# Patient Record
Sex: Female | Born: 1940 | Race: White | Hispanic: No | Smoking: Former smoker
Health system: Southern US, Community
[De-identification: ages and names within clinical notes are randomized; demographics above are authoritative.]

## PROBLEM LIST (undated history)

## (undated) DIAGNOSIS — E785 Hyperlipidemia, unspecified: Secondary | ICD-10-CM

## (undated) DIAGNOSIS — I10 Essential (primary) hypertension: Secondary | ICD-10-CM

## (undated) DIAGNOSIS — Z923 Personal history of irradiation: Secondary | ICD-10-CM

## (undated) DIAGNOSIS — IMO0002 Reserved for concepts with insufficient information to code with codable children: Secondary | ICD-10-CM

## (undated) DIAGNOSIS — R519 Headache, unspecified: Secondary | ICD-10-CM

## (undated) DIAGNOSIS — R87619 Unspecified abnormal cytological findings in specimens from cervix uteri: Secondary | ICD-10-CM

## (undated) DIAGNOSIS — C50919 Malignant neoplasm of unspecified site of unspecified female breast: Secondary | ICD-10-CM

## (undated) DIAGNOSIS — I2699 Other pulmonary embolism without acute cor pulmonale: Secondary | ICD-10-CM

## (undated) DIAGNOSIS — R0600 Dyspnea, unspecified: Secondary | ICD-10-CM

## (undated) DIAGNOSIS — R51 Headache: Secondary | ICD-10-CM

## (undated) DIAGNOSIS — M1990 Unspecified osteoarthritis, unspecified site: Secondary | ICD-10-CM

## (undated) DIAGNOSIS — J449 Chronic obstructive pulmonary disease, unspecified: Secondary | ICD-10-CM

## (undated) DIAGNOSIS — J45909 Unspecified asthma, uncomplicated: Secondary | ICD-10-CM

## (undated) HISTORY — PX: CATARACT EXTRACTION, BILATERAL: SHX1313

## (undated) HISTORY — PX: KNEE CARTILAGE SURGERY: SHX688

## (undated) HISTORY — DX: Hyperlipidemia, unspecified: E78.5

## (undated) HISTORY — DX: Malignant neoplasm of unspecified site of unspecified female breast: C50.919

## (undated) HISTORY — DX: Reserved for concepts with insufficient information to code with codable children: IMO0002

## (undated) HISTORY — DX: Essential (primary) hypertension: I10

## (undated) HISTORY — DX: Chronic obstructive pulmonary disease, unspecified: J44.9

## (undated) HISTORY — DX: Unspecified abnormal cytological findings in specimens from cervix uteri: R87.619

---
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
## 1998-08-21 DIAGNOSIS — C50919 Malignant neoplasm of unspecified site of unspecified female breast: Secondary | ICD-10-CM

## 1998-08-21 DIAGNOSIS — Z923 Personal history of irradiation: Secondary | ICD-10-CM

## 1998-08-21 HISTORY — DX: Personal history of irradiation: Z92.3

## 1998-08-21 HISTORY — PX: BREAST LUMPECTOMY: SHX2

## 1998-08-21 HISTORY — DX: Malignant neoplasm of unspecified site of unspecified female breast: C50.919

## 1999-06-24 ENCOUNTER — Encounter: Payer: Self-pay | Admitting: General Surgery

## 1999-06-24 ENCOUNTER — Ambulatory Visit (HOSPITAL_BASED_OUTPATIENT_CLINIC_OR_DEPARTMENT_OTHER): Admission: RE | Admit: 1999-06-24 | Discharge: 1999-06-24 | Payer: Self-pay | Admitting: General Surgery

## 1999-07-01 ENCOUNTER — Encounter: Payer: Self-pay | Admitting: General Surgery

## 1999-07-05 ENCOUNTER — Ambulatory Visit (HOSPITAL_COMMUNITY): Admission: RE | Admit: 1999-07-05 | Discharge: 1999-07-06 | Payer: Self-pay | Admitting: General Surgery

## 1999-07-05 ENCOUNTER — Encounter: Payer: Self-pay | Admitting: General Surgery

## 1999-07-05 ENCOUNTER — Encounter (INDEPENDENT_AMBULATORY_CARE_PROVIDER_SITE_OTHER): Payer: Self-pay | Admitting: *Deleted

## 1999-07-18 ENCOUNTER — Encounter: Admission: RE | Admit: 1999-07-18 | Discharge: 1999-10-16 | Payer: Self-pay | Admitting: Radiation Oncology

## 2000-01-10 ENCOUNTER — Encounter: Payer: Self-pay | Admitting: General Surgery

## 2000-01-10 ENCOUNTER — Encounter: Admission: RE | Admit: 2000-01-10 | Discharge: 2000-01-10 | Payer: Self-pay | Admitting: General Surgery

## 2000-06-22 ENCOUNTER — Encounter: Admission: RE | Admit: 2000-06-22 | Discharge: 2000-06-22 | Payer: Self-pay | Admitting: General Surgery

## 2000-06-22 ENCOUNTER — Encounter: Payer: Self-pay | Admitting: General Surgery

## 2000-12-03 ENCOUNTER — Encounter: Payer: Self-pay | Admitting: General Surgery

## 2000-12-03 ENCOUNTER — Encounter: Admission: RE | Admit: 2000-12-03 | Discharge: 2000-12-03 | Payer: Self-pay | Admitting: General Surgery

## 2001-06-04 ENCOUNTER — Encounter: Payer: Self-pay | Admitting: General Surgery

## 2001-06-04 ENCOUNTER — Encounter: Admission: RE | Admit: 2001-06-04 | Discharge: 2001-06-04 | Payer: Self-pay | Admitting: General Surgery

## 2001-07-08 DIAGNOSIS — C4491 Basal cell carcinoma of skin, unspecified: Secondary | ICD-10-CM

## 2001-07-08 HISTORY — DX: Basal cell carcinoma of skin, unspecified: C44.91

## 2001-12-04 ENCOUNTER — Encounter: Admission: RE | Admit: 2001-12-04 | Discharge: 2001-12-04 | Payer: Self-pay | Admitting: General Surgery

## 2001-12-04 ENCOUNTER — Encounter: Payer: Self-pay | Admitting: General Surgery

## 2002-12-10 ENCOUNTER — Encounter: Admission: RE | Admit: 2002-12-10 | Discharge: 2002-12-10 | Payer: Self-pay | Admitting: General Surgery

## 2002-12-10 ENCOUNTER — Encounter: Payer: Self-pay | Admitting: General Surgery

## 2003-12-29 ENCOUNTER — Encounter: Admission: RE | Admit: 2003-12-29 | Discharge: 2003-12-29 | Payer: Self-pay | Admitting: General Surgery

## 2005-03-17 ENCOUNTER — Encounter: Admission: RE | Admit: 2005-03-17 | Discharge: 2005-03-17 | Payer: Self-pay | Admitting: General Surgery

## 2006-03-19 ENCOUNTER — Encounter: Admission: RE | Admit: 2006-03-19 | Discharge: 2006-03-19 | Payer: Self-pay | Admitting: General Surgery

## 2007-03-21 ENCOUNTER — Encounter: Admission: RE | Admit: 2007-03-21 | Discharge: 2007-03-21 | Payer: Self-pay | Admitting: General Surgery

## 2008-03-25 ENCOUNTER — Encounter: Admission: RE | Admit: 2008-03-25 | Discharge: 2008-03-25 | Payer: Self-pay | Admitting: *Deleted

## 2009-03-29 ENCOUNTER — Encounter: Admission: RE | Admit: 2009-03-29 | Discharge: 2009-03-29 | Payer: Self-pay | Admitting: *Deleted

## 2010-04-01 ENCOUNTER — Encounter: Admission: RE | Admit: 2010-04-01 | Discharge: 2010-04-01 | Payer: Self-pay | Admitting: *Deleted

## 2011-03-22 ENCOUNTER — Other Ambulatory Visit: Payer: Self-pay | Admitting: *Deleted

## 2011-03-22 DIAGNOSIS — Z1231 Encounter for screening mammogram for malignant neoplasm of breast: Secondary | ICD-10-CM

## 2011-04-06 ENCOUNTER — Ambulatory Visit
Admission: RE | Admit: 2011-04-06 | Discharge: 2011-04-06 | Disposition: A | Discharge status: Home / Self Care | Payer: Medicare Other | Source: Ambulatory Visit | Attending: *Deleted | Admitting: *Deleted

## 2011-04-06 DIAGNOSIS — Z1231 Encounter for screening mammogram for malignant neoplasm of breast: Secondary | ICD-10-CM

## 2011-06-29 ENCOUNTER — Ambulatory Visit: Payer: Medicare Other | Admitting: Physician Assistant

## 2011-06-29 ENCOUNTER — Encounter: Payer: Self-pay | Admitting: *Deleted

## 2011-06-29 VITALS — BP 140/69 | HR 92 | Temp 98.9°F | Ht 64.25 in | Wt 137.0 lb

## 2011-06-29 DIAGNOSIS — R87619 Unspecified abnormal cytological findings in specimens from cervix uteri: Secondary | ICD-10-CM

## 2011-06-29 HISTORY — DX: Unspecified abnormal cytological findings in specimens from cervix uteri: R87.619

## 2011-06-29 NOTE — Progress Notes (Signed)
Pt rescheduled

## 2011-07-06 ENCOUNTER — Ambulatory Visit: Payer: Medicare Other | Admitting: Physician Assistant

## 2011-11-06 ENCOUNTER — Encounter: Payer: Self-pay | Admitting: Cardiovascular Disease

## 2011-11-08 ENCOUNTER — Ambulatory Visit (INDEPENDENT_AMBULATORY_CARE_PROVIDER_SITE_OTHER): Payer: Medicare Other | Admitting: Cardiovascular Disease

## 2011-11-08 ENCOUNTER — Encounter: Payer: Self-pay | Admitting: Cardiovascular Disease

## 2011-11-08 VITALS — BP 106/42 | HR 80 | Resp 14 | Ht 64.0 in | Wt 134.0 lb

## 2011-11-08 DIAGNOSIS — I10 Essential (primary) hypertension: Secondary | ICD-10-CM

## 2011-11-08 DIAGNOSIS — M79622 Pain in left upper arm: Secondary | ICD-10-CM

## 2011-11-08 DIAGNOSIS — C50919 Malignant neoplasm of unspecified site of unspecified female breast: Secondary | ICD-10-CM

## 2011-11-08 DIAGNOSIS — M79609 Pain in unspecified limb: Secondary | ICD-10-CM

## 2011-11-08 DIAGNOSIS — R0789 Other chest pain: Secondary | ICD-10-CM

## 2011-11-08 NOTE — Assessment & Plan Note (Signed)
Are presents with an atypical episode of chest pain/arm pain. The episode was a sharp pain in her left arm that lasted for a few seconds. This occurred off and on for several hours. She has a history of cigarette smoking in the past and has a history of cardiac disease. She's not been exercising regular; no fevers any exertional component.  She has a bad knee and so she doesn't think she'll be able to perform a stress test very well. In my opinion I think it is a very low likelihood that she has any cardiac disease.  We will see her back in the office in 2-3 months. She's had any recurrent episodes of chest pain and we'll proceed with a Lexiscan Myoview study. Otherwise we'll see her on an as-needed basis to

## 2011-11-08 NOTE — Patient Instructions (Signed)
Your physician recommends that you schedule a follow-up appointment in: 2 months or sooner if problems persist.

## 2011-11-08 NOTE — Progress Notes (Signed)
Angela Parrish Date of Birth  10-01-1940 [place]  [id number]    [place]   [place], [place]  [address]    [place], [place]  [address] [phone]  Fax  [phone]  [phone]  Fax [phone]  Problem List: 1. Left arm pain  History of Present Illness:  Angela Parrish is a 71 year old female who presents today for left arm pain. The pain was centered in her upper arm. It occurred spontaneously. It would last for several seconds and then resolve. This occurred off and on for several hours.  The pain 8 days ago and has recurred several times since that time.  She has not done any strenuous exercise since that time.     She works at [employer]. She does use her arms quite a bit during the course of her normal workday but has done the same job for many years and has never had any similar episodes of arm pain.   She has noticed some variable BP.  She eats salty foods and eats fast foods frequently.  She does not get any regular exercise because of knee surgery.  She used to walk 6 months ago.  Current Outpatient Prescriptions  Medication Sig Dispense Refill  . albuterol (PROVENTIL HFA;VENTOLIN HFA) 108 (90 BASE) MCG/ACT inhaler Inhale 2 puffs into the lungs every 6 (six) hours as needed.      Marland Kitchen albuterol (PROVENTIL) (2.5 MG/3ML) 0.083% nebulizer solution Take 2.5 mg by nebulization every 6 (six) hours as needed.      . ALPRAZolam (XANAX) 0.5 MG tablet Take 0.5 mg by mouth at bedtime as needed.      . chlorpheniramine-HYDROcodone (TUSSIONEX) 10-8 MG/5ML LQCR Take 5 mLs by mouth every 12 (twelve) hours as needed.      . fexofenadine-pseudoephedrine (ALLEGRA-D) 60-120 MG per tablet Take 1 tablet by mouth 2 (two) times daily.      Marland Kitchen lisinopril (PRINIVIL,ZESTRIL) 5 MG tablet Take 5 mg by mouth daily.        No Known Allergies  Past Medical History  Diagnosis Date  . Abnormal Pap smear     has had a colposcopy in the past  . Breast  cancer 2000    lumpectomy  . Abnormal Pap smear of cervix 06/29/2011  . Hypertension     Past Surgical History  Procedure Date  . Breast lumpectomy 2000    left breast    History  Smoking status  . Former Smoker  . Types: Cigarettes  . Quit date: 08/14/2007  Smokeless tobacco  . Never Used    History  Alcohol Use No    Family History  Problem Relation Age of Onset  . Cancer Father     lung cancer  . Heart disease Father   . Colon cancer Mother   . Heart disease Mother     Reviw of Systems:  Reviewed in the HPI.  All other systems are negative.  Physical Exam: Blood pressure 106/42, pulse 80, resp. rate 14, height 5\' 4"  (1.626 m), weight 134 lb (60.782 kg). General: Well developed, well nourished, in no acute distress.  Head: Normocephalic, atraumatic, sclera non-icteric, mucus membranes are moist,   Neck: Supple. Carotids are 2 + without bruits. No JVD  Lungs: Clear bilaterally to auscultation.  Heart: regular rate.  normal  S1 S2. No murmurs, gallops or rubs.  Abdomen: Soft, non-tender, non-distended with normal bowel sounds. No hepatomegaly. No rebound/guarding.  No masses.  Msk:  Strength and tone are normal  Extremities: No clubbing or cyanosis. No edema.  Distal pedal pulses are 2+ and equal bilaterally.  Neuro: Alert and oriented X 3. Moves all extremities spontaneously.  Psych:  Responds to questions appropriately with a normal affect.  ECG: NSR., No ST or T wave changes. EKG is completely normal.  Assessment / Plan:

## 2012-01-08 ENCOUNTER — Ambulatory Visit: Payer: Medicare Other | Admitting: Cardiovascular Disease

## 2012-03-06 ENCOUNTER — Other Ambulatory Visit: Payer: Self-pay | Admitting: *Deleted

## 2012-03-06 DIAGNOSIS — Z1231 Encounter for screening mammogram for malignant neoplasm of breast: Secondary | ICD-10-CM

## 2012-04-10 ENCOUNTER — Ambulatory Visit
Admission: RE | Admit: 2012-04-10 | Discharge: 2012-04-10 | Disposition: A | Discharge status: Home / Self Care | Payer: Medicare Other | Source: Ambulatory Visit | Attending: *Deleted | Admitting: *Deleted

## 2012-04-10 DIAGNOSIS — Z1231 Encounter for screening mammogram for malignant neoplasm of breast: Secondary | ICD-10-CM

## 2013-03-04 ENCOUNTER — Other Ambulatory Visit: Payer: Self-pay

## 2013-03-04 DIAGNOSIS — Z1231 Encounter for screening mammogram for malignant neoplasm of breast: Secondary | ICD-10-CM

## 2013-04-11 ENCOUNTER — Ambulatory Visit
Admission: RE | Admit: 2013-04-11 | Discharge: 2013-04-11 | Disposition: A | Discharge status: Home / Self Care | Payer: Medicare Other | Source: Ambulatory Visit

## 2013-04-11 DIAGNOSIS — Z1231 Encounter for screening mammogram for malignant neoplasm of breast: Secondary | ICD-10-CM

## 2014-03-20 ENCOUNTER — Other Ambulatory Visit: Payer: Self-pay

## 2014-03-20 DIAGNOSIS — Z1231 Encounter for screening mammogram for malignant neoplasm of breast: Secondary | ICD-10-CM

## 2014-04-13 ENCOUNTER — Ambulatory Visit
Admission: RE | Admit: 2014-04-13 | Discharge: 2014-04-13 | Disposition: A | Discharge status: Home / Self Care | Payer: Medicare HMO | Source: Ambulatory Visit

## 2014-04-13 ENCOUNTER — Encounter (INDEPENDENT_AMBULATORY_CARE_PROVIDER_SITE_OTHER): Payer: Self-pay

## 2014-04-13 DIAGNOSIS — Z1231 Encounter for screening mammogram for malignant neoplasm of breast: Secondary | ICD-10-CM

## 2014-04-14 ENCOUNTER — Other Ambulatory Visit: Payer: Self-pay | Admitting: *Deleted

## 2014-04-14 DIAGNOSIS — R928 Other abnormal and inconclusive findings on diagnostic imaging of breast: Secondary | ICD-10-CM

## 2014-04-22 ENCOUNTER — Ambulatory Visit
Admission: RE | Admit: 2014-04-22 | Discharge: 2014-04-22 | Disposition: A | Discharge status: Home / Self Care | Payer: Medicare HMO | Source: Ambulatory Visit | Attending: *Deleted | Admitting: *Deleted

## 2014-04-22 DIAGNOSIS — R928 Other abnormal and inconclusive findings on diagnostic imaging of breast: Secondary | ICD-10-CM

## 2014-06-02 ENCOUNTER — Encounter (INDEPENDENT_AMBULATORY_CARE_PROVIDER_SITE_OTHER): Payer: Self-pay | Admitting: *Deleted

## 2014-06-05 ENCOUNTER — Encounter (INDEPENDENT_AMBULATORY_CARE_PROVIDER_SITE_OTHER): Payer: Self-pay

## 2014-06-22 ENCOUNTER — Encounter: Payer: Self-pay | Admitting: Cardiovascular Disease

## 2014-06-25 ENCOUNTER — Other Ambulatory Visit (INDEPENDENT_AMBULATORY_CARE_PROVIDER_SITE_OTHER): Payer: Self-pay | Admitting: *Deleted

## 2014-06-25 ENCOUNTER — Telehealth (INDEPENDENT_AMBULATORY_CARE_PROVIDER_SITE_OTHER): Payer: Self-pay | Admitting: *Deleted

## 2014-06-25 DIAGNOSIS — Z860100 Personal history of colon polyps, unspecified: Secondary | ICD-10-CM

## 2014-06-25 DIAGNOSIS — Z800 Family history of malignant neoplasm of digestive organs: Secondary | ICD-10-CM

## 2014-06-25 DIAGNOSIS — Z1211 Encounter for screening for malignant neoplasm of colon: Secondary | ICD-10-CM

## 2014-06-25 NOTE — Telephone Encounter (Signed)
Patient needs trilyte 

## 2014-06-29 MED ORDER — PEG 3350-KCL-NA BICARB-NACL 420 G PO SOLR: 4000.0000 mL | Freq: Once | ORAL | Status: DC

## 2014-08-04 ENCOUNTER — Encounter (INDEPENDENT_AMBULATORY_CARE_PROVIDER_SITE_OTHER): Payer: Self-pay | Admitting: *Deleted

## 2014-08-18 ENCOUNTER — Telehealth (INDEPENDENT_AMBULATORY_CARE_PROVIDER_SITE_OTHER): Payer: Self-pay | Admitting: *Deleted

## 2014-08-18 NOTE — Telephone Encounter (Signed)
agree

## 2014-08-18 NOTE — Telephone Encounter (Signed)
Referring MD/PCP: angel jones pa (butler)   Procedure: tcs  Reason/Indication:  Fam hx colon ca, hx polyps  Has patient had this procedure before?  Yes, 2010 -- scanned  If so, when, by whom and where?    Is there a family history of colon cancer?  Yes, mother & nephew  Who?  What age when diagnosed?    Is patient diabetic?   no      Does patient have prosthetic heart valve?  no  Do you have a pacemaker?  no  Has patient ever had endocarditis? no  Has patient had joint replacement within last 12 months?  no  Does patient tend to be constipated or take laxatives? no  Is patient on Coumadin, Plavix and/or Aspirin? no  Medications: albuterol, Symbicort inhaler  Allergies: nkda  Medication Adjustment:   Procedure date & time: 09/16/14 at 830

## 2014-09-16 ENCOUNTER — Encounter (HOSPITAL_COMMUNITY)
Admission: RE | Disposition: A | Discharge status: Home / Self Care | Payer: Self-pay | Source: Ambulatory Visit | Attending: Internal Medicine

## 2014-09-16 ENCOUNTER — Ambulatory Visit (HOSPITAL_COMMUNITY)
Admission: RE | Admit: 2014-09-16 | Discharge: 2014-09-16 | Disposition: A | Discharge status: Home / Self Care | Payer: Medicare HMO | Source: Ambulatory Visit | Attending: Internal Medicine | Admitting: Internal Medicine

## 2014-09-16 ENCOUNTER — Encounter (HOSPITAL_COMMUNITY): Payer: Self-pay | Admitting: *Deleted

## 2014-09-16 DIAGNOSIS — K644 Residual hemorrhoidal skin tags: Secondary | ICD-10-CM | POA: Insufficient documentation

## 2014-09-16 DIAGNOSIS — Z87891 Personal history of nicotine dependence: Secondary | ICD-10-CM | POA: Diagnosis not present

## 2014-09-16 DIAGNOSIS — K5730 Diverticulosis of large intestine without perforation or abscess without bleeding: Secondary | ICD-10-CM | POA: Diagnosis not present

## 2014-09-16 DIAGNOSIS — Z853 Personal history of malignant neoplasm of breast: Secondary | ICD-10-CM | POA: Insufficient documentation

## 2014-09-16 DIAGNOSIS — D120 Benign neoplasm of cecum: Secondary | ICD-10-CM | POA: Insufficient documentation

## 2014-09-16 DIAGNOSIS — I10 Essential (primary) hypertension: Secondary | ICD-10-CM | POA: Diagnosis not present

## 2014-09-16 DIAGNOSIS — K649 Unspecified hemorrhoids: Secondary | ICD-10-CM

## 2014-09-16 DIAGNOSIS — Z800 Family history of malignant neoplasm of digestive organs: Secondary | ICD-10-CM | POA: Diagnosis not present

## 2014-09-16 DIAGNOSIS — Z1211 Encounter for screening for malignant neoplasm of colon: Secondary | ICD-10-CM

## 2014-09-16 DIAGNOSIS — Z79899 Other long term (current) drug therapy: Secondary | ICD-10-CM | POA: Insufficient documentation

## 2014-09-16 DIAGNOSIS — Z86010 Personal history of colonic polyps: Secondary | ICD-10-CM

## 2014-09-16 HISTORY — PX: COLONOSCOPY: SHX5424

## 2014-09-16 SURGERY — COLONOSCOPY
Anesthesia: Moderate Sedation

## 2014-09-16 MED ORDER — MEPERIDINE HCL 50 MG/ML IJ SOLN
INTRAMUSCULAR | Status: AC
  Filled 2014-09-16: qty 1

## 2014-09-16 MED ORDER — SODIUM CHLORIDE 0.9 % IV SOLN: INTRAVENOUS | Status: DC

## 2014-09-16 MED ORDER — STERILE WATER FOR IRRIGATION IR SOLN
Status: DC | PRN
  Administered 2014-09-16: 09:00:00

## 2014-09-16 MED ORDER — MEPERIDINE HCL 50 MG/ML IJ SOLN
INTRAMUSCULAR | Status: DC | PRN
  Administered 2014-09-16 (×2): 25 mg via INTRAVENOUS

## 2014-09-16 MED ORDER — MIDAZOLAM HCL 5 MG/5ML IJ SOLN
INTRAMUSCULAR | Status: DC | PRN
  Administered 2014-09-16 (×2): 2 mg via INTRAVENOUS
  Administered 2014-09-16: 1 mg via INTRAVENOUS

## 2014-09-16 MED ORDER — MIDAZOLAM HCL 5 MG/5ML IJ SOLN
INTRAMUSCULAR | Status: AC
  Filled 2014-09-16: qty 10

## 2014-09-16 NOTE — Discharge Instructions (Signed)
Resume usual medications and high fiber diet. No driving for 24 hours. Physician will call with biopsy results.  Colonoscopy, Care After Refer to this sheet in the next few weeks. These instructions provide you with information on caring for yourself after your procedure. Your health care provider may also give you more specific instructions. Your treatment has been planned according to current medical practices, but problems sometimes occur. Call your health care provider if you have any problems or questions after your procedure. WHAT TO EXPECT AFTER THE PROCEDURE  After your procedure, it is typical to have the following:  A small amount of blood in your stool.  Moderate amounts of gas and mild abdominal cramping or bloating. HOME CARE INSTRUCTIONS  Do not drive, operate machinery, or sign important documents for 24 hours.  You may shower and resume your regular physical activities, but move at a slower pace for the first 24 hours.  Take frequent rest periods for the first 24 hours.  Walk around or put a warm pack on your abdomen to help reduce abdominal cramping and bloating.  Drink enough fluids to keep your urine clear or pale yellow.  You may resume your normal diet as instructed by your health care provider. Avoid heavy or fried foods that are hard to digest.  Avoid drinking alcohol for 24 hours or as instructed by your health care provider.  Only take over-the-counter or prescription medicines as directed by your health care provider.  If a tissue sample (biopsy) was taken during your procedure:  Do not take aspirin or blood thinners for 7 days, or as instructed by your health care provider.  Do not drink alcohol for 7 days, or as instructed by your health care provider.  Eat soft foods for the first 24 hours. SEEK MEDICAL CARE IF: You have persistent spotting of blood in your stool 2-3 days after the procedure. SEEK IMMEDIATE MEDICAL CARE IF:  You have more than a  small spotting of blood in your stool.  You pass large blood clots in your stool.  Your abdomen is swollen (distended).  You have nausea or vomiting.  You have a fever.  You have increasing abdominal pain that is not relieved with medicine. Document Released: 03/21/2004 Document Revised: 05/28/2013 Document Reviewed: 04/14/2013 [facility] Patient Information 2015 [place], [place]. This information is not intended to replace advice given to you by your health care provider. Make sure you discuss any questions you have with your health care provider.  High-Fiber Diet Fiber is found in fruits, vegetables, and grains. A high-fiber diet encourages the addition of more whole grains, legumes, fruits, and vegetables in your diet. The recommended amount of fiber for adult males is 38 g per day. For adult females, it is 25 g per day. Pregnant and lactating women should get 28 g of fiber per day. If you have a digestive or bowel problem, ask your caregiver for advice before adding high-fiber foods to your diet. Eat a variety of high-fiber foods instead of only a select few type of foods.  PURPOSE  To increase stool bulk.  To make bowel movements more regular to prevent constipation.  To lower cholesterol.  To prevent overeating. WHEN IS THIS DIET USED?  It may be used if you have constipation and hemorrhoids.  It may be used if you have uncomplicated diverticulosis (intestine condition) and irritable bowel syndrome.  It may be used if you need help with weight management.  It may be used if you want to  add it to your diet as a protective measure against atherosclerosis, diabetes, and cancer. SOURCES OF FIBER  Whole-grain breads and cereals.  Fruits, such as apples, oranges, bananas, berries, prunes, and pears.  Vegetables, such as green peas, carrots, sweet potatoes, beets, broccoli, cabbage, spinach, and artichokes.  Legumes, such split peas, soy, lentils.  Almonds. FIBER CONTENT IN  FOODS Starches and Grains / Dietary Fiber (g)  Cheerios, 1 cup / 3 g  Corn Flakes cereal, 1 cup / 0.7 g  Rice crispy treat cereal, 1 cup / 0.3 g  Instant oatmeal (cooked),  cup / 2 g  Frosted wheat cereal, 1 cup / 5.1 g  Brown, long-grain rice (cooked), 1 cup / 3.5 g  White, long-grain rice (cooked), 1 cup / 0.6 g  Enriched macaroni (cooked), 1 cup / 2.5 g Legumes / Dietary Fiber (g)  Baked beans (canned, plain, or vegetarian),  cup / 5.2 g  Kidney beans (canned),  cup / 6.8 g  Pinto beans (cooked),  cup / 5.5 g Breads and Crackers / Dietary Fiber (g)  Plain or honey graham crackers, 2 squares / 0.7 g  Saltine crackers, 3 squares / 0.3 g  Plain, salted pretzels, 10 pieces / 1.8 g  Whole-wheat bread, 1 slice / 1.9 g  White bread, 1 slice / 0.7 g  Raisin bread, 1 slice / 1.2 g  Plain bagel, 3 oz / 2 g  Flour tortilla, 1 oz / 0.9 g  Corn tortilla, 1 small / 1.5 g  Hamburger or hotdog bun, 1 small / 0.9 g Fruits / Dietary Fiber (g)  Apple with skin, 1 medium / 4.4 g  Sweetened applesauce,  cup / 1.5 g  Banana,  medium / 1.5 g  Grapes, 10 grapes / 0.4 g  Orange, 1 small / 2.3 g  Raisin, 1.5 oz / 1.6 g  Melon, 1 cup / 1.4 g Vegetables / Dietary Fiber (g)  Green beans (canned),  cup / 1.3 g  Carrots (cooked),  cup / 2.3 g  Broccoli (cooked),  cup / 2.8 g  Peas (cooked),  cup / 4.4 g  Mashed potatoes,  cup / 1.6 g  Lettuce, 1 cup / 0.5 g  Corn (canned),  cup / 1.6 g  Tomato,  cup / 1.1 g Document Released: 08/07/2005 Document Revised: 02/06/2012 Document Reviewed: 11/09/2011 [employer] Patient Information 2015 [place], [place]. This information is not intended to replace advice given to you by your health care provider. Make sure you discuss any questions you have with your health care provider.

## 2014-09-16 NOTE — H&P (Signed)
Angela Parrish is an 74 y.o. female.   Chief Complaint: Patient's here for colonoscopy. HPI: Patient is 74 year old Caucasian female who is here for screening colonoscopy. She denies abdominal pain change in bowel habits or rectal bleeding. Last colonoscopy was in October 2010 at [facility] with removal of single small polyp which is hyperplastic. However family history significant for colon carcinoma mother was in her 17s and she diet. Patient's first cousin was diagnosed with an carcinoma in mid 85s and died at age 14(mother's sister's son). Patient's history significant for breast carcinoma and she remains in remission.  Past Medical History  Diagnosis Date  . Abnormal Pap smear     has had a colposcopy in the past  . Breast cancer 2000    lumpectomy  . Abnormal Pap smear of cervix 06/29/2011  . Hypertension     Past Surgical History  Procedure Laterality Date  . Breast lumpectomy  2000    left breast    Family History  Problem Relation Age of Onset  . Cancer Father     lung cancer  . Heart disease Father   . Colon cancer Mother   . Heart disease Mother    Social History:  reports that she quit smoking about 7 years ago. Her smoking use included Cigarettes. She has never used smokeless tobacco. She reports that she does not drink alcohol or use illicit drugs.  Allergies: No Known Allergies  Medications Prior to Admission  Medication Sig Dispense Refill  . albuterol (PROVENTIL HFA;VENTOLIN HFA) 108 (90 BASE) MCG/ACT inhaler Inhale 2 puffs into the lungs every 6 (six) hours as needed for wheezing or shortness of breath.     Marland Kitchen albuterol (PROVENTIL) (2.5 MG/3ML) 0.083% nebulizer solution Take 2.5 mg by nebulization every 6 (six) hours as needed for wheezing or shortness of breath.     . budesonide-formoterol (SYMBICORT) 160-4.5 MCG/ACT inhaler Inhale 2 puffs into the lungs 2 (two) times daily.    Marland Kitchen esomeprazole (NEXIUM) 40 MG capsule Take 40 mg by mouth daily as needed (acid reflux).     . polyethylene glycol-electrolytes (TRILYTE) 420 G solution Take 4,000 mLs by mouth once. 4000 mL 0    No results found for this or any previous visit (from the past 48 hour(s)). No results found.  ROS  Blood pressure 159/65, pulse 73, temperature 97.6 F (36.4 C), temperature source Oral, resp. rate 23, height 5\' 2"  (1.575 m), weight 144 lb (65.318 kg), SpO2 95 %. Physical Exam  Constitutional: She appears well-developed and well-nourished.  HENT:  Mouth/Throat: Oropharynx is clear and moist.  Eyes: Conjunctivae are normal. No scleral icterus.  Neck: No thyromegaly present.  Cardiovascular: Normal rate, regular rhythm and normal heart sounds.   No murmur heard. Respiratory: Effort normal and breath sounds normal.  GI: Soft. She exhibits no distension and no mass. There is no tenderness.  Musculoskeletal: She exhibits no edema.  Lymphadenopathy:    She has no cervical adenopathy.  Neurological: She is alert.  Skin: Skin is warm and dry.     Assessment/Plan High risk screening colonoscopy. Family history of CRC in mother and first cousin.  REHMAN,NAJEEB U 09/16/2014, 9:06 AM

## 2014-09-16 NOTE — Op Note (Signed)
COLONOSCOPY PROCEDURE REPORT  PATIENT:  Angela Parrish  MR#:  [id number] Birthdate:  10-Feb-1941, 74 y.o., female Endoscopist:  Dr. Rogene [place], MD Referred By:  Ms. Particia Nearing, PA-C  Procedure Date: 09/16/2014  Procedure:   Colonoscopy  Indications:  Patient is 74 year old Caucasian female who is undergoing high risk screening colonoscopy. Family history significant for colon carcinoma in her mother who died in her 39s and first cousin who was diagnosed in his mid 59s and died at 83. Patient's last colonoscopy was in October 2010 with removal of small polyp but it was hyperplastic.  Informed Consent:  The procedure and risks were reviewed with the patient and informed consent was obtained.  Medications:  Demerol 50 mg IV Versed 5 mg IV  Description of procedure:  After a digital rectal exam was performed, that colonoscope was advanced from the anus through the rectum and colon to the area of the cecum, ileocecal valve and appendiceal orifice. The cecum was deeply intubated. These structures were well-seen and photographed for the record. From the level of the cecum and ileocecal valve, the scope was slowly and cautiously withdrawn. The mucosal surfaces were carefully surveyed utilizing scope tip to flexion to facilitate fold flattening as needed. The scope was pulled down into the rectum where a thorough exam including retroflexion was performed.  Findings:   Prep satisfactory. Small cecal polyp ablated via cold biopsy. 2 diverticula noted at ascending colon with moderate number of diverticula at sigmoid colon. Normal rectal mucosa. Small hemorrhoids below the dentate line.   Therapeutic/Diagnostic Maneuvers Performed:  See above  Complications:  None  Cecal Withdrawal Time:  9 minutes  Impression:  Examination performed to cecum. Small cecal polyp ablated via cold biopsy. Ascending  and sigmoid diverticulosis. Small external hemorrhoids.  Recommendations:  Standard  instructions given. I will contact patient with biopsy results and further recommendations.  Erza Mothershead U  09/16/2014 9:31 AM  CC: Dr. Terald Sleeper, PA-C & Dr. Rayne Du ref. provider found

## 2014-09-17 ENCOUNTER — Encounter (HOSPITAL_COMMUNITY): Payer: Self-pay | Admitting: Internal Medicine

## 2014-09-23 ENCOUNTER — Encounter (INDEPENDENT_AMBULATORY_CARE_PROVIDER_SITE_OTHER): Payer: Self-pay | Admitting: *Deleted

## 2015-03-19 ENCOUNTER — Other Ambulatory Visit: Payer: Self-pay

## 2015-03-19 DIAGNOSIS — Z1231 Encounter for screening mammogram for malignant neoplasm of breast: Secondary | ICD-10-CM

## 2015-04-27 ENCOUNTER — Ambulatory Visit
Admission: RE | Admit: 2015-04-27 | Discharge: 2015-04-27 | Disposition: A | Discharge status: Home / Self Care | Payer: Medicare HMO | Source: Ambulatory Visit

## 2015-04-27 DIAGNOSIS — Z1231 Encounter for screening mammogram for malignant neoplasm of breast: Secondary | ICD-10-CM

## 2016-03-17 ENCOUNTER — Other Ambulatory Visit: Payer: Self-pay | Admitting: Physician Assistant

## 2016-03-17 ENCOUNTER — Other Ambulatory Visit: Payer: Self-pay | Admitting: Family Medicine

## 2016-03-17 DIAGNOSIS — Z1231 Encounter for screening mammogram for malignant neoplasm of breast: Secondary | ICD-10-CM

## 2016-04-07 ENCOUNTER — Encounter: Payer: Self-pay | Admitting: Physician Assistant

## 2016-04-07 ENCOUNTER — Ambulatory Visit (INDEPENDENT_AMBULATORY_CARE_PROVIDER_SITE_OTHER): Payer: Medicare HMO | Admitting: Physician Assistant

## 2016-04-07 VITALS — BP 120/63 | HR 68 | Temp 97.5°F | Ht 62.0 in | Wt 146.0 lb

## 2016-04-07 DIAGNOSIS — M169 Osteoarthritis of hip, unspecified: Secondary | ICD-10-CM | POA: Insufficient documentation

## 2016-04-07 DIAGNOSIS — J4530 Mild persistent asthma, uncomplicated: Secondary | ICD-10-CM

## 2016-04-07 DIAGNOSIS — Z6826 Body mass index (BMI) 26.0-26.9, adult: Secondary | ICD-10-CM | POA: Diagnosis not present

## 2016-04-07 DIAGNOSIS — C50112 Malignant neoplasm of central portion of left female breast: Secondary | ICD-10-CM

## 2016-04-07 DIAGNOSIS — E785 Hyperlipidemia, unspecified: Secondary | ICD-10-CM

## 2016-04-07 DIAGNOSIS — J410 Simple chronic bronchitis: Secondary | ICD-10-CM

## 2016-04-07 DIAGNOSIS — J42 Unspecified chronic bronchitis: Secondary | ICD-10-CM | POA: Insufficient documentation

## 2016-04-07 DIAGNOSIS — M1611 Unilateral primary osteoarthritis, right hip: Secondary | ICD-10-CM

## 2016-04-07 DIAGNOSIS — J45909 Unspecified asthma, uncomplicated: Secondary | ICD-10-CM | POA: Insufficient documentation

## 2016-04-07 MED ORDER — ALBUTEROL SULFATE HFA 108 (90 BASE) MCG/ACT IN AERS: 2.0000 | INHALATION_SPRAY | Freq: Four times a day (QID) | RESPIRATORY_TRACT | 12 refills | Status: DC | PRN

## 2016-04-07 MED ORDER — BUDESONIDE-FORMOTEROL FUMARATE 160-4.5 MCG/ACT IN AERO: 2.0000 | INHALATION_SPRAY | Freq: Two times a day (BID) | RESPIRATORY_TRACT | 12 refills | Status: DC

## 2016-04-07 MED ORDER — ALBUTEROL SULFATE (2.5 MG/3ML) 0.083% IN NEBU: 2.5000 mg | INHALATION_SOLUTION | Freq: Four times a day (QID) | RESPIRATORY_TRACT | 12 refills | Status: DC | PRN

## 2016-04-07 NOTE — Progress Notes (Signed)
BP 120/63 (BP Location: Left Arm, Patient Position: Sitting, Cuff Size: Normal)   Pulse 68   Temp 97.5 F (36.4 C) (Oral)   Ht 5\' 2"  (1.575 m)   Wt 146 lb (66.2 kg)   BMI 26.70 kg/m    Subjective:    Patient ID: Angela Parrish, female    DOB: [birth year], 75 y.o.   MRN: [id number]  HPI: Angela Parrish is a 75 y.o. female presenting on 04/07/2016 for Hyperlipidemia and Establish care here (Here ar [facility] )   HPI She comes to be established at [facility]. I have seen her for many years at [facility] and then [facility].  She has no new complaints today.  All medication are reviewed and refilled as needed. PMH positive for history of breast cancer, next mammogram at the [facility] in [place] in September.  Also has chronic bronchitis and asthma, DJD/OA, hyperlipidemia.  All records are reviewed.   Relevant past medical, surgical, family and social history reviewed and updated as indicated. Interim medical history since our last visit reviewed. Allergies and medications reviewed and updated.  Review of Systems  Constitutional: Negative.  Negative for activity change, fatigue and fever.  HENT: Negative.   Eyes: Negative.   Respiratory: Negative.  Negative for cough.   Cardiovascular: Negative.  Negative for chest pain.  Gastrointestinal: Negative.  Negative for abdominal pain.  Endocrine: Negative.   Genitourinary: Negative.  Negative for dysuria.  Musculoskeletal: Positive for arthralgias. Negative for joint swelling.  Skin: Negative.   Neurological: Negative.      Per HPI unless specifically indicated above     Medication List       Accurate as of 04/07/16 12:05 PM. Always use your most recent med list.          albuterol (2.5 MG/3ML) 0.083% nebulizer solution Commonly known as:  PROVENTIL Take 3 mLs (2.5 mg total) by nebulization every 6 (six) hours as needed for wheezing or shortness of breath.   albuterol 108 (90 Base) MCG/ACT  inhaler Commonly known as:  PROVENTIL HFA;VENTOLIN HFA Inhale 2 puffs into the lungs every 6 (six) hours as needed for wheezing or shortness of breath. Prefers VENTOLIN   atorvastatin 10 MG tablet Commonly known as:  LIPITOR Take 1 tablet by mouth daily.   budesonide-formoterol 160-4.5 MCG/ACT inhaler Commonly known as:  SYMBICORT Inhale 2 puffs into the lungs 2 (two) times daily.   CENTRUM SILVER 50+WOMEN Tabs Take by mouth.   meloxicam 7.5 MG tablet Commonly known as:  MOBIC Take 1 tablet by mouth daily.   OSTEO BI-FLEX REGULAR STRENGTH PO Take by mouth.          Objective:    BP 120/63 (BP Location: Left Arm, Patient Position: Sitting, Cuff Size: Normal)   Pulse 68   Temp 97.5 F (36.4 C) (Oral)   Ht 5\' 2"  (1.575 m)   Wt 146 lb (66.2 kg)   BMI 26.70 kg/m   Wt Readings from Last 3 Encounters:  04/07/16 146 lb (66.2 kg)  09/16/14 144 lb (65.3 kg)  11/08/11 134 lb (60.8 kg)    Physical Exam  Constitutional: She is oriented to person, place, and time. She appears well-developed and well-nourished.  HENT:  Head: Normocephalic and atraumatic.  Eyes: Conjunctivae and EOM are normal. Pupils are equal, round, and reactive to light.  Neck: Normal range of motion. Neck supple.  Cardiovascular: Normal rate, regular rhythm, normal heart sounds and intact distal pulses.  Pulmonary/Chest: Effort normal and breath sounds normal.  Abdominal: Soft. Bowel sounds are normal.  Neurological: She is alert and oriented to person, place, and time. She has normal reflexes.  Skin: Skin is warm and dry. No rash noted.  Psychiatric: She has a normal mood and affect. Her behavior is normal. Judgment and thought content normal.    No results found for this or any previous visit.    Assessment & Plan:   1. Malignant neoplasm of central portion of left female breast [facility]) History of breast cancer 2007, surgery through Dr. Annamaria Boots. Partial resection and radiation were effective.  2.  Asthma, chronic, mild persistent, uncomplicated Continue meds and trigger avoidance  3. Simple chronic bronchitis (HCC)  - albuterol (PROVENTIL) (2.5 MG/3ML) 0.083% nebulizer solution; Take 3 mLs (2.5 mg total) by nebulization every 6 (six) hours as needed for wheezing or shortness of breath.  Dispense: 360 mL; Refill: 12 - budesonide-formoterol (SYMBICORT) 160-4.5 MCG/ACT inhaler; Inhale 2 puffs into the lungs 2 (two) times daily.  Dispense: 1 Inhaler; Refill: 12 - albuterol (PROVENTIL HFA;VENTOLIN HFA) 108 (90 Base) MCG/ACT inhaler; Inhale 2 puffs into the lungs every 6 (six) hours as needed for wheezing or shortness of breath. Prefers VENTOLIN  Dispense: 1 Inhaler; Refill: 12  4. Hyperlipidemia Low fat low cholesterol diet - atorvastatin (LIPITOR) 10 MG tablet; Take 1 tablet by mouth daily. - Multiple Vitamins-Minerals (CENTRUM SILVER 50+WOMEN) TABS; Take by mouth.  5. Primary osteoarthritis of right hip  - meloxicam (MOBIC) 7.5 MG tablet; Take 1 tablet by mouth daily. - Glucosamine-Chondroitin (OSTEO BI-FLEX REGULAR STRENGTH PO); Take by mouth.  6. Body mass index 26.0-26.9, adult Diet and exercise, walking    Follow up plan: Return in about 3 months (around 07/08/2016) for wellness and labs.  Counseling provided for all of the vaccine components No orders of the defined types were placed in this encounter.   Terald Sleeper PA-C [place] [address]  [place], [place] [address] [phone]   04/07/2016, 12:05 PM

## 2016-04-07 NOTE — Patient Instructions (Signed)
Chronic Bronchitis Chronic bronchitis is a lasting inflammation of the bronchial tubes, which are the tubes that carry air into your lungs. This is inflammation that occurs:   On most days of the week.   For at least three months at a time.   Over a period of two years in a row. When the bronchial tubes are inflamed, they start to produce mucus. The inflammation and buildup of mucus make it more difficult to breathe. Chronic bronchitis is usually a permanent problem and is one type of chronic obstructive pulmonary disease (COPD). People with chronic bronchitis are at greater risk for getting repeated colds, or respiratory infections. CAUSES  Chronic bronchitis most often occurs in people who have:  Long-standing, severe asthma.  A history of smoking.  Asthma and who also smoke. SIGNS AND SYMPTOMS  Chronic bronchitis may cause the following:   A cough that brings up mucus (productive cough).  Shortness of breath.  Early morning headache.  Wheezing.  Chest discomfort.   Recurring respiratory infections. DIAGNOSIS  Your health care provider may confirm the diagnosis by:  Taking your medical history.  Performing a physical exam.  Taking a chest X-ray.   Performing pulmonary function tests. TREATMENT  Treatment involves controlling symptoms with medicines, oxygen therapy, or making lifestyle changes, such as exercising and eating a healthy, well-balanced diet. Medicines could include:  Inhalers to improve air flow in and out of your lungs.  Antibiotics to treat bacterial infections, such as pneumonia, sinus infections, and acute bronchitis. As a preventative measure, your health care provider may recommend routine vaccinations for influenza and pneumonia. This is to prevent infection and hospitalization since you may be more at risk for these types of infections.  HOME CARE INSTRUCTIONS  Take medicines only as directed by your health care provider.   If you smoke  cigarettes, chew tobacco, or use electronic cigarettes, quit. If you need help quitting, ask your health care provider.  Avoid pollen, dust, animal dander, molds, smoke, and other things that cause shortness of breath or wheezing attacks.  Talk to your health care provider about possible exercise routines. Regular exercise is very important to help you feel better.  If you are prescribed oxygen use at home follow these guidelines:  Never smoke while using oxygen. Oxygen does not burn or explode, but flammable materials will burn faster in the presence of oxygen.  Keep a fire extinguisher close by. Let your fire department know that you have oxygen in your home.  Warn visitors not to smoke near you when you are using oxygen. Put up "no smoking" signs in your home where you most often use the oxygen.  Regularly test your smoke detectors at home to make sure they work. If you receive care in your home from a nurse or other health care provider, he or she may also check to make sure your smoke detectors work.  Ask your health care provider whether you would benefit from a pulmonary rehabilitation program.  Do not wait to get medical care if you have any concerning symptoms. Delays could cause permanent injury and may be life threatening. SEEK MEDICAL CARE IF:  You have increased coughing or shortness of breath or both.  You have muscle aches.  You have chest pain.  Your mucus gets thicker.  Your mucus changes from clear or white to yellow, green, gray, or bloody. SEEK IMMEDIATE MEDICAL CARE IF:  Your usual medicines do not stop your wheezing.   You have increased difficulty breathing.     You have any problems with the medicine you are taking, such as a rash, itching, swelling, or trouble breathing. MAKE SURE YOU:   Understand these instructions.  Will watch your condition.  Will get help right away if you are not doing well or get worse.   This information is not intended to  replace advice given to you by your health care provider. Make sure you discuss any questions you have with your health care provider.   Document Released: 05/25/2006 Document Revised: 08/28/2014 Document Reviewed: 09/15/2013 Elsevier Interactive Patient Education 2016 Elsevier Inc.  

## 2016-04-28 ENCOUNTER — Ambulatory Visit
Admission: RE | Admit: 2016-04-28 | Discharge: 2016-04-28 | Disposition: A | Discharge status: Home / Self Care | Payer: Medicare HMO | Source: Ambulatory Visit | Attending: Physician Assistant | Admitting: Physician Assistant

## 2016-04-28 DIAGNOSIS — Z1231 Encounter for screening mammogram for malignant neoplasm of breast: Secondary | ICD-10-CM | POA: Diagnosis not present

## 2016-07-06 ENCOUNTER — Other Ambulatory Visit: Payer: Self-pay | Admitting: Physician Assistant

## 2016-07-06 DIAGNOSIS — M1611 Unilateral primary osteoarthritis, right hip: Secondary | ICD-10-CM

## 2016-07-11 ENCOUNTER — Encounter: Payer: Self-pay | Admitting: Physician Assistant

## 2016-07-11 ENCOUNTER — Ambulatory Visit (INDEPENDENT_AMBULATORY_CARE_PROVIDER_SITE_OTHER): Payer: Commercial Managed Care - HMO | Admitting: Physician Assistant

## 2016-07-11 VITALS — BP 119/69 | HR 76 | Temp 97.0°F | Ht 62.0 in | Wt 149.4 lb

## 2016-07-11 DIAGNOSIS — Z01411 Encounter for gynecological examination (general) (routine) with abnormal findings: Secondary | ICD-10-CM | POA: Diagnosis not present

## 2016-07-11 DIAGNOSIS — Z0000 Encounter for general adult medical examination without abnormal findings: Secondary | ICD-10-CM | POA: Diagnosis not present

## 2016-07-11 DIAGNOSIS — G4700 Insomnia, unspecified: Secondary | ICD-10-CM | POA: Insufficient documentation

## 2016-07-11 DIAGNOSIS — E785 Hyperlipidemia, unspecified: Secondary | ICD-10-CM | POA: Diagnosis not present

## 2016-07-11 DIAGNOSIS — Z23 Encounter for immunization: Secondary | ICD-10-CM | POA: Diagnosis not present

## 2016-07-11 DIAGNOSIS — I10 Essential (primary) hypertension: Secondary | ICD-10-CM | POA: Diagnosis not present

## 2016-07-11 MED ORDER — MELOXICAM 7.5 MG PO TABS: 7.5000 mg | ORAL_TABLET | Freq: Every day | ORAL | 3 refills | Status: DC

## 2016-07-11 MED ORDER — TRAZODONE HCL 50 MG PO TABS: 50.0000 mg | ORAL_TABLET | Freq: Every day | ORAL | 11 refills | Status: DC

## 2016-07-11 NOTE — Progress Notes (Addendum)
BP 119/69   Pulse 76   Temp 97 F (36.1 C) (Oral)   Ht 5' 2"  (1.575 m)   Wt 149 lb 6.4 oz (67.8 kg)   BMI 27.33 kg/m    Subjective:    Patient ID: Angela Parrish, female    DOB: 29-Feb-1941, 75 y.o.   MRN: [id number]  Angela Parrish is a 75 y.o. female presenting on 07/11/2016 for Annual Exam  HPI  This patient comes in for her annual exam in gynecology exam. All of her medical history is reviewed. She is not having any significant problems at this time. She is positive for COPD,  Mammogram was normal this year.  Some abnormal skin growths of minor concern. Has been to Dr, Denna Haggard in the past for evaluation.  Past Medical History:  Diagnosis Date  . Abnormal Pap smear    has had a colposcopy in the past  . Abnormal Pap smear of cervix 06/29/2011  . Breast cancer ([place]) 2000   lumpectomy  . COPD (chronic obstructive pulmonary disease) ([place])   . Hypertension    Relevant past medical, surgical, family and social history reviewed and updated as indicated. Interim medical history since our last visit reviewed. Allergies and medications reviewed and updated.   Data reviewed from any sources in EPIC.  Review of Systems  Constitutional: Negative.  Negative for activity change, fatigue and fever.  HENT: Negative.   Eyes: Negative.   Respiratory: Negative.  Negative for cough.   Cardiovascular: Negative.  Negative for chest pain.  Gastrointestinal: Negative.  Negative for abdominal pain.  Endocrine: Negative.   Genitourinary: Negative.  Negative for dysuria.  Musculoskeletal: Negative.   Skin: Negative.   Neurological: Negative.      Social History   Social History  . Marital status: Widowed    Spouse name: N/A  . Number of children: N/A  . Years of education: N/A   Occupational History  . Not on file.   Social History Main Topics  . Smoking status: Former Smoker    Types: Cigarettes    Quit date: 08/14/2007  . Smokeless tobacco: Never Used  . Alcohol use No  . Drug  use: No  . Sexual activity: Yes    Birth control/ protection: Post-menopausal   Other Topics Concern  . Not on file   Social History Narrative  . No narrative on file    Past Surgical History:  Procedure Laterality Date  . BREAST LUMPECTOMY  2000   left breast  . COLONOSCOPY N/A 09/16/2014   Procedure: COLONOSCOPY;  Surgeon: Rogene Houston, MD;  [facility];  Service: Endoscopy;  Laterality: N/A;  830 - moved to 9:15 - Ann to notify    Family History  Problem Relation Age of Onset  . Cancer Father     lung cancer  . Heart disease Father   . Colon cancer Mother   . Heart disease Mother       Medication List       Accurate as of 07/11/16  2:03 PM. Always use your most recent med list.          albuterol (2.5 MG/3ML) 0.083% nebulizer solution Commonly known as:  PROVENTIL Take 3 mLs (2.5 mg total) by nebulization every 6 (six) hours as needed for wheezing or shortness of breath.   albuterol 108 (90 Base) MCG/ACT inhaler Commonly known as:  PROVENTIL HFA;VENTOLIN HFA Inhale 2 puffs into the lungs every 6 (six) hours as needed for  wheezing or shortness of breath. Prefers VENTOLIN   atorvastatin 10 MG tablet Commonly known as:  LIPITOR Take 1 tablet by mouth daily.   budesonide-formoterol 160-4.5 MCG/ACT inhaler Commonly known as:  SYMBICORT Inhale 2 puffs into the lungs 2 (two) times daily.   CENTRUM SILVER 50+WOMEN Tabs Take by mouth.   meloxicam 7.5 MG tablet Commonly known as:  MOBIC Take 1 tablet (7.5 mg total) by mouth daily.   OSTEO BI-FLEX REGULAR STRENGTH PO Take by mouth.   traZODone 50 MG tablet Commonly known as:  DESYREL Take 1-2 tablets (50-100 mg total) by mouth at bedtime.          Objective:    BP 119/69   Pulse 76   Temp 97 F (36.1 C) (Oral)   Ht 5' 2"  (1.575 m)   Wt 149 lb 6.4 oz (67.8 kg)   BMI 27.33 kg/m   No Known Allergies Wt Readings from Last 3 Encounters:  07/11/16 149 lb 6.4 oz (67.8 kg)  04/07/16  146 lb (66.2 kg)  09/16/14 144 lb (65.3 kg)    Physical Exam  Constitutional: She is oriented to person, place, and time. She appears well-developed and well-nourished.  HENT:  Head: Normocephalic and atraumatic.  Eyes: Conjunctivae and EOM are normal. Pupils are equal, round, and reactive to light.  Neck: Normal range of motion. Neck supple.  Cardiovascular: Normal rate, regular rhythm, normal heart sounds and intact distal pulses.   Pulmonary/Chest: Effort normal and breath sounds normal. Right breast exhibits no mass, no skin change and no tenderness. Left breast exhibits no mass, no skin change and no tenderness. Breasts are symmetrical.    No nipple on left breast this was removed in surgery. Scar is well-healed.  Abdominal: Soft. Bowel sounds are normal.  Genitourinary: Vagina normal and uterus normal. Rectal exam shows no fissure. No breast swelling, tenderness, discharge or bleeding. There is no tenderness or lesion on the right labia. There is no tenderness or lesion on the left labia. Uterus is not deviated, not enlarged and not tender. Cervix exhibits no motion tenderness, no discharge and no friability. Right adnexum displays no mass, no tenderness and no fullness. Left adnexum displays no mass, no tenderness and no fullness. No tenderness or bleeding in the vagina. No vaginal discharge found.  Neurological: She is alert and oriented to person, place, and time. She has normal reflexes.  Skin: Skin is warm and dry. No rash noted.  Psychiatric: She has a normal mood and affect. Her behavior is normal. Judgment and thought content normal.        Assessment & Plan:   1. Wellness examination - CMP14+EGFR - Lipid panel - TSH - CBC with Differential/Platelet  2. Encounter for gynecological examination with abnormal finding Breast surgery from breast cancer  Continue all other maintenance medications as listed above. Educational handout given for epidermal cyst  Follow up  plan: Return in about 6 months (around 01/08/2017).  Terald Sleeper PA-C [facility] [address]  [place], [place] [address] [phone]   07/11/2016, 2:03 PM

## 2016-07-11 NOTE — Patient Instructions (Signed)
Epidermal Cyst An epidermal cyst is usually a small, painless lump under the skin. Cysts often occur on the face, neck, stomach, chest, or genitals. The cyst may be filled with a bad smelling paste. Do not pop your cyst. Popping the cyst can cause pain and puffiness (swelling). HOME CARE   Only take medicines as told by your doctor.  Take your medicine (antibiotics) as told. Finish it even if you start to feel better. GET HELP RIGHT AWAY IF:  Your cyst is tender, red, or puffy.  You are not getting better, or you are getting worse.  You have any questions or concerns. MAKE SURE YOU:  Understand these instructions.  Will watch your condition.  Will get help right away if you are not doing well or get worse. This information is not intended to replace advice given to you by your health care provider. Make sure you discuss any questions you have with your health care provider. Document Released: 09/14/2004 Document Revised: 02/06/2012 Document Reviewed: 06/09/2015 Elsevier Interactive Patient Education  2017 [employer].

## 2016-07-12 LAB — CMP14+EGFR
A/G RATIO: 1.4 (ref 1.2–2.2)
ALT: 17 IU/L (ref 0–32)
AST: 20 IU/L (ref 0–40)
Albumin: 4.5 g/dL (ref 3.5–4.8)
Alkaline Phosphatase: 95 IU/L (ref 39–117)
BUN / CREAT RATIO: 19 (ref 12–28)
BUN: 26 mg/dL (ref 8–27)
Bilirubin Total: 0.6 mg/dL (ref 0.0–1.2)
CALCIUM: 9.8 mg/dL (ref 8.7–10.3)
CO2: 25 mmol/L (ref 18–29)
CREATININE: 1.34 mg/dL — AB (ref 0.57–1.00)
Chloride: 96 mmol/L (ref 96–106)
GFR calc Af Amer: 44 mL/min/{1.73_m2} — ABNORMAL LOW (ref >59)
GFR, EST NON AFRICAN AMERICAN: 39 mL/min/{1.73_m2} — AB (ref >59)
GLOBULIN, TOTAL: 3.3 g/dL (ref 1.5–4.5)
Glucose: 84 mg/dL (ref 65–99)
POTASSIUM: 4.7 mmol/L (ref 3.5–5.2)
Sodium: 139 mmol/L (ref 134–144)
Total Protein: 7.8 g/dL (ref 6.0–8.5)

## 2016-07-12 LAB — CBC WITH DIFFERENTIAL/PLATELET
BASOS ABS: 0.1 10*3/uL (ref 0.0–0.2)
Basos: 1 %
EOS (ABSOLUTE): 0.4 10*3/uL (ref 0.0–0.4)
Eos: 4 %
Hematocrit: 39.1 % (ref 34.0–46.6)
Hemoglobin: 13.2 g/dL (ref 11.1–15.9)
IMMATURE GRANS (ABS): 0.1 10*3/uL (ref 0.0–0.1)
IMMATURE GRANULOCYTES: 1 %
LYMPHS: 20 %
Lymphocytes Absolute: 2 10*3/uL (ref 0.7–3.1)
MCH: 31 pg (ref 26.6–33.0)
MCHC: 33.8 g/dL (ref 31.5–35.7)
MCV: 92 fL (ref 79–97)
MONOS ABS: 0.8 10*3/uL (ref 0.1–0.9)
Monocytes: 8 %
NEUTROS PCT: 66 %
Neutrophils Absolute: 7 10*3/uL (ref 1.4–7.0)
PLATELETS: 261 10*3/uL (ref 150–379)
RBC: 4.26 x10E6/uL (ref 3.77–5.28)
RDW: 12.8 % (ref 12.3–15.4)
WBC: 10.3 10*3/uL (ref 3.4–10.8)

## 2016-07-12 LAB — LIPID PANEL
CHOL/HDL RATIO: 2.2 ratio (ref 0.0–4.4)
Cholesterol, Total: 206 mg/dL — ABNORMAL HIGH (ref 100–199)
HDL: 94 mg/dL (ref >39)
LDL CALC: 91 mg/dL (ref 0–99)
Triglycerides: 107 mg/dL (ref 0–149)
VLDL Cholesterol Cal: 21 mg/dL (ref 5–40)

## 2016-07-12 LAB — TSH: TSH: 4.07 u[IU]/mL (ref 0.450–4.500)

## 2016-07-14 ENCOUNTER — Other Ambulatory Visit: Payer: Self-pay | Admitting: *Deleted

## 2016-07-14 DIAGNOSIS — R7989 Other specified abnormal findings of blood chemistry: Secondary | ICD-10-CM

## 2016-08-10 ENCOUNTER — Other Ambulatory Visit: Payer: Commercial Managed Care - HMO

## 2016-08-10 DIAGNOSIS — R7989 Other specified abnormal findings of blood chemistry: Secondary | ICD-10-CM | POA: Diagnosis not present

## 2016-08-11 LAB — BASIC METABOLIC PANEL
BUN/Creatinine Ratio: 18 (ref 12–28)
BUN: 23 mg/dL (ref 8–27)
CALCIUM: 10.3 mg/dL (ref 8.7–10.3)
CO2: 23 mmol/L (ref 18–29)
Chloride: 100 mmol/L (ref 96–106)
Creatinine, Ser: 1.26 mg/dL — ABNORMAL HIGH (ref 0.57–1.00)
GFR, EST AFRICAN AMERICAN: 48 mL/min/{1.73_m2} — AB (ref >59)
GFR, EST NON AFRICAN AMERICAN: 41 mL/min/{1.73_m2} — AB (ref >59)
Glucose: 91 mg/dL (ref 65–99)
Potassium: 4.5 mmol/L (ref 3.5–5.2)
Sodium: 141 mmol/L (ref 134–144)

## 2016-08-16 ENCOUNTER — Other Ambulatory Visit: Payer: Self-pay | Admitting: *Deleted

## 2016-08-16 DIAGNOSIS — N289 Disorder of kidney and ureter, unspecified: Secondary | ICD-10-CM

## 2016-09-19 ENCOUNTER — Other Ambulatory Visit: Payer: Medicare HMO

## 2016-09-19 DIAGNOSIS — R7989 Other specified abnormal findings of blood chemistry: Secondary | ICD-10-CM

## 2016-09-19 DIAGNOSIS — N289 Disorder of kidney and ureter, unspecified: Secondary | ICD-10-CM | POA: Diagnosis not present

## 2016-09-20 LAB — MICROALBUMIN / CREATININE URINE RATIO
Creatinine, Urine: 34.8 mg/dL
MICROALBUM., U, RANDOM: 6.4 ug/mL
Microalb/Creat Ratio: 18.4 mg/g creat (ref 0.0–30.0)

## 2016-09-20 LAB — BASIC METABOLIC PANEL
BUN/Creatinine Ratio: 19 (ref 12–28)
BUN: 25 mg/dL (ref 8–27)
CO2: 24 mmol/L (ref 18–29)
Calcium: 9.7 mg/dL (ref 8.7–10.3)
Chloride: 100 mmol/L (ref 96–106)
Creatinine, Ser: 1.3 mg/dL — ABNORMAL HIGH (ref 0.57–1.00)
GFR calc Af Amer: 46 mL/min/{1.73_m2} — ABNORMAL LOW (ref >59)
GFR calc non Af Amer: 40 mL/min/{1.73_m2} — ABNORMAL LOW (ref >59)
GLUCOSE: 104 mg/dL — AB (ref 65–99)
Potassium: 4.8 mmol/L (ref 3.5–5.2)
SODIUM: 140 mmol/L (ref 134–144)

## 2016-09-22 NOTE — Progress Notes (Signed)
Patient aware.

## 2016-10-02 ENCOUNTER — Other Ambulatory Visit: Payer: Self-pay | Admitting: Physician Assistant

## 2016-10-02 DIAGNOSIS — E785 Hyperlipidemia, unspecified: Secondary | ICD-10-CM

## 2016-10-03 ENCOUNTER — Ambulatory Visit (INDEPENDENT_AMBULATORY_CARE_PROVIDER_SITE_OTHER): Payer: Medicare HMO | Admitting: Physician Assistant

## 2016-10-03 ENCOUNTER — Encounter: Payer: Self-pay | Admitting: Physician Assistant

## 2016-10-03 VITALS — BP 114/62 | HR 77 | Temp 97.3°F | Ht 62.0 in | Wt 148.4 lb

## 2016-10-03 DIAGNOSIS — G5701 Lesion of sciatic nerve, right lower limb: Secondary | ICD-10-CM

## 2016-10-03 MED ORDER — METHYLPREDNISOLONE ACETATE 80 MG/ML IJ SUSP
80.0000 mg | Freq: Once | INTRAMUSCULAR | Status: AC
  Administered 2016-10-03: 80 mg via INTRAMUSCULAR

## 2016-10-03 NOTE — Progress Notes (Signed)
BP 114/62   Pulse 77   Temp 97.3 F (36.3 C) (Oral)   Ht 5\' 2"  (1.575 m)   Wt 148 lb 6.4 oz (67.3 kg)   BMI 27.14 kg/m    Subjective:    Patient ID: Angela Parrish, female    DOB: [birth year], 76 y.o.   MRN: [id number]  HPI: Angela Parrish is a 76 y.o. female presenting on 10/03/2016 for Flank Pain (Pain in right side and back )  Patient has pain in the right lateral hip for several weeks. She does not know of any injury. She has never had any arthritis diagnosed there.  Pain is worse with extension and flexion. After she feels okay. When she lays on her side at night it hurts.  Relevant past medical, surgical, family and social history reviewed and updated as indicated. Allergies and medications reviewed and updated.  Past Medical History:  Diagnosis Date  . Abnormal Pap smear    has had a colposcopy in the past  . Abnormal Pap smear of cervix 06/29/2011  . Breast cancer ([place]) 2000   lumpectomy LEFT  . COPD (chronic obstructive pulmonary disease) ([place])   . Hypertension     Past Surgical History:  Procedure Laterality Date  . BREAST LUMPECTOMY  2000   left breast  . COLONOSCOPY N/A 09/16/2014   Procedure: COLONOSCOPY;  Surgeon: Rogene Houston, MD;  [facility];  Service: Endoscopy;  Laterality: N/A;  830 - moved to 9:15 - Ann to notify    Review of Systems  Constitutional: Negative.  Negative for activity change, fatigue and fever.  HENT: Negative.   Eyes: Negative.   Respiratory: Negative.  Negative for cough.   Cardiovascular: Negative.  Negative for chest pain.  Gastrointestinal: Negative.  Negative for abdominal pain.  Endocrine: Negative.   Genitourinary: Negative.  Negative for dysuria.  Musculoskeletal: Positive for arthralgias and back pain. Negative for gait problem.  Skin: Negative.   Neurological: Negative.     Allergies as of 10/03/2016   No Known Allergies     Medication List       Accurate as of 10/03/16  6:40 PM. Always use your most  recent med list.          albuterol (2.5 MG/3ML) 0.083% nebulizer solution Commonly known as:  PROVENTIL Take 3 mLs (2.5 mg total) by nebulization every 6 (six) hours as needed for wheezing or shortness of breath.   albuterol 108 (90 Base) MCG/ACT inhaler Commonly known as:  PROVENTIL HFA;VENTOLIN HFA Inhale 2 puffs into the lungs every 6 (six) hours as needed for wheezing or shortness of breath. Prefers VENTOLIN   atorvastatin 10 MG tablet Commonly known as:  LIPITOR TAKE ONE (1) TABLET EACH DAY   budesonide-formoterol 160-4.5 MCG/ACT inhaler Commonly known as:  SYMBICORT Inhale 2 puffs into the lungs 2 (two) times daily.   Fish Oil 1200 MG Caps Take by mouth.   OSTEO BI-FLEX REGULAR STRENGTH PO Take by mouth.   traZODone 50 MG tablet Commonly known as:  DESYREL Take 1-2 tablets (50-100 mg total) by mouth at bedtime.          Objective:    BP 114/62   Pulse 77   Temp 97.3 F (36.3 C) (Oral)   Ht 5\' 2"  (1.575 m)   Wt 148 lb 6.4 oz (67.3 kg)   BMI 27.14 kg/m   No Known Allergies  Physical Exam  Constitutional: She is oriented to person, place, and time.  She appears well-developed and well-nourished.  HENT:  Head: Normocephalic and atraumatic.  Eyes: Conjunctivae and EOM are normal. Pupils are equal, round, and reactive to light.  Cardiovascular: Normal rate, regular rhythm, normal heart sounds and intact distal pulses.   Pulmonary/Chest: Effort normal and breath sounds normal.  Abdominal: Soft. Bowel sounds are normal.  Musculoskeletal: She exhibits tenderness. She exhibits no deformity.  Neurological: She is alert and oriented to person, place, and time. She has normal reflexes.  Skin: Skin is warm and dry. No rash noted.  Psychiatric: She has a normal mood and affect. Her behavior is normal. Judgment and thought content normal.        Assessment & Plan:   1. Piriformis syndrome of right side - methylPREDNISolone acetate (DEPO-MEDROL) injection 80 mg;  Inject 1 mL (80 mg total) into the muscle once.   Continue all other maintenance medications as listed above.  Follow up plan: No Follow-up on file.  Educational handout given for piriformis syndrome  Terald Sleeper PA-C [facility] [address]  [place],  [address] [phone]   10/03/2016, 6:40 PM

## 2016-10-03 NOTE — Patient Instructions (Addendum)
Piriformis Syndrome Rehab Ask your health care provider which exercises are safe for you. Do exercises exactly as told by your health care provider and adjust them as directed. It is normal to feel mild stretching, pulling, tightness, or discomfort as you do these exercises, but you should stop right away if you feel sudden pain or your pain gets worse.Do not begin these exercises until told by your health care provider. Stretching and range of motion exercises These exercises warm up your muscles and joints and improve the movement and flexibility of your hip and pelvis. These exercises also help to relieve pain, numbness, and tingling. Exercise A: Hip rotators Lie on your back on a firm surface. Pull your left / right knee toward your same shoulder with your left / right hand until your knee is pointing toward the ceiling. Hold your left / right ankle with your other hand. Keeping your knee steady, gently pull your left / right ankle toward your other shoulder until you feel a stretch in your buttocks. Hold this position for __________ seconds. Repeat __________ times. Complete this stretch __________ times a day. Exercise B: Hip extensors Lie on your back on a firm surface. Both of your legs should be straight. Pull your left / right knee to your chest. Hold your leg in this position by holding onto the back of your thigh or the front of your knee. Hold this position for __________ seconds. Slowly return to the starting position. Repeat __________ times. Complete this stretch __________ times a day. Strengthening exercises These exercises build strength and endurance in your hip and thigh muscles. Endurance is the ability to use your muscles for a long time, even after they get tired. Exercise C: Straight leg raises (hip abductors) Lie on your side with your left / right leg in the top position. Lie so your head, shoulder, knee, and hip line up. Bend your bottom knee to help you balance. Lift  your top leg up 4-6 inches (10-15 cm), keeping your toes pointed straight ahead. Hold this position for __________ seconds. Slowly lower your leg to the starting position. Let your muscles relax completely. Repeat __________ times. Complete this exercise__________ times a day. Exercise D: Hip abductors and rotators, quadruped Get on your hands and knees on a firm, lightly padded surface. Your hands should be directly below your shoulders, and your knees should be directly below your hips. Lift your left / right knee out to the side. Keep your knee bent. Do not twist your body. Hold this position for __________ seconds. Slowly lower your leg. Repeat __________ times. Complete this exercise__________ times a day. Exercise E: Straight leg raises (hip extensors) Lie on your abdomen on a bed or a firm surface with a pillow under your hips. Squeeze your buttock muscles and lift your left / right thigh off the bed. Do not let your back arch. Hold this position for __________ seconds. Slowly return to the starting position. Let your muscles relax completely before doing another repetition. Repeat __________ times. Complete this exercise__________ times a day. This information is not intended to replace advice given to you by your health care provider. Make sure you discuss any questions you have with your health care provider. Document Released: 08/07/2005 Document Revised: 04/11/2016 Document Reviewed: 07/20/2015 Elsevier Interactive Patient Education  2017 [employer] Inc[employer] Piriformis Syndrome Piriformis syndrome is a condition that can cause pain and numbness in your buttocks and down the back of your leg. Piriformis syndrome happens when the small muscle  that connects the base of your spine to your hip (piriformis muscle) presses on the nerve that runs down the back of your leg (sciatic nerve). The piriformis muscle helps your hip rotate and helps to bring your leg back and out. It also helps shift  your weight while you are walking to keep you stable. The sciatic nerve runs under or through the piriformis. Damage to the piriformis muscle can cause spasms that put pressure on the nerve below. This causes pain and discomfort while sitting and moving. The pain may feel as if it begins in the buttock and spreads (radiates) down your hip and thigh. What are the causes? This condition is caused by pressure on the sciatic nerve from the piriformis muscle. The piriformis muscle can get irritated with overuse, especially if other hip muscles are weak and the piriformis has to do extra work. Piriformis syndrome can also occur after an injury, like a fall onto your buttocks. What increases the risk? This condition is more likely to develop in:  Women.  People who sit for long periods of time.  Cyclists.  People who have weak buttocks muscles (gluteal muscles). What are the signs or symptoms? Pain, tingling, or numbness that starts in the buttock and runs down the back of your leg (sciatica) is the most common symptom of this condition. Your symptoms may:  Get worse the longer you sit.  Get worse when you walk, run, or go up on stairs. How is this diagnosed? This condition is diagnosed based on your symptoms, medical history, and physical exam. During this exam, your health care provider may move your leg into different positions to check for pain. He or she will also press on the muscles of your hip and buttock to see if that increases your symptoms. You may also have an X-ray or MRI. How is this treated? Treatment for this condition may include:  Stopping all activities that cause pain or make your condition worse.  Using heat or ice to relieve pain as told by your health care provider.  Taking medicines to reduce pain and swelling.  Taking a muscle relaxer to release the piriformis muscle.  Doing range-of-motion and strengthening exercises (physical therapy) as told by your health care  provider.  Massaging the affected area.  Getting an injection of an anti-inflammatory medicine or muscle relaxer to reduce inflammation and muscle tension. In rare cases, you may need surgery to cut the muscle and release pressure on the nerve if other treatments do not work. Follow these instructions at home:  Take over-the-counter and prescription medicines only as told by your health care provider.  Do not sit for long periods. Get up and walk around every 20 minutes or as often as told by your health care provider.  If directed, apply heat to the affected area as often as told by your health care provider. Use the heat source that your health care provider recommends, such as a moist heat pack or a heating pad.  Place a towel between your skin and the heat source.  Leave the heat on for 20-30 minutes.  Remove the heat if your skin turns bright red. This is especially important if you are unable to feel pain, heat, or cold. You may have a greater risk of getting burned.  If directed, apply ice to the injured area.  Put ice in a plastic bag.  Place a towel between your skin and the bag.  Leave the ice on for 20 minutes,  2-3 times a day.  Do exercises as told by your health care provider.  Return to your normal activities as told by your health care provider. Ask your health care provider what activities are safe for you.  Keep all follow-up visits as told by your health care provider. This is important. How is this prevented?  Do not sit for longer than 20 minutes at a time. When you sit, choose padded surfaces.  Warm up and stretch before being active.  Cool down and stretch after being active.  Give your body time to rest between periods of activity.  Make sure to use equipment that fits you.  Maintain physical fitness, including:  Strength.  Flexibility. Contact a health care provider if:  Your pain and stiffness continue or get worse.  Your leg or hip  becomes weak.  You have changes in your bowel function or bladder function. This information is not intended to replace advice given to you by your health care provider. Make sure you discuss any questions you have with your health care provider. Document Released: 08/07/2005 Document Revised: 04/11/2016 Document Reviewed: 07/20/2015 Elsevier Interactive Patient Education  2017 [employer].

## 2016-11-22 ENCOUNTER — Ambulatory Visit (INDEPENDENT_AMBULATORY_CARE_PROVIDER_SITE_OTHER): Payer: Medicare HMO | Admitting: *Deleted

## 2016-11-22 VITALS — BP 116/71 | HR 69 | Temp 97.1°F | Ht 62.0 in | Wt 148.0 lb

## 2016-11-22 DIAGNOSIS — Z0000 Encounter for general adult medical examination without abnormal findings: Secondary | ICD-10-CM | POA: Diagnosis not present

## 2016-11-22 DIAGNOSIS — Z23 Encounter for immunization: Secondary | ICD-10-CM

## 2016-11-22 NOTE — Progress Notes (Addendum)
Subjective:   Angela Parrish is a 76 y.o. female who presents for an Initial Medicare Annual Wellness Visit.  Patient was here today for Medicare Annual Wellness exam. She is a 76 year old female that enjoys crafting which includes painting pallets and making flower arrangements. She worked for [employer] for 43 years and this is where she retired. She currently does not have a exercise regimen but states that since the weather is warming up she will start walking again. She states that she eats 3 semi healthy meals a day. She lives at home with her friend and she has 1 daughter. Fall hazards were discussed today and she is aware of stair and rug safety and she does not have any pets in the home. Patient does have stairs in the home but she does not usually have to climb them as she does not use the upstairs to her house. She states that her health is about the same as it was a year ago.     Objective:    Today's Vitals   11/22/16 0910  BP: 116/71  Pulse: 69  Temp: 97.1 F (36.2 C)  TempSrc: Oral  Weight: 148 lb (67.1 kg)  Height: 5\' 2"  (1.575 m)   Body mass index is 27.07 kg/m.   Current Medications (verified) Outpatient Encounter Prescriptions as of 11/22/2016  Medication Sig  . albuterol (PROVENTIL HFA;VENTOLIN HFA) 108 (90 Base) MCG/ACT inhaler Inhale 2 puffs into the lungs every 6 (six) hours as needed for wheezing or shortness of breath. Prefers VENTOLIN  . albuterol (PROVENTIL) (2.5 MG/3ML) 0.083% nebulizer solution Take 3 mLs (2.5 mg total) by nebulization every 6 (six) hours as needed for wheezing or shortness of breath.  Marland Kitchen atorvastatin (LIPITOR) 10 MG tablet TAKE ONE (1) TABLET EACH DAY  . budesonide-formoterol (SYMBICORT) 160-4.5 MCG/ACT inhaler Inhale 2 puffs into the lungs 2 (two) times daily.  . Glucosamine-Chondroitin (OSTEO BI-FLEX REGULAR STRENGTH PO) Take by mouth.  . Multiple Vitamins-Minerals (WOMENS MULTI VITAMIN & MINERAL PO) Take by mouth.  . Omega-3 Fatty Acids  (FISH OIL) 1200 MG CAPS Take by mouth.  . traZODone (DESYREL) 50 MG tablet Take 1-2 tablets (50-100 mg total) by mouth at bedtime.  . Turmeric 500 MG TABS Take by mouth.   No facility-administered encounter medications on file as of 11/22/2016.     Allergies (verified) Patient has no known allergies.   History: Past Medical History:  Diagnosis Date  . Abnormal Pap smear    has had a colposcopy in the past  . Abnormal Pap smear of cervix 06/29/2011  . Breast cancer ([place]) 2000   lumpectomy LEFT  . COPD (chronic obstructive pulmonary disease) ([place])   . Hyperlipidemia    Past Surgical History:  Procedure Laterality Date  . BREAST LUMPECTOMY  2000   left breast  . CATARACT EXTRACTION, BILATERAL    . COLONOSCOPY N/A 09/16/2014   Procedure: COLONOSCOPY;  Surgeon: Rogene Houston, MD;  [facility];  Service: Endoscopy;  Laterality: N/A;  830 - moved to 9:15 - Ann to notify  . KNEE CARTILAGE SURGERY Right    Family History  Problem Relation Age of Onset  . Cancer Father     lung cancer  . Heart disease Father   . Colon cancer Mother   . Heart disease Sister   . Cancer Brother     Leukemia  . Hypertension Brother   . Heart attack Brother   . Obesity Daughter   .  Hypertension Brother    Social History   Occupational History  . Not on file.   Social History Main Topics  . Smoking status: Former Smoker    Types: Cigarettes    Quit date: 08/14/2007  . Smokeless tobacco: Never Used  . Alcohol use No  . Drug use: No  . Sexual activity: Yes    Birth control/ protection: Post-menopausal    Tobacco Counseling Counseling given: Not Answered Patient states that she is a former smoker and quit smoking about 12 years ago.   Activities of Daily Living In your present state of health, do you have any difficulty performing the following activities: 11/22/2016  Hearing? N  Vision? N  Difficulty concentrating or making decisions? Y  Walking or climbing stairs? Y    Dressing or bathing? N  Doing errands, shopping? N  Some recent data might be hidden  Patient states that she occasionally has trouble remembering  She has trouble climbing stairs due to her right knee.   Immunizations and Health Maintenance Immunization History  Administered Date(s) Administered  . Influenza, High Dose Seasonal PF 07/11/2016  . Tdap 11/22/2016  . Zoster 09/19/2008   There are no preventive care reminders to display for this patient.  Patient Care Team: Terald Sleeper, PA-C as PCP - General ([place]) Rogene Houston, MD as Consulting Physician (Gastroenterology)  Indicate any recent Medical Services you may have received from other than [facility] providers in the past year (date may be approximate).     Assessment:   This is a routine wellness examination for Angela Parrish.   Hearing/Vision screen Angela Parrish does not have any concerns with her hearing or vision.   Dietary issues and exercise activities discussed:    Goals    . Exercise 3x per week (30 min per time)    . Have 3 meals a day      Depression Screen PHQ 2/9 Scores 11/22/2016 10/03/2016 07/11/2016 04/07/2016  PHQ - 2 Score 0 0 2 0  PHQ- 9 Score - - 4 -    Fall Risk Fall Risk  11/22/2016 10/03/2016 07/11/2016 04/07/2016  Falls in the past year? No No No No    Cognitive Function: MMSE - Mini Mental State Exam 11/22/2016  Orientation to time 5  Orientation to Place 5  Registration 3  Attention/ Calculation 5  Recall 3  Language- name 2 objects 2  Language- repeat 1  Language- follow 3 step command 3  Language- read & follow direction 1  Write a sentence 1  Copy design 1  Total score 30    Patient scored 30 out of 30 on MMSE.    Screening Tests Health Maintenance  Topic Date Due  . DEXA SCAN  07/20/2017 (Originally 04/28/2005)  . TETANUS/TDAP  07/20/2017 (Originally [birth year]/29/1959)  . PNA vac Low Risk Adult (1 of 2 - PCV13) 07/20/2017 (Originally 04/28/2005)  . INFLUENZA VACCINE  03/21/2017       Plan:  Patient to follow up with PCP. Tdap given today. Pneumonia vaccine to be given at next appointment. I am going to call and verify which pneumonia vaccine was given at [facility] in 06/2014.  During the course of the visit, Angela Parrish was educated and counseled about the following appropriate screening and preventive services:   Vaccines to include Pneumoccal, Influenza, Hepatitis B, Td, Zostavax, HCV  Electrocardiogram  Cardiovascular disease screening  Colorectal cancer screening  Bone density screening  Diabetes screening  Glaucoma screening  Mammography/PAP  Nutrition counseling  Smoking cessation counseling  Patient Instructions (the written plan) were given to the patient.    Gareth Morgan, LPN   0/11/7531    I have reviewed and agree with the above AWV documentation.   Terald Sleeper PA-C [facility] [address]  [place], [place] [address] [phone]

## 2016-11-22 NOTE — Patient Instructions (Addendum)
   Angela Parrish , Thank you for taking time to come for your Medicare Wellness Visit. I appreciate your ongoing commitment to your health goals. Please review the following plan we discussed and let me know if I can assist you in the future.   These are the goals we discussed: Goals    . Exercise 3x per week (30 min per time)    . Have 3 meals a day       This is a list of the screening recommended for you and due dates:  Health Maintenance  Topic Date Due  . DEXA scan (bone density measurement)  07/20/2017*  . Tetanus Vaccine  07/20/2017*  . Pneumonia vaccines (1 of 2 - PCV13) 07/20/2017*  . Flu Shot  03/21/2017  *Topic was postponed. The date shown is not the original due date.   Follow up with Glenard Haring at the end of May. Review Advance Directive forms given  It was great to see you today!

## 2016-12-19 DIAGNOSIS — H2513 Age-related nuclear cataract, bilateral: Secondary | ICD-10-CM | POA: Diagnosis not present

## 2016-12-19 DIAGNOSIS — H40033 Anatomical narrow angle, bilateral: Secondary | ICD-10-CM | POA: Diagnosis not present

## 2016-12-25 DIAGNOSIS — H26492 Other secondary cataract, left eye: Secondary | ICD-10-CM | POA: Diagnosis not present

## 2017-01-11 DIAGNOSIS — H578 Other specified disorders of eye and adnexa: Secondary | ICD-10-CM | POA: Diagnosis not present

## 2017-01-11 DIAGNOSIS — T1511XA Foreign body in conjunctival sac, right eye, initial encounter: Secondary | ICD-10-CM | POA: Diagnosis not present

## 2017-01-12 ENCOUNTER — Ambulatory Visit (INDEPENDENT_AMBULATORY_CARE_PROVIDER_SITE_OTHER): Payer: Medicare HMO | Admitting: Physician Assistant

## 2017-01-12 ENCOUNTER — Encounter: Payer: Self-pay | Admitting: Physician Assistant

## 2017-01-12 VITALS — BP 121/69 | HR 70 | Temp 97.4°F | Ht 62.0 in | Wt 147.8 lb

## 2017-01-12 DIAGNOSIS — L509 Urticaria, unspecified: Secondary | ICD-10-CM | POA: Insufficient documentation

## 2017-01-12 DIAGNOSIS — L570 Actinic keratosis: Secondary | ICD-10-CM | POA: Insufficient documentation

## 2017-01-12 DIAGNOSIS — R635 Abnormal weight gain: Secondary | ICD-10-CM | POA: Insufficient documentation

## 2017-01-12 MED ORDER — PREDNISONE 10 MG (48) PO TBPK: ORAL_TABLET | ORAL | 0 refills | Status: DC

## 2017-01-12 MED ORDER — RANITIDINE HCL 150 MG PO TABS: 150.0000 mg | ORAL_TABLET | Freq: Two times a day (BID) | ORAL | 3 refills | Status: DC

## 2017-01-12 MED ORDER — LORATADINE 10 MG PO TBDP: 10.0000 mg | ORAL_TABLET | Freq: Every day | ORAL | 3 refills | Status: DC

## 2017-01-12 MED ORDER — PHENTERMINE HCL 37.5 MG PO CAPS: 37.5000 mg | ORAL_CAPSULE | ORAL | 1 refills | Status: DC

## 2017-01-12 NOTE — Progress Notes (Signed)
BP 121/69   Pulse 70   Temp 97.4 F (36.3 C) (Oral)   Ht 5\' 2"  (1.575 m)   Wt 147 lb 12.8 oz (67 kg)   BMI 27.03 kg/m    Subjective:    Patient ID: Angela Parrish, female    DOB: 04/04/1941, 76 y.o.   MRN: [id number]  HPI: Angela Parrish is a 76 y.o. female presenting on 01/12/2017 for Follow-up (6 month ) and Rash  This patient comes in for A new onset of rash and itching. She states that she has not had any known exposure to anything new. She has not ingested anything new. The rash will come and go. It has severe itching at times. She had some on her face and did use a topical steroid and had some help. She also states that she has had trouble with losing weight. She would like to try a good diet and an appetite suppressant. In addition she has an abnormal lesion on her upper right back. It is scaling and peeling at times..   All medications are reviewed today. There are no reports of any problems with the medications. All of the medical conditions are reviewed and updated.  Lab work is reviewed and will be ordered as medically necessary. There are no new problems reported with today's visit.   Relevant past medical, surgical, family and social history reviewed and updated as indicated. Allergies and medications reviewed and updated.  Past Medical History:  Diagnosis Date  . Abnormal Pap smear    has had a colposcopy in the past  . Abnormal Pap smear of cervix 06/29/2011  . Breast cancer ([place]) 2000   lumpectomy LEFT  . COPD (chronic obstructive pulmonary disease) ([place])   . Hyperlipidemia     Past Surgical History:  Procedure Laterality Date  . BREAST LUMPECTOMY  2000   left breast  . CATARACT EXTRACTION, BILATERAL    . COLONOSCOPY N/A 09/16/2014   Procedure: COLONOSCOPY;  Surgeon: Rogene Houston, MD;  [facility];  Service: Endoscopy;  Laterality: N/A;  830 - moved to 9:15 - Ann to notify  . KNEE CARTILAGE SURGERY Right     Review of Systems  Constitutional:  Negative.  Negative for activity change, fatigue and fever.  HENT: Negative.   Eyes: Negative.   Respiratory: Negative.  Negative for cough.   Cardiovascular: Negative.  Negative for chest pain.  Gastrointestinal: Negative.  Negative for abdominal pain.  Endocrine: Negative.   Genitourinary: Negative.  Negative for dysuria.  Musculoskeletal: Negative.   Skin: Positive for color change and rash.  Neurological: Negative.     Allergies as of 01/12/2017   No Known Allergies     Medication List       Accurate as of 01/12/17  8:44 AM. Always use your most recent med list.          albuterol (2.5 MG/3ML) 0.083% nebulizer solution Commonly known as:  PROVENTIL Take 3 mLs (2.5 mg total) by nebulization every 6 (six) hours as needed for wheezing or shortness of breath.   albuterol 108 (90 Base) MCG/ACT inhaler Commonly known as:  PROVENTIL HFA;VENTOLIN HFA Inhale 2 puffs into the lungs every 6 (six) hours as needed for wheezing or shortness of breath. Prefers VENTOLIN   atorvastatin 10 MG tablet Commonly known as:  LIPITOR TAKE ONE (1) TABLET EACH DAY   budesonide-formoterol 160-4.5 MCG/ACT inhaler Commonly known as:  SYMBICORT Inhale 2 puffs into the lungs 2 (two) times  daily.   erythromycin ophthalmic ointment   Fish Oil 1200 MG Caps Take by mouth.   loratadine 10 MG dissolvable tablet Commonly known as:  CLARITIN REDITABS Take 1 tablet (10 mg total) by mouth daily.   OSTEO BI-FLEX REGULAR STRENGTH PO Take by mouth.   phentermine 37.5 MG capsule Take 1 capsule (37.5 mg total) by mouth every morning.   predniSONE 10 MG (48) Tbpk tablet Commonly known as:  STERAPRED UNI-PAK 48 TAB 12 day pack, take as directed   ranitidine 150 MG tablet Commonly known as:  ZANTAC Take 1 tablet (150 mg total) by mouth 2 (two) times daily.   tobramycin 0.3 % ophthalmic solution Commonly known as:  TOBREX   traZODone 50 MG tablet Commonly known as:  DESYREL Take 1-2 tablets  (50-100 mg total) by mouth at bedtime.   Turmeric 500 MG Tabs Take by mouth.   WOMENS MULTI VITAMIN & MINERAL PO Take by mouth.          Objective:    BP 121/69   Pulse 70   Temp 97.4 F (36.3 C) (Oral)   Ht 5\' 2"  (1.575 m)   Wt 147 lb 12.8 oz (67 kg)   BMI 27.03 kg/m   No Known Allergies  Physical Exam  Constitutional: She is oriented to person, place, and time. She appears well-developed and well-nourished.  HENT:  Head: Normocephalic and atraumatic.  Eyes: Conjunctivae and EOM are normal. Pupils are equal, round, and reactive to light.  Cardiovascular: Normal rate, regular rhythm, normal heart sounds and intact distal pulses.   Pulmonary/Chest: Effort normal and breath sounds normal.  Abdominal: Soft. Bowel sounds are normal.  Neurological: She is alert and oriented to person, place, and time. She has normal reflexes.  Skin: Skin is warm and dry. Lesion and rash noted. Rash is maculopapular and urticarial. There is erythema.     Psychiatric: She has a normal mood and affect. Her behavior is normal. Judgment and thought content normal.  Nursing note and vitals reviewed.       Assessment & Plan:   1. Urticaria - ranitidine (ZANTAC) 150 MG tablet; Take 1 tablet (150 mg total) by mouth 2 (two) times daily.  Dispense: 90 tablet; Refill: 3 - loratadine (CLARITIN REDITABS) 10 MG dissolvable tablet; Take 1 tablet (10 mg total) by mouth daily.  Dispense: 90 tablet; Refill: 3 - predniSONE (STERAPRED UNI-PAK 48 TAB) 10 MG (48) TBPK tablet; 12 day pack, take as directed  Dispense: 48 tablet; Refill: 0  2. Weight gain - phentermine 37.5 MG capsule; Take 1 capsule (37.5 mg total) by mouth every morning.  Dispense: 30 capsule; Refill: 1  3. Actinic keratosis - Ambulatory referral to Dermatology    Current Outpatient Prescriptions:  .  albuterol (PROVENTIL HFA;VENTOLIN HFA) 108 (90 Base) MCG/ACT inhaler, Inhale 2 puffs into the lungs every 6 (six) hours as needed for  wheezing or shortness of breath. Prefers VENTOLIN, Disp: 1 Inhaler, Rfl: 12 .  albuterol (PROVENTIL) (2.5 MG/3ML) 0.083% nebulizer solution, Take 3 mLs (2.5 mg total) by nebulization every 6 (six) hours as needed for wheezing or shortness of breath., Disp: 360 mL, Rfl: 12 .  atorvastatin (LIPITOR) 10 MG tablet, TAKE ONE (1) TABLET EACH DAY, Disp: 30 tablet, Rfl: 3 .  budesonide-formoterol (SYMBICORT) 160-4.5 MCG/ACT inhaler, Inhale 2 puffs into the lungs 2 (two) times daily., Disp: 1 Inhaler, Rfl: 12 .  erythromycin ophthalmic ointment, , Disp: , Rfl:  .  Glucosamine-Chondroitin (OSTEO BI-FLEX REGULAR STRENGTH PO),  Take by mouth., Disp: , Rfl:  .  Multiple Vitamins-Minerals (WOMENS MULTI VITAMIN & MINERAL PO), Take by mouth., Disp: , Rfl:  .  Omega-3 Fatty Acids (FISH OIL) 1200 MG CAPS, Take by mouth., Disp: , Rfl:  .  tobramycin (TOBREX) 0.3 % ophthalmic solution, , Disp: , Rfl:  .  traZODone (DESYREL) 50 MG tablet, Take 1-2 tablets (50-100 mg total) by mouth at bedtime., Disp: 60 tablet, Rfl: 11 .  Turmeric 500 MG TABS, Take by mouth., Disp: , Rfl:  .  loratadine (CLARITIN REDITABS) 10 MG dissolvable tablet, Take 1 tablet (10 mg total) by mouth daily., Disp: 90 tablet, Rfl: 3 .  phentermine 37.5 MG capsule, Take 1 capsule (37.5 mg total) by mouth every morning., Disp: 30 capsule, Rfl: 1 .  predniSONE (STERAPRED UNI-PAK 48 TAB) 10 MG (48) TBPK tablet, 12 day pack, take as directed, Disp: 48 tablet, Rfl: 0 .  ranitidine (ZANTAC) 150 MG tablet, Take 1 tablet (150 mg total) by mouth 2 (two) times daily., Disp: 90 tablet, Rfl: 3  Continue all other maintenance medications as listed above.  Follow up plan: Return in about 2 months (around 03/14/2017).  Educational handout given for diet base  Terald Sleeper PA-C [facility] [address]  [place], [place] [address] [phone]   01/12/2017, 8:44 AM

## 2017-01-12 NOTE — Patient Instructions (Addendum)
Breakfast: eggs 2-3 Or greek yogurt low fat DANNON 1 slice delightful Lynnae Sandhoff bread  Lunch: 2 slice Lynnae Sandhoff delightfully bread       Or Nature's Own Light 4 ounces chicken, [place], roast beef 1 slice Thin sliced cheese Sargento Mustard ok 1 piece of fruit  Supper: 6 ounces lean meat 2 cups raw/cooked veg or 1 cup pintos, corn lima  3 snacks 100 calories or less   Premier Protein low carb 160 calories  30g protein

## 2017-01-24 ENCOUNTER — Other Ambulatory Visit: Payer: Self-pay | Admitting: Physician Assistant

## 2017-01-24 DIAGNOSIS — L820 Inflamed seborrheic keratosis: Secondary | ICD-10-CM | POA: Diagnosis not present

## 2017-01-24 DIAGNOSIS — D229 Melanocytic nevi, unspecified: Secondary | ICD-10-CM | POA: Diagnosis not present

## 2017-01-24 DIAGNOSIS — D492 Neoplasm of unspecified behavior of bone, soft tissue, and skin: Secondary | ICD-10-CM | POA: Diagnosis not present

## 2017-01-24 DIAGNOSIS — L821 Other seborrheic keratosis: Secondary | ICD-10-CM | POA: Diagnosis not present

## 2017-01-24 DIAGNOSIS — Z85828 Personal history of other malignant neoplasm of skin: Secondary | ICD-10-CM | POA: Diagnosis not present

## 2017-03-01 ENCOUNTER — Other Ambulatory Visit: Payer: Self-pay | Admitting: Physician Assistant

## 2017-03-01 DIAGNOSIS — E785 Hyperlipidemia, unspecified: Secondary | ICD-10-CM

## 2017-03-02 NOTE — Telephone Encounter (Signed)
Last seen 01/12/17 Angela Parrish  Last lipid 07/11/16   If approved route to nurse to call into [facility]

## 2017-03-13 ENCOUNTER — Encounter: Payer: Self-pay | Admitting: Physician Assistant

## 2017-03-13 ENCOUNTER — Ambulatory Visit (INDEPENDENT_AMBULATORY_CARE_PROVIDER_SITE_OTHER): Payer: Medicare HMO

## 2017-03-13 ENCOUNTER — Ambulatory Visit (INDEPENDENT_AMBULATORY_CARE_PROVIDER_SITE_OTHER): Payer: Medicare HMO | Admitting: Physician Assistant

## 2017-03-13 VITALS — BP 135/77 | HR 80 | Temp 97.7°F | Ht 62.0 in | Wt 141.4 lb

## 2017-03-13 DIAGNOSIS — S99921A Unspecified injury of right foot, initial encounter: Secondary | ICD-10-CM | POA: Diagnosis not present

## 2017-03-13 DIAGNOSIS — M79671 Pain in right foot: Secondary | ICD-10-CM

## 2017-03-13 DIAGNOSIS — S92501A Displaced unspecified fracture of right lesser toe(s), initial encounter for closed fracture: Secondary | ICD-10-CM | POA: Insufficient documentation

## 2017-03-13 HISTORY — DX: Displaced unspecified fracture of right lesser toe(s), initial encounter for closed fracture: S92.501A

## 2017-03-13 NOTE — Progress Notes (Signed)
BP 135/77   Pulse 80   Temp 97.7 F (36.5 C) (Oral)   Ht 5\' 2"  (1.575 m)   Wt 141 lb 6.4 oz (64.1 kg)   BMI 25.86 kg/m    Subjective:    Patient ID: Angela Parrish, female    DOB: [birth year], 76 y.o.   MRN: [id number]  HPI: Angela Parrish is a 76 y.o. female presenting on 03/13/2017 for ? broke toe (Hit on wooden chest)  On 03/11/2017 the patient was walking through her house and hit her toe on a dresser. She had a sharp pain in the third toe with lateral rotation and deformity. This was never there before. The third toe is rubbing on the fourth toe. She has tried to buddy tape the third toe to the second toe but there is not been much improvement in the alignment. She has had pain with standing on it and propelling off of the forefoot. When she is sitting down there is no pain.  Relevant past medical, surgical, family and social history reviewed and updated as indicated. Allergies and medications reviewed and updated.  Past Medical History:  Diagnosis Date  . Abnormal Pap smear    has had a colposcopy in the past  . Abnormal Pap smear of cervix 06/29/2011  . Breast cancer ([place]) 2000   lumpectomy LEFT  . COPD (chronic obstructive pulmonary disease) ([place])   . Hyperlipidemia     Past Surgical History:  Procedure Laterality Date  . BREAST LUMPECTOMY  2000   left breast  . CATARACT EXTRACTION, BILATERAL    . COLONOSCOPY N/A 09/16/2014   Procedure: COLONOSCOPY;  Surgeon: Rogene Houston, MD;  [facility];  Service: Endoscopy;  Laterality: N/A;  830 - moved to 9:15 - Ann to notify  . KNEE CARTILAGE SURGERY Right     Review of Systems  Constitutional: Negative.   HENT: Negative.   Eyes: Negative.   Respiratory: Negative.   Gastrointestinal: Negative.   Genitourinary: Negative.     Allergies as of 03/13/2017   No Known Allergies     Medication List       Accurate as of 03/13/17  3:29 PM. Always use your most recent med list.          albuterol (2.5  MG/3ML) 0.083% nebulizer solution Commonly known as:  PROVENTIL Take 3 mLs (2.5 mg total) by nebulization every 6 (six) hours as needed for wheezing or shortness of breath.   albuterol 108 (90 Base) MCG/ACT inhaler Commonly known as:  PROVENTIL HFA;VENTOLIN HFA Inhale 2 puffs into the lungs every 6 (six) hours as needed for wheezing or shortness of breath. Prefers VENTOLIN   atorvastatin 10 MG tablet Commonly known as:  LIPITOR TAKE ONE (1) TABLET EACH DAY   budesonide-formoterol 160-4.5 MCG/ACT inhaler Commonly known as:  SYMBICORT Inhale 2 puffs into the lungs 2 (two) times daily.   Fish Oil 1200 MG Caps Take by mouth.   loratadine 10 MG dissolvable tablet Commonly known as:  CLARITIN REDITABS Take 1 tablet (10 mg total) by mouth daily.   OSTEO BI-FLEX REGULAR STRENGTH PO Take by mouth.   phentermine 37.5 MG capsule Take 1 capsule (37.5 mg total) by mouth every morning.   ranitidine 150 MG tablet Commonly known as:  ZANTAC Take 1 tablet (150 mg total) by mouth 2 (two) times daily.   traZODone 50 MG tablet Commonly known as:  DESYREL Take 1-2 tablets (50-100 mg total) by mouth at bedtime.  Turmeric 500 MG Tabs Take by mouth.   WOMENS MULTI VITAMIN & MINERAL PO Take by mouth.            Durable Medical Equipment        Start     Ordered   03/13/17 0000  DME Other see comment    Comments:  Post Op Shoe  DX: [id number]   03/13/17 1442         Objective:    BP 135/77   Pulse 80   Temp 97.7 F (36.5 C) (Oral)   Ht 5\' 2"  (1.575 m)   Wt 141 lb 6.4 oz (64.1 kg)   BMI 25.86 kg/m   No Known Allergies  Physical Exam  Constitutional: She is oriented to person, place, and time. She appears well-developed and well-nourished.  HENT:  Head: Normocephalic and atraumatic.  Eyes: Pupils are equal, round, and reactive to light. Conjunctivae and EOM are normal.  Cardiovascular: Normal rate, regular rhythm, normal heart sounds and intact distal pulses.     Pulmonary/Chest: Effort normal and breath sounds normal.  Abdominal: Soft. Bowel sounds are normal.  Musculoskeletal:       Right foot: There is decreased range of motion, tenderness, swelling and deformity.       Feet:  Third toe on right foot with lateral rotation and flexion. There is swelling present and bruising.  Neurological: She is alert and oriented to person, place, and time. She has normal reflexes.  Skin: Skin is warm and dry. No rash noted.  Psychiatric: She has a normal mood and affect. Her behavior is normal. Judgment and thought content normal.        Assessment & Plan:   1. Injury of toe on right foot, initial encounter - DG Foot Complete Right; Future - Ambulatory referral to Orthopedic Surgery  2. Closed fracture of phalanx of right third toe, initial encounter Mark a deformity of lateral movement third toe, new finding Final report radiology was no acute fracture Still consider Ortho [place] referral due to markedly deformity from normal - Ambulatory referral to Orthopedic Surgery - DME Other see comment   Continue all other maintenance medications as listed above.  Follow up plan: Return in about 2 weeks (around 03/27/2017) for recheck.  Educational handout given for toe fracture  Terald Sleeper PA-C [facility] [address]  [place], [place] [address] [phone]   03/13/2017, 3:29 PM

## 2017-03-13 NOTE — Patient Instructions (Signed)
Toe Fracture A toe fracture is a break in one of the toe bones (phalanges). What are the causes? This condition may be caused by:  Dropping a heavy object on your toe.  Stubbing your toe.  Overusing your toe or doing repetitive exercise.  Twisting or stretching your toe out of place.  What increases the risk? This condition is more likely to develop in people who:  Play contact sports.  Have a bone disease.  Have a low calcium level.  What are the signs or symptoms? The main symptoms of this condition are swelling and pain in the toe. The pain may get worse with standing or walking. Other symptoms include:  Bruising.  Stiffness.  Numbness.  A change in the way the toe looks.  Broken bones that poke through the skin.  Blood beneath the toenail.  How is this diagnosed? This condition is diagnosed with a physical exam. You may also have X-rays. How is this treated? Treatment for this condition depends on the type of fracture and its severity. Treatment may involve:  Taping the broken toe to a toe that is next to it (buddy taping). This is the most common treatment for fractures in which the bone has not moved out of place (nondisplaced fracture).  Wearing a shoe that has a wide, rigid sole to protect the toe and to limit its movement.  Wearing a walking cast.  Having a procedure to move the toe back into place.  Surgery. This may be needed: ? If there are many pieces of broken bone that are out of place (displaced). ? If the toe joint breaks. ? If the bone breaks through the skin.  Physical therapy. This is done to help regain movement and strength in the toe.  You may need follow-up X-rays to make sure that the bone is healing well and staying in position. Follow these instructions at home: If you have a cast:  Do not stick anything inside the cast to scratch your skin. Doing that increases your risk of infection.  Check the skin around the cast every day.  Report any concerns to your health care provider. You may put lotion on dry skin around the edges of the cast. Do not apply lotion to the skin underneath the cast.  Do not put pressure on any part of the cast until it is fully hardened. This may take several hours.  Keep the cast clean and dry. Bathing  Do not take baths, swim, or use a hot tub until your health care provider approves. Ask your health care provider if you can take showers. You may only be allowed to take sponge baths for bathing.  If your health care provider approves bathing and showering, cover the cast or bandage (dressing) with a watertight plastic bag to protect it from water. Do not let the cast or dressing get wet. Managing pain, stiffness, and swelling  If you do not have a cast, apply ice to the injured area, if directed. ? Put ice in a plastic bag. ? Place a towel between your skin and the bag. ? Leave the ice on for 20 minutes, 2-3 times per day.  Move your toes often to avoid stiffness and to lessen swelling.  Raise (elevate) the injured area above the level of your heart while you are sitting or lying down. Driving  Do not drive or operate heavy machinery while taking pain medicine.  Do not drive while wearing a cast on a foot that you  use for driving. Activity  Return to your normal activities as directed by your health care provider. Ask your health care provider what activities are safe for you.  Perform exercises daily as directed by your health care provider or physical therapist. Safety  Do not use the injured limb to support your body weight until your health care provider says that you can. Use crutches or other assistive devices as directed by your health care provider. General instructions  If your toe was treated with buddy taping, follow your health care provider's instructions for changing the gauze and tape. Change it more often: ? The gauze and tape get wet. If this happens, dry the  space between the toes. ? The gauze and tape are too tight and cause your toe to become pale or numb.  Wear a protective shoe as directed by your health care provider. If you were not given a protective shoe, wear sturdy, supportive shoes. Your shoes should not pinch your toes and should not fit tightly against your toes.  Do not use any tobacco products, including cigarettes, chewing tobacco, or e-cigarettes. Tobacco can delay bone healing. If you need help quitting, ask your health care provider.  Take medicines only as directed by your health care provider.  Keep all follow-up visits as directed by your health care provider. This is important. Contact a health care provider if:  You have a fever.  Your pain medicine is not helping.  Your toe is cold.  Your toe is numb.  You still have pain after one week of rest and treatment.  You still have pain after your health care provider has said that you can start walking again.  You have pain, tingling, or numbness in your foot that is not going away. Get help right away if:  You have severe pain.  You have redness or inflammation in your toe that is getting worse.  You have pain or numbness in your toe that is getting worse.  Your toe turns blue. This information is not intended to replace advice given to you by your health care provider. Make sure you discuss any questions you have with your health care provider. Document Released: 08/04/2000 Document Revised: 04/10/2016 Document Reviewed: 06/03/2014 Elsevier Interactive Patient Education  [employer].

## 2017-03-19 ENCOUNTER — Other Ambulatory Visit: Payer: Self-pay | Admitting: Physician Assistant

## 2017-03-19 DIAGNOSIS — Z1231 Encounter for screening mammogram for malignant neoplasm of breast: Secondary | ICD-10-CM

## 2017-03-30 ENCOUNTER — Other Ambulatory Visit: Payer: Self-pay | Admitting: Physician Assistant

## 2017-04-02 ENCOUNTER — Encounter: Payer: Self-pay | Admitting: Physician Assistant

## 2017-04-02 ENCOUNTER — Ambulatory Visit (INDEPENDENT_AMBULATORY_CARE_PROVIDER_SITE_OTHER): Payer: Medicare HMO | Admitting: Physician Assistant

## 2017-04-02 VITALS — BP 138/78 | HR 78 | Temp 98.0°F | Ht 62.0 in | Wt 141.8 lb

## 2017-04-02 DIAGNOSIS — R635 Abnormal weight gain: Secondary | ICD-10-CM | POA: Diagnosis not present

## 2017-04-02 DIAGNOSIS — S99921D Unspecified injury of right foot, subsequent encounter: Secondary | ICD-10-CM | POA: Diagnosis not present

## 2017-04-02 MED ORDER — PHENTERMINE HCL 37.5 MG PO CAPS
37.5000 mg | ORAL_CAPSULE | ORAL | 1 refills | Status: DC
Start: 2017-04-02 — End: 2017-06-13

## 2017-04-02 NOTE — Patient Instructions (Signed)
In a few days you may receive a survey in the mail or online from [employer] regarding your visit with us today. Please take a moment to fill this out. Your feedback is very important to our whole office. It can help us better understand your needs as well as improve your experience and satisfaction. Thank you for taking your time to complete it. We care about you.  Shaneal Barasch, PA-C  

## 2017-04-03 NOTE — Progress Notes (Signed)
BP 138/78   Pulse 78   Temp 98 F (36.7 C) (Oral)   Ht 5\' 2"  (1.575 m)   Wt 141 lb 12.8 oz (64.3 kg)   BMI 25.94 kg/m    Subjective:    Patient ID: Angela Parrish, female    DOB: 08/01/1941, 76 y.o.   MRN: [id number]  HPI: Angela Parrish is a 76 y.o. female presenting on 04/02/2017 for Follow-up (Broke toe )  Patient returns for an injury of her right third toe. She has had it buddy taped to the second toe. It seems to pull a lot on it. Therefore she has been wrapping the first second and third toes. This seems to keep it straighter. X-ray did not show any fractures. She states she has had increasing ability to move the foot and take pressure on those toes.  She reports that she's also had some weight gain and is starting a new diet soon and would like to have phentermine back.  Relevant past medical, surgical, family and social history reviewed and updated as indicated. Allergies and medications reviewed and updated.  Past Medical History:  Diagnosis Date  . Abnormal Pap smear    has had a colposcopy in the past  . Abnormal Pap smear of cervix 06/29/2011  . Breast cancer ([place]) 2000   lumpectomy LEFT  . COPD (chronic obstructive pulmonary disease) ([place])   . Hyperlipidemia     Past Surgical History:  Procedure Laterality Date  . BREAST LUMPECTOMY  2000   left breast  . CATARACT EXTRACTION, BILATERAL    . COLONOSCOPY N/A 09/16/2014   Procedure: COLONOSCOPY;  Surgeon: Rogene Houston, MD;  [facility];  Service: Endoscopy;  Laterality: N/A;  830 - moved to 9:15 - Ann to notify  . KNEE CARTILAGE SURGERY Right     Review of Systems  Constitutional: Negative.  Negative for activity change, fatigue and fever.  HENT: Negative.   Eyes: Negative.   Respiratory: Negative.  Negative for cough.   Cardiovascular: Negative.  Negative for chest pain.  Gastrointestinal: Negative.  Negative for abdominal pain.  Endocrine: Negative.   Genitourinary: Negative.  Negative for  dysuria.  Musculoskeletal: Positive for joint swelling.  Skin: Negative.   Neurological: Negative.     Allergies as of 04/02/2017   No Known Allergies     Medication List       Accurate as of 04/02/17 11:59 PM. Always use your most recent med list.          albuterol (2.5 MG/3ML) 0.083% nebulizer solution Commonly known as:  PROVENTIL Take 3 mLs (2.5 mg total) by nebulization every 6 (six) hours as needed for wheezing or shortness of breath.   albuterol 108 (90 Base) MCG/ACT inhaler Commonly known as:  PROVENTIL HFA;VENTOLIN HFA Inhale 2 puffs into the lungs every 6 (six) hours as needed for wheezing or shortness of breath. Prefers VENTOLIN   atorvastatin 10 MG tablet Commonly known as:  LIPITOR TAKE ONE (1) TABLET EACH DAY   budesonide-formoterol 160-4.5 MCG/ACT inhaler Commonly known as:  SYMBICORT Inhale 2 puffs into the lungs 2 (two) times daily.   Fish Oil 1200 MG Caps Take by mouth.   loratadine 10 MG dissolvable tablet Commonly known as:  CLARITIN REDITABS Take 1 tablet (10 mg total) by mouth daily.   OSTEO BI-FLEX REGULAR STRENGTH PO Take by mouth.   phentermine 37.5 MG capsule Take 1 capsule (37.5 mg total) by mouth every morning.   ranitidine  150 MG tablet Commonly known as:  ZANTAC Take 1 tablet (150 mg total) by mouth 2 (two) times daily.   traZODone 50 MG tablet Commonly known as:  DESYREL TAKE 1 TO 2 TABLETS AT BEDTIME   Turmeric 500 MG Tabs Take by mouth.   WOMENS MULTI VITAMIN & MINERAL PO Take by mouth.          Objective:    BP 138/78   Pulse 78   Temp 98 F (36.7 C) (Oral)   Ht 5\' 2"  (1.575 m)   Wt 141 lb 12.8 oz (64.3 kg)   BMI 25.94 kg/m   No Known Allergies  Physical Exam  Constitutional: She is oriented to person, place, and time. She appears well-developed and well-nourished.  HENT:  Head: Normocephalic and atraumatic.  Eyes: Pupils are equal, round, and reactive to light. Conjunctivae and EOM are normal.    Cardiovascular: Normal rate, regular rhythm, normal heart sounds and intact distal pulses.   Pulmonary/Chest: Effort normal and breath sounds normal.  Abdominal: Soft. Bowel sounds are normal.  Musculoskeletal:       Right foot: There is swelling and deformity. There is no crepitus.       Feet:  Neurological: She is alert and oriented to person, place, and time. She has normal reflexes.  Skin: Skin is warm and dry. No rash noted.  Psychiatric: She has a normal mood and affect. Her behavior is normal. Judgment and thought content normal.        Assessment & Plan:   1. Toe injury, right, subsequent encounter  2. Weight gain - phentermine 37.5 MG capsule; Take 1 capsule (37.5 mg total) by mouth every morning.  Dispense: 30 capsule; Refill: 1    Current Outpatient Prescriptions:  .  albuterol (PROVENTIL HFA;VENTOLIN HFA) 108 (90 Base) MCG/ACT inhaler, Inhale 2 puffs into the lungs every 6 (six) hours as needed for wheezing or shortness of breath. Prefers VENTOLIN, Disp: 1 Inhaler, Rfl: 12 .  albuterol (PROVENTIL) (2.5 MG/3ML) 0.083% nebulizer solution, Take 3 mLs (2.5 mg total) by nebulization every 6 (six) hours as needed for wheezing or shortness of breath., Disp: 360 mL, Rfl: 12 .  atorvastatin (LIPITOR) 10 MG tablet, TAKE ONE (1) TABLET EACH DAY, Disp: 90 tablet, Rfl: 1 .  budesonide-formoterol (SYMBICORT) 160-4.5 MCG/ACT inhaler, Inhale 2 puffs into the lungs 2 (two) times daily., Disp: 1 Inhaler, Rfl: 12 .  Glucosamine-Chondroitin (OSTEO BI-FLEX REGULAR STRENGTH PO), Take by mouth., Disp: , Rfl:  .  loratadine (CLARITIN REDITABS) 10 MG dissolvable tablet, Take 1 tablet (10 mg total) by mouth daily., Disp: 90 tablet, Rfl: 3 .  Multiple Vitamins-Minerals (WOMENS MULTI VITAMIN & MINERAL PO), Take by mouth., Disp: , Rfl:  .  Omega-3 Fatty Acids (FISH OIL) 1200 MG CAPS, Take by mouth., Disp: , Rfl:  .  phentermine 37.5 MG capsule, Take 1 capsule (37.5 mg total) by mouth every morning.,  Disp: 30 capsule, Rfl: 1 .  ranitidine (ZANTAC) 150 MG tablet, Take 1 tablet (150 mg total) by mouth 2 (two) times daily., Disp: 90 tablet, Rfl: 3 .  traZODone (DESYREL) 50 MG tablet, TAKE 1 TO 2 TABLETS AT BEDTIME, Disp: 180 tablet, Rfl: 0 .  Turmeric 500 MG TABS, Take by mouth., Disp: , Rfl:  Continue all other maintenance medications as listed above.  Follow up plan: Return if symptoms worsen or fail to improve, for [facility] [place] for VACCINE record.  Educational handout given for [place]  Terald Sleeper  PA-C [facility] [address]  [place], [place] [address] [phone]   04/03/2017, 1:48 PM

## 2017-04-30 ENCOUNTER — Ambulatory Visit
Admission: RE | Admit: 2017-04-30 | Discharge: 2017-04-30 | Disposition: A | Discharge status: Home / Self Care | Payer: Medicare HMO | Source: Ambulatory Visit | Attending: Physician Assistant | Admitting: Physician Assistant

## 2017-04-30 DIAGNOSIS — Z1231 Encounter for screening mammogram for malignant neoplasm of breast: Secondary | ICD-10-CM | POA: Diagnosis not present

## 2017-06-13 ENCOUNTER — Other Ambulatory Visit: Payer: Self-pay | Admitting: Physician Assistant

## 2017-06-13 ENCOUNTER — Ambulatory Visit (INDEPENDENT_AMBULATORY_CARE_PROVIDER_SITE_OTHER): Payer: Medicare HMO

## 2017-06-13 DIAGNOSIS — Z23 Encounter for immunization: Secondary | ICD-10-CM

## 2017-06-13 DIAGNOSIS — R635 Abnormal weight gain: Secondary | ICD-10-CM

## 2017-06-15 NOTE — Telephone Encounter (Signed)
Rx phoned in.   

## 2017-06-20 ENCOUNTER — Encounter: Payer: Self-pay | Admitting: Physician Assistant

## 2017-06-20 ENCOUNTER — Encounter (INDEPENDENT_AMBULATORY_CARE_PROVIDER_SITE_OTHER): Payer: Self-pay

## 2017-06-20 ENCOUNTER — Ambulatory Visit (INDEPENDENT_AMBULATORY_CARE_PROVIDER_SITE_OTHER): Payer: Medicare HMO | Admitting: Physician Assistant

## 2017-06-20 VITALS — BP 132/65 | HR 75 | Temp 98.3°F | Ht 62.0 in | Wt 140.2 lb

## 2017-06-20 DIAGNOSIS — Z23 Encounter for immunization: Secondary | ICD-10-CM

## 2017-06-20 DIAGNOSIS — I10 Essential (primary) hypertension: Secondary | ICD-10-CM | POA: Diagnosis not present

## 2017-06-20 DIAGNOSIS — J410 Simple chronic bronchitis: Secondary | ICD-10-CM | POA: Diagnosis not present

## 2017-06-20 MED ORDER — ALBUTEROL SULFATE (2.5 MG/3ML) 0.083% IN NEBU: 2.5000 mg | INHALATION_SOLUTION | Freq: Four times a day (QID) | RESPIRATORY_TRACT | 12 refills | Status: DC | PRN

## 2017-06-20 NOTE — Patient Instructions (Signed)
In a few days you may receive a survey in the mail or online from [employer] regarding your visit with us today. Please take a moment to fill this out. Your feedback is very important to our whole office. It can help us better understand your needs as well as improve your experience and satisfaction. Thank you for taking your time to complete it. We care about you.  Marlean Mortell, PA-C  

## 2017-06-20 NOTE — Progress Notes (Signed)
BP 132/65   Pulse 75   Temp 98.3 F (36.8 C) (Oral)   Ht 5\' 2"  (1.575 m)   Wt 140 lb 3.2 oz (63.6 kg)   BMI 25.64 kg/m    Subjective:    Patient ID: Angela Parrish, female    DOB: 1940-09-10, 76 y.o.   MRN: [id number]  HPI: Angela Parrish is a 76 y.o. female presenting on 06/20/2017 for Follow-up and Hypertension  Patient comes in for follow-up on her COPD and hypertension.  She is due a pneumonia vaccine.  This will be updated today.  She is doing very well overall.  Blood pressure readings have been very good.  And her breathing has been quite good lately.  No other complaints today.  Relevant past medical, surgical, family and social history reviewed and updated as indicated. Allergies and medications reviewed and updated.  Past Medical History:  Diagnosis Date  . Abnormal Pap smear    has had a colposcopy in the past  . Abnormal Pap smear of cervix 06/29/2011  . Breast cancer ([place]) 2000   lumpectomy LEFT  . COPD (chronic obstructive pulmonary disease) ([place])   . Hyperlipidemia     Past Surgical History:  Procedure Laterality Date  . BREAST LUMPECTOMY  2000   left breast  . CATARACT EXTRACTION, BILATERAL    . COLONOSCOPY N/A 09/16/2014   Procedure: COLONOSCOPY;  Surgeon: Rogene Houston, MD;  [facility];  Service: Endoscopy;  Laterality: N/A;  830 - moved to 9:15 - Ann to notify  . KNEE CARTILAGE SURGERY Right     Review of Systems  Constitutional: Negative.  Negative for activity change, fatigue and fever.  HENT: Negative.   Eyes: Negative.   Respiratory: Negative.  Negative for cough.   Cardiovascular: Negative.  Negative for chest pain.  Gastrointestinal: Negative.  Negative for abdominal pain.  Endocrine: Negative.   Genitourinary: Negative.  Negative for dysuria.  Musculoskeletal: Negative.   Skin: Negative.   Neurological: Negative.     Allergies as of 06/20/2017   No Known Allergies     Medication List       Accurate as of 06/20/17   1:29 PM. Always use your most recent med list.          albuterol 108 (90 Base) MCG/ACT inhaler Commonly known as:  PROVENTIL HFA;VENTOLIN HFA Inhale 2 puffs into the lungs every 6 (six) hours as needed for wheezing or shortness of breath. Prefers VENTOLIN   albuterol (2.5 MG/3ML) 0.083% nebulizer solution Commonly known as:  PROVENTIL Take 3 mLs (2.5 mg total) by nebulization every 6 (six) hours as needed for wheezing or shortness of breath.   atorvastatin 10 MG tablet Commonly known as:  LIPITOR TAKE ONE (1) TABLET EACH DAY   budesonide-formoterol 160-4.5 MCG/ACT inhaler Commonly known as:  SYMBICORT Inhale 2 puffs into the lungs 2 (two) times daily.   Fish Oil 1200 MG Caps Take by mouth.   loratadine 10 MG dissolvable tablet Commonly known as:  CLARITIN REDITABS Take 1 tablet (10 mg total) by mouth daily.   OSTEO BI-FLEX REGULAR STRENGTH PO Take by mouth.   phentermine 37.5 MG capsule TAKE ONE TABLET EVERY MORNING   ranitidine 150 MG tablet Commonly known as:  ZANTAC Take 1 tablet (150 mg total) by mouth 2 (two) times daily.   traZODone 50 MG tablet Commonly known as:  DESYREL TAKE 1 TO 2 TABLETS AT BEDTIME   Turmeric 500 MG Tabs Take by mouth.  WOMENS MULTI VITAMIN & MINERAL PO Take by mouth.          Objective:    BP 132/65   Pulse 75   Temp 98.3 F (36.8 C) (Oral)   Ht 5\' 2"  (1.575 m)   Wt 140 lb 3.2 oz (63.6 kg)   BMI 25.64 kg/m   No Known Allergies  Physical Exam  Constitutional: She is oriented to person, place, and time. She appears well-developed and well-nourished.  HENT:  Head: Normocephalic and atraumatic.  Eyes: Pupils are equal, round, and reactive to light. Conjunctivae and EOM are normal.  Cardiovascular: Normal rate, regular rhythm, normal heart sounds and intact distal pulses.   Pulmonary/Chest: Effort normal and breath sounds normal.  Abdominal: Soft. Bowel sounds are normal.  Neurological: She is alert and oriented to  person, place, and time. She has normal reflexes.  Skin: Skin is warm and dry. No rash noted.  Psychiatric: She has a normal mood and affect. Her behavior is normal. Judgment and thought content normal.  Nursing note and vitals reviewed.       Assessment & Plan:   1. Simple chronic bronchitis (HCC) - albuterol (PROVENTIL) (2.5 MG/3ML) 0.083% nebulizer solution; Take 3 mLs (2.5 mg total) by nebulization every 6 (six) hours as needed for wheezing or shortness of breath.  Dispense: 360 mL; Refill: 12  2. Essential hypertension    Current Outpatient Prescriptions:  .  albuterol (PROVENTIL HFA;VENTOLIN HFA) 108 (90 Base) MCG/ACT inhaler, Inhale 2 puffs into the lungs every 6 (six) hours as needed for wheezing or shortness of breath. Prefers VENTOLIN, Disp: 1 Inhaler, Rfl: 12 .  albuterol (PROVENTIL) (2.5 MG/3ML) 0.083% nebulizer solution, Take 3 mLs (2.5 mg total) by nebulization every 6 (six) hours as needed for wheezing or shortness of breath., Disp: 360 mL, Rfl: 12 .  atorvastatin (LIPITOR) 10 MG tablet, TAKE ONE (1) TABLET EACH DAY, Disp: 90 tablet, Rfl: 1 .  budesonide-formoterol (SYMBICORT) 160-4.5 MCG/ACT inhaler, Inhale 2 puffs into the lungs 2 (two) times daily., Disp: 1 Inhaler, Rfl: 12 .  Glucosamine-Chondroitin (OSTEO BI-FLEX REGULAR STRENGTH PO), Take by mouth., Disp: , Rfl:  .  loratadine (CLARITIN REDITABS) 10 MG dissolvable tablet, Take 1 tablet (10 mg total) by mouth daily., Disp: 90 tablet, Rfl: 3 .  Multiple Vitamins-Minerals (WOMENS MULTI VITAMIN & MINERAL PO), Take by mouth., Disp: , Rfl:  .  Omega-3 Fatty Acids (FISH OIL) 1200 MG CAPS, Take by mouth., Disp: , Rfl:  .  phentermine 37.5 MG capsule, TAKE ONE TABLET EVERY MORNING, Disp: 30 capsule, Rfl: 1 .  ranitidine (ZANTAC) 150 MG tablet, Take 1 tablet (150 mg total) by mouth 2 (two) times daily., Disp: 90 tablet, Rfl: 3 .  traZODone (DESYREL) 50 MG tablet, TAKE 1 TO 2 TABLETS AT BEDTIME, Disp: 180 tablet, Rfl: 3 .   Turmeric 500 MG TABS, Take by mouth., Disp: , Rfl:  Continue all other maintenance medications as listed above.  Follow up plan: Return in about 6 months (around 12/18/2017) for recheck.  Educational handout given for [place] PA-C [facility] [address]  [place],  [address] [phone]   06/20/2017, 1:29 PM

## 2017-10-05 ENCOUNTER — Ambulatory Visit: Payer: Medicare HMO | Admitting: Physician Assistant

## 2017-10-08 ENCOUNTER — Encounter: Payer: Self-pay | Admitting: Physician Assistant

## 2017-10-08 ENCOUNTER — Ambulatory Visit (INDEPENDENT_AMBULATORY_CARE_PROVIDER_SITE_OTHER): Payer: Medicare HMO | Admitting: Physician Assistant

## 2017-10-08 VITALS — BP 135/76 | HR 81 | Temp 97.8°F | Ht 62.0 in | Wt 140.6 lb

## 2017-10-08 DIAGNOSIS — M25551 Pain in right hip: Secondary | ICD-10-CM | POA: Insufficient documentation

## 2017-10-08 DIAGNOSIS — G8929 Other chronic pain: Secondary | ICD-10-CM

## 2017-10-08 DIAGNOSIS — J410 Simple chronic bronchitis: Secondary | ICD-10-CM

## 2017-10-08 DIAGNOSIS — R519 Headache, unspecified: Secondary | ICD-10-CM | POA: Insufficient documentation

## 2017-10-08 DIAGNOSIS — M25561 Pain in right knee: Secondary | ICD-10-CM | POA: Diagnosis not present

## 2017-10-08 DIAGNOSIS — R0789 Other chest pain: Secondary | ICD-10-CM

## 2017-10-08 DIAGNOSIS — R030 Elevated blood-pressure reading, without diagnosis of hypertension: Secondary | ICD-10-CM | POA: Diagnosis not present

## 2017-10-08 DIAGNOSIS — R51 Headache: Secondary | ICD-10-CM

## 2017-10-08 HISTORY — DX: Headache, unspecified: R51.9

## 2017-10-08 MED ORDER — TRAZODONE HCL 50 MG PO TABS: ORAL_TABLET | ORAL | 3 refills | Status: DC

## 2017-10-08 MED ORDER — ALBUTEROL SULFATE HFA 108 (90 BASE) MCG/ACT IN AERS: 2.0000 | INHALATION_SPRAY | Freq: Four times a day (QID) | RESPIRATORY_TRACT | 12 refills | Status: DC | PRN

## 2017-10-08 MED ORDER — HYDROCHLOROTHIAZIDE 25 MG PO TABS: 25.0000 mg | ORAL_TABLET | Freq: Every day | ORAL | 3 refills | Status: DC

## 2017-10-08 MED ORDER — BUDESONIDE-FORMOTEROL FUMARATE 160-4.5 MCG/ACT IN AERO: 2.0000 | INHALATION_SPRAY | Freq: Two times a day (BID) | RESPIRATORY_TRACT | 12 refills | Status: DC

## 2017-10-08 NOTE — Patient Instructions (Signed)
In a few days you may receive a survey in the mail or online from [employer] regarding your visit with us today. Please take a moment to fill this out. Your feedback is very important to our whole office. It can help us better understand your needs as well as improve your experience and satisfaction. Thank you for taking your time to complete it. We care about you.  Alyus Mofield, PA-C  

## 2017-10-08 NOTE — Progress Notes (Signed)
BP 135/76   Pulse 81   Temp 97.8 F (36.6 C) (Oral)   Ht 5\' 2"  (1.575 m)   Wt 140 lb 9.6 oz (63.8 kg)   BMI 25.72 kg/m    Subjective:    Patient ID: Angela Parrish, female    DOB: March 18, 1941, 77 y.o.   MRN: [id number]  HPI: Angela Parrish is a 77 y.o. female presenting on 10/08/2017 for Headache and Hypertension (Discuss Bp )  Patient comes in for atypical chest pain.  She states it happens at rest or active.  She does have some COPD.  She has had dyspnea with exertion for some time.  There is no change in this.  She states that she has pain that sometimes arises in the chest and has gone up into the left arm.  She denies any nausea vomiting or diarrhea she also denies diaphoresis.  She has been having elevated blood pressure readings over the past month.  There is some family history of cardiac conditions.  Past Medical History:  Diagnosis Date  . Abnormal Pap smear    has had a colposcopy in the past  . Abnormal Pap smear of cervix 06/29/2011  . Breast cancer ([place]) 2000   lumpectomy LEFT  . COPD (chronic obstructive pulmonary disease) ([place])   . Hyperlipidemia    Relevant past medical, surgical, family and social history reviewed and updated as indicated. Interim medical history since our last visit reviewed. Allergies and medications reviewed and updated. DATA REVIEWED: CHART IN EPIC  Family History reviewed for pertinent findings.  Review of Systems  Constitutional: Negative.  Negative for activity change, fatigue and fever.  HENT: Negative.   Eyes: Negative.   Respiratory: Positive for shortness of breath. Negative for cough.   Cardiovascular: Positive for chest pain. Negative for leg swelling.  Gastrointestinal: Negative.  Negative for abdominal pain.  Endocrine: Negative.   Genitourinary: Negative.  Negative for dysuria.  Musculoskeletal: Negative.   Skin: Negative.   Neurological: Negative.     Allergies as of 10/08/2017   No Known Allergies     Medication  List        Accurate as of 10/08/17  9:30 PM. Always use your most recent med list.          albuterol (2.5 MG/3ML) 0.083% nebulizer solution Commonly known as:  PROVENTIL Take 3 mLs (2.5 mg total) by nebulization every 6 (six) hours as needed for wheezing or shortness of breath.   albuterol 108 (90 Base) MCG/ACT inhaler Commonly known as:  PROVENTIL HFA;VENTOLIN HFA Inhale 2 puffs into the lungs every 6 (six) hours as needed for wheezing or shortness of breath. Prefers VENTOLIN   atorvastatin 10 MG tablet Commonly known as:  LIPITOR TAKE ONE (1) TABLET EACH DAY   budesonide-formoterol 160-4.5 MCG/ACT inhaler Commonly known as:  SYMBICORT Inhale 2 puffs into the lungs 2 (two) times daily.   hydrochlorothiazide 25 MG tablet Commonly known as:  HYDRODIURIL Take 1 tablet (25 mg total) by mouth daily.   traZODone 50 MG tablet Commonly known as:  DESYREL Take 50 mg by mouth. Take 1 to 2 at bedtime   traZODone 50 MG tablet Commonly known as:  DESYREL TAKE 1 TO 2 TABLETS AT BEDTIME          Objective:    BP 135/76   Pulse 81   Temp 97.8 F (36.6 C) (Oral)   Ht 5\' 2"  (1.575 m)   Wt 140 lb 9.6 oz (63.8 kg)  BMI 25.72 kg/m   No Known Allergies  Wt Readings from Last 3 Encounters:  10/08/17 140 lb 9.6 oz (63.8 kg)  06/20/17 140 lb 3.2 oz (63.6 kg)  04/02/17 141 lb 12.8 oz (64.3 kg)    Physical Exam  Constitutional: She is oriented to person, place, and time. She appears well-developed and well-nourished.  HENT:  Head: Normocephalic and atraumatic.  Right Ear: Tympanic membrane, external ear and ear canal normal.  Left Ear: Tympanic membrane, external ear and ear canal normal.  Nose: Nose normal. No rhinorrhea.  Mouth/Throat: Oropharynx is clear and moist and mucous membranes are normal. No oropharyngeal exudate or posterior oropharyngeal erythema.  Eyes: Conjunctivae and EOM are normal. Pupils are equal, round, and reactive to light.  Neck: Normal range of  motion. Neck supple.  Cardiovascular: Normal rate, regular rhythm, normal heart sounds and intact distal pulses.  Pulmonary/Chest: Effort normal and breath sounds normal.  Abdominal: Soft. Bowel sounds are normal.  Neurological: She is alert and oriented to person, place, and time. She has normal reflexes.  Skin: Skin is warm and dry. No rash noted.  Psychiatric: She has a normal mood and affect. Her behavior is normal. Judgment and thought content normal.    Results for orders placed or performed in visit on 09/19/16  Microalbumin / creatinine urine ratio  Result Value Ref Range   Creatinine, Urine 34.8 Not Estab. mg/dL   Microalbumin, Urine 6.4 Not Estab. ug/mL   Microalb/Creat Ratio 18.4 0.0 - 30.0 mg/g creat  Basic Metabolic Panel  Result Value Ref Range   Glucose 104 (H) 65 - 99 mg/dL   BUN 25 8 - 27 mg/dL   Creatinine, Ser 1.30 (H) 0.57 - 1.00 mg/dL   GFR calc non Af Amer 40 (L) >59 mL/min/1.73   GFR calc Af Amer 46 (L) >59 mL/min/1.73   BUN/Creatinine Ratio 19 12 - 28   Sodium 140 134 - 144 mmol/L   Potassium 4.8 3.5 - 5.2 mmol/L   Chloride 100 96 - 106 mmol/L   CO2 24 18 - 29 mmol/L   Calcium 9.7 8.7 - 10.3 mg/dL      Assessment & Plan:   1. Nonintractable headache, unspecified chronicity pattern, unspecified headache type  2. Elevated blood pressure reading - Ambulatory referral to Cardiology - hydrochlorothiazide (HYDRODIURIL) 25 MG tablet; Take 1 tablet (25 mg total) by mouth daily.  Dispense: 90 tablet; Refill: 3  3. Atypical chest pain - Ambulatory referral to Cardiology  4. Simple chronic bronchitis (HCC) - albuterol (PROVENTIL HFA;VENTOLIN HFA) 108 (90 Base) MCG/ACT inhaler; Inhale 2 puffs into the lungs every 6 (six) hours as needed for wheezing or shortness of breath. Prefers VENTOLIN  Dispense: 1 Inhaler; Refill: 12 - budesonide-formoterol (SYMBICORT) 160-4.5 MCG/ACT inhaler; Inhale 2 puffs into the lungs 2 (two) times daily.  Dispense: 1 Inhaler; Refill:  12  5. Pain of right hip joint - Ambulatory referral to Orthopedic Surgery  6. Chronic pain of right knee  - Ambulatory referral to Orthopedic Surgery   Continue all other maintenance medications as listed above.  Follow up plan: Return if symptoms worsen or fail to improve.  Educational handout given for [place] PA-C [place] [address]  [place],  [address] [phone]   10/08/2017, 9:30 PM

## 2017-10-17 ENCOUNTER — Ambulatory Visit (INDEPENDENT_AMBULATORY_CARE_PROVIDER_SITE_OTHER): Payer: Medicare HMO

## 2017-10-17 ENCOUNTER — Encounter (INDEPENDENT_AMBULATORY_CARE_PROVIDER_SITE_OTHER): Payer: Self-pay | Admitting: Orthopaedic Surgery

## 2017-10-17 ENCOUNTER — Ambulatory Visit (INDEPENDENT_AMBULATORY_CARE_PROVIDER_SITE_OTHER): Payer: Medicare HMO | Admitting: Orthopaedic Surgery

## 2017-10-17 VITALS — BP 121/64 | HR 73 | Ht 62.0 in | Wt 139.0 lb

## 2017-10-17 DIAGNOSIS — M545 Low back pain: Secondary | ICD-10-CM

## 2017-10-17 DIAGNOSIS — G8929 Other chronic pain: Secondary | ICD-10-CM

## 2017-10-17 NOTE — Progress Notes (Signed)
Office Visit Note   Patient: Angela Parrish           Date of Birth: 03-16-1941           MRN: [id number] Visit Date: 10/17/2017              Requested by: Terald Sleeper, PA-C [address] [place], [place] [address] PCP: Terald Sleeper, PA-C   Assessment & Plan: Visit Diagnoses:  1. Chronic bilateral low back pain without sciatica     Plan: Right thigh and groin pain consistent with the films demonstrating advanced osteoarthritis. Long discussion with Mrs. Branscum over about 45 minutes regarding her pain and different treatment options. For the moment she like to try over-the-counter NSAIDs and Tylenol and exercises. We can always consider cortisone injection in the future. Long discussion regarding definitive treatment of hip replacement. She's not ready for that yet.  Follow-Up Instructions: Return if symptoms worsen or fail to improve.   Orders:  Orders Placed This Encounter  Procedures  . XR Lumbar Spine 2-3 Views  . XR Pelvis 1-2 Views   No orders of the defined types were placed in this encounter.     Procedures: No procedures performed   Clinical Data: No additional findings.   Subjective: Chief Complaint  Patient presents with  . Right Knee - Pain    Mrs Encinas is a 77 yo here for r hip pain that radiates down to the knee with cramping in the thigh muscle. Pt had r knee surgery in 2010 w Dr. Veverly Fells. No injury, inject or xrays.  Mrs. Helmers has had persistent pain in her right thigh. The pain seems to radiate from her right knee extending proximally. This been some pain along the anterior thigh and even laterally. There is no particular pattern to her pain. She has some discomfort when she sleeps and seems to be worse when she is up and around. Not having much groin pain. She denies any back pain. Not having any numbness or tingling.  HPI  Review of Systems  Constitutional: Negative for chills, fatigue and fever.  Eyes: Negative for itching.  Respiratory:  Negative for chest tightness and shortness of breath.   Cardiovascular: Negative for chest pain, palpitations and leg swelling.  Gastrointestinal: Negative for blood in stool, constipation and diarrhea.  Musculoskeletal: Positive for joint swelling. Negative for back pain, neck pain and neck stiffness.  Neurological: Positive for weakness. Negative for numbness.  Hematological: Does not bruise/bleed easily.  Psychiatric/Behavioral: Negative for sleep disturbance. The patient is not nervous/anxious.      Objective: Vital Signs: BP 121/64   Pulse 73   Ht 5\' 2"  (1.575 m)   Wt 139 lb (63 kg)   BMI 25.42 kg/m   Physical Exam  Ortho Exam awake alert and oriented 3. Comfortable sitting. Significant decreased range of motion right hip with internal/external rotation compared to the left knee hip. No knee effusion. No particular localized areas of tenderness about the right knee. Full extension and flexion. Straight leg raise negative. No pain along the lateral aspect of her hip  Specialty Comments:  No specialty comments available.  Imaging: Xr Lumbar Spine 2-3 Views  Result Date: 10/17/2017 Films of the lumbar spine obtained in 2 projections. Is a very minimal left sided degenerative scoliosis. Degenerative changes are identified at the lumbosacral junction. There is mild diffuse calcification of the abdominal aorta without aneurysmal dilatation. Facet joint changes of arthritis or noted at L4-5 and L5-S1. No  listhesis.  Xr Pelvis 1-2 Views  Result Date: 10/17/2017 Films of the lumbar spine were obtained in the AP projection. There is significant osteoarthritis of her right hip with diffuse joint space narrowing. There is cysts on both sides of the joint. The large osteophytes along the femoral head superiorly and inferiorly. Very minimal degenerative change on the left hip. Films are consistent with advanced osteoarthritis right hip    PMFS History: Patient Active Problem List    Diagnosis Date Noted  . Nonintractable headache 10/08/2017  . Pain of right hip joint 10/08/2017  . Chronic pain of right knee 10/08/2017  . Injury of toe on right foot 03/13/2017  . Closed fracture of third toe of right foot 03/13/2017  . Urticaria 01/12/2017  . Weight gain 01/12/2017  . Actinic keratosis 01/12/2017  . Insomnia 07/11/2016  . Body mass index 26.0-26.9, adult 04/07/2016  . Asthma, chronic 04/07/2016  . Chronic bronchitis ([facility]) 04/07/2016  . Hyperlipidemia 04/07/2016  . Primary osteoarthritis of right hip 04/07/2016  . Essential hypertension 11/08/2011  . Breast cancer ([place]) 11/08/2011  . Atypical chest pain 11/08/2011  . Abnormal Pap smear of cervix 06/29/2011   Past Medical History:  Diagnosis Date  . Abnormal Pap smear    has had a colposcopy in the past  . Abnormal Pap smear of cervix 06/29/2011  . Breast cancer ([facility]) 2000   lumpectomy LEFT  . COPD (chronic obstructive pulmonary disease) ([facility])   . Hyperlipidemia     Family History  Problem Relation Age of Onset  . Cancer Father        lung cancer  . Heart disease Father   . Colon cancer Mother   . Heart disease Sister   . Cancer Brother        Leukemia  . Hypertension Brother   . Heart attack Brother   . Obesity Daughter   . Hypertension Brother     Past Surgical History:  Procedure Laterality Date  . BREAST LUMPECTOMY  2000   left breast  . CATARACT EXTRACTION, BILATERAL    . COLONOSCOPY N/A 09/16/2014   Procedure: COLONOSCOPY;  Surgeon: Rogene Houston, MD;  [facility];  Service: Endoscopy;  Laterality: N/A;  830 - moved to 9:15 - Ann to notify  . KNEE CARTILAGE SURGERY Right    Social History   Occupational History  . Not on file  Tobacco Use  . Smoking status: Former Smoker    Types: Cigarettes    Last attempt to quit: 08/14/2007    Years since quitting: 10.1  . Smokeless tobacco: Never Used  Substance and Sexual Activity  . Alcohol use: No  . Drug use: No  . Sexual  activity: Yes    Birth control/protection: Post-menopausal

## 2017-10-23 ENCOUNTER — Encounter: Payer: Self-pay | Admitting: Cardiology

## 2017-10-23 NOTE — Progress Notes (Signed)
Cardiology Office Note  Date: 10/24/2017   ID: Angela Parrish, DOB Oct 24, 1940, MRN [id number]  PCP: Terald Sleeper, PA-C  Consulting cardiologist: Rozann Lesches, MD   Chief Complaint  Patient presents with  . Chest discomfort    History of Present Illness: Angela Parrish is a 77 y.o. female referred for cardiology consultation by Ms. [name] PA-C for the evaluation of chest pain.  She describes the onset of headache and weakness, put a towel on her head and rested, later had a tingling feeling in her left arm and shoulder, also epigastric discomfort but not frank chest pain.  The symptoms waxed and waned for a few days, predominantly headache, and have since completely resolved.  She was concerned about her heart, states that her mother had heart disease.  He also has a personal history of hyperlipidemia and previous tobacco abuse.  Record review finds previous evaluation by Dr. Acie Fredrickson in March 2013 for evaluation of atypical left arm and chest discomfort.  The symptoms were not felt to be cardiac at that point and no additional cardiac testing was recommended.  I personally reviewed her ECG today which shows normal sinus rhythm with R' in lead V1 and V2.  Past Medical History:  Diagnosis Date  . Abnormal Pap smear    Previous colposcopy  . Breast cancer ([facility]) 2000   Status post left lumpectomy  . COPD (chronic obstructive pulmonary disease) ([facility])   . Hyperlipidemia     Past Surgical History:  Procedure Laterality Date  . BREAST LUMPECTOMY  2000   Left breast  . CATARACT EXTRACTION, BILATERAL    . COLONOSCOPY N/A 09/16/2014   Procedure: COLONOSCOPY;  Surgeon: Rogene Houston, MD;  [facility];  Service: Endoscopy;  Laterality: N/A;  830 - moved to 9:15 - Ann to notify  . KNEE CARTILAGE SURGERY Right     Current Outpatient Medications  Medication Sig Dispense Refill  . albuterol (PROVENTIL HFA;VENTOLIN HFA) 108 (90 Base) MCG/ACT inhaler Inhale 2 puffs into the  lungs every 6 (six) hours as needed for wheezing or shortness of breath. Prefers VENTOLIN 1 Inhaler 12  . albuterol (PROVENTIL) (2.5 MG/3ML) 0.083% nebulizer solution Take 3 mLs (2.5 mg total) by nebulization every 6 (six) hours as needed for wheezing or shortness of breath. 360 mL 12  . atorvastatin (LIPITOR) 10 MG tablet TAKE ONE (1) TABLET EACH DAY 90 tablet 1  . budesonide-formoterol (SYMBICORT) 160-4.5 MCG/ACT inhaler Inhale 2 puffs into the lungs 2 (two) times daily. 1 Inhaler 12  . hydrochlorothiazide (HYDRODIURIL) 25 MG tablet Take 1 tablet (25 mg total) by mouth daily. 90 tablet 3  . traZODone (DESYREL) 50 MG tablet TAKE 1 TO 2 TABLETS AT BEDTIME 180 tablet 3   No current facility-administered medications for this visit.    Allergies:  Patient has no known allergies.   Social History: The patient  reports that she quit smoking about 10 years ago. Her smoking use included cigarettes. she has never used smokeless tobacco. She reports that she does not drink alcohol or use drugs.   Family History: The patient's family history includes Colon cancer in her mother; Heart attack in her brother; Heart disease in her father and sister; Hypertension in her brother and brother; Leukemia in her brother; Lung cancer in her father; Obesity in her daughter.   ROS:  Please see the history of present illness. Otherwise, complete review of systems is positive for chronic shortness of breath.  All other  systems are reviewed and negative.   Physical Exam: VS:  BP 118/68   Pulse 64   Ht 5\' 2"  (1.575 m)   Wt 140 lb (63.5 kg)   SpO2 96%   BMI 25.61 kg/m , BMI Body mass index is 25.61 kg/m.  Wt Readings from Last 3 Encounters:  10/24/17 140 lb (63.5 kg)  10/17/17 139 lb (63 kg)  10/08/17 140 lb 9.6 oz (63.8 kg)    General: Patient appears comfortable at rest. HEENT: Conjunctiva and lids normal, oropharynx clear. Neck: Supple, no elevated JVP or carotid bruits, no thyromegaly. Lungs: Diminished  breath sounds without wheezing, nonlabored breathing at rest. Cardiac: Regular rate and rhythm, no S3, 2/6 systolic murmur, no pericardial rub. Abdomen: Soft, nontender, bowel sounds present, no guarding or rebound. Extremities: No pitting edema, distal pulses 2+. Skin: Warm and dry. Musculoskeletal: No kyphosis. Neuropsychiatric: Alert and oriented x3, affect grossly appropriate.  ECG: I personally reviewed the tracing from 11/08/2011 which showed normal sinus rhythm.  Recent Labwork:    Component Value Date/Time   CHOL 206 (H) 07/11/2016 1056   TRIG 107 07/11/2016 1056   HDL 94 07/11/2016 1056   CHOLHDL 2.2 07/11/2016 1056   LDLCALC 91 07/11/2016 1056   Assessment and Plan:  1.  Episode of left arm tingling and epigastric discomfort that followed headache and weak feeling.  The symptoms have resolved at this point.  I reviewed her ECG which is essentially normal.  She does have a previous history of tobacco use, hyperlipidemia, also reported heart disease in her mother.  She underwent cardiology evaluation back in 2013, although no additional testing was undertaken at that point.  Plan will be to obtain an echocardiogram as well as a Lexiscan Myoview for further objective cardiac assessment.  2.  Systolic murmur, echocardiogram will be obtained.  3.  Mixed hyperlipidemia, on Lipitor.  4.  COPD with previous history of tobacco abuse.  She reports chronic shortness of breath.  Current medicines were reviewed with the patient today.   Orders Placed This Encounter  Procedures  . NM Myocar Multi W/Spect W/Wall Motion / EF  . EKG 12-Lead  . ECHOCARDIOGRAM COMPLETE    Disposition: Call with test results.  Signed, Satira Sark, MD, [facility] 10/24/2017 11:09 AM    [facility] [place], [place], [place] [address] Phone: [phone]; Fax: [phone]

## 2017-10-24 ENCOUNTER — Ambulatory Visit: Payer: Medicare HMO | Admitting: Cardiology

## 2017-10-24 ENCOUNTER — Telehealth: Payer: Self-pay | Admitting: Cardiology

## 2017-10-24 ENCOUNTER — Encounter: Payer: Self-pay | Admitting: Cardiology

## 2017-10-24 VITALS — BP 118/68 | HR 64 | Ht 62.0 in | Wt 140.0 lb

## 2017-10-24 DIAGNOSIS — J449 Chronic obstructive pulmonary disease, unspecified: Secondary | ICD-10-CM

## 2017-10-24 DIAGNOSIS — E782 Mixed hyperlipidemia: Secondary | ICD-10-CM | POA: Diagnosis not present

## 2017-10-24 DIAGNOSIS — R0602 Shortness of breath: Secondary | ICD-10-CM | POA: Diagnosis not present

## 2017-10-24 DIAGNOSIS — R011 Cardiac murmur, unspecified: Secondary | ICD-10-CM | POA: Diagnosis not present

## 2017-10-24 DIAGNOSIS — R0789 Other chest pain: Secondary | ICD-10-CM

## 2017-10-24 NOTE — Telephone Encounter (Signed)
Pre-cert Verification for the following procedure   Echo scheduled for 10/30/2017  [facility] Office  [place] scheduled 10/31/2017 at [facility]

## 2017-10-24 NOTE — Patient Instructions (Signed)
Medication Instructions:  Your physician recommends that you continue on your current medications as directed. Please refer to the Current Medication list given to you today.  Labwork: NONE  Testing/Procedures: Your physician has requested that you have an echocardiogram. Echocardiography is a painless test that uses sound waves to create images of your heart. It provides your doctor with information about the size and shape of your heart and how well your heart's chambers and valves are working. This procedure takes approximately one hour. There are no restrictions for this procedure.  Your physician has requested that you have a lexiscan myoview. For further information please visit www.[email or web address]. Please follow instruction sheet, as given.  Follow-Up: Your physician recommends that you schedule a follow-up appointment PENDING TEST RESULTS  Any Other Special Instructions Will Be Listed Below (If Applicable).  If you need a refill on your cardiac medications before your next appointment, please call your pharmacy. 

## 2017-10-30 ENCOUNTER — Other Ambulatory Visit: Payer: Self-pay

## 2017-10-30 ENCOUNTER — Ambulatory Visit (INDEPENDENT_AMBULATORY_CARE_PROVIDER_SITE_OTHER): Payer: Medicare HMO

## 2017-10-30 DIAGNOSIS — R0602 Shortness of breath: Secondary | ICD-10-CM | POA: Diagnosis not present

## 2017-10-30 DIAGNOSIS — R0789 Other chest pain: Secondary | ICD-10-CM

## 2017-10-31 ENCOUNTER — Encounter (HOSPITAL_COMMUNITY): Payer: Self-pay

## 2017-10-31 ENCOUNTER — Encounter (HOSPITAL_BASED_OUTPATIENT_CLINIC_OR_DEPARTMENT_OTHER)
Admission: RE | Admit: 2017-10-31 | Discharge: 2017-10-31 | Disposition: A | Discharge status: Home / Self Care | Payer: Medicare HMO | Source: Ambulatory Visit | Attending: Cardiology | Admitting: Cardiology

## 2017-10-31 ENCOUNTER — Ambulatory Visit (HOSPITAL_COMMUNITY)
Admission: RE | Admit: 2017-10-31 | Discharge: 2017-10-31 | Disposition: A | Discharge status: Home / Self Care | Payer: Medicare HMO | Source: Ambulatory Visit | Attending: Cardiology | Admitting: Cardiology

## 2017-10-31 DIAGNOSIS — R0602 Shortness of breath: Secondary | ICD-10-CM | POA: Diagnosis not present

## 2017-10-31 DIAGNOSIS — R0789 Other chest pain: Secondary | ICD-10-CM

## 2017-10-31 HISTORY — DX: Unspecified asthma, uncomplicated: J45.909

## 2017-10-31 LAB — NM MYOCAR MULTI W/SPECT W/WALL MOTION / EF
CHL CUP NUCLEAR SDS: 1
CHL CUP RESTING HR STRESS: 60 {beats}/min
LHR: 0.45
LV dias vol: 49 mL (ref 46–106)
LVSYSVOL: 9 mL
Peak HR: 86 {beats}/min
SRS: 1
SSS: 2
TID: 0.98

## 2017-10-31 MED ORDER — REGADENOSON 0.4 MG/5ML IV SOLN
INTRAVENOUS | Status: AC
  Administered 2017-10-31: 0.4 mg via INTRAVENOUS
  Filled 2017-10-31: qty 5

## 2017-10-31 MED ORDER — TECHNETIUM TC 99M TETROFOSMIN IV KIT
30.0000 | PACK | Freq: Once | INTRAVENOUS | Status: AC | PRN
  Administered 2017-10-31: 33 via INTRAVENOUS

## 2017-10-31 MED ORDER — TECHNETIUM TC 99M TETROFOSMIN IV KIT
10.0000 | PACK | Freq: Once | INTRAVENOUS | Status: AC | PRN
  Administered 2017-10-31: 11 via INTRAVENOUS

## 2017-10-31 MED ORDER — SODIUM CHLORIDE 0.9% FLUSH
INTRAVENOUS | Status: AC
  Administered 2017-10-31: 10 mL via INTRAVENOUS
  Filled 2017-10-31: qty 10

## 2017-11-01 ENCOUNTER — Telehealth: Payer: Self-pay

## 2017-11-01 NOTE — Telephone Encounter (Signed)
-----   Message from Merlene Laughter, LPN sent at 05/14/2682  8:38 AM EDT -----   ----- Message ----- From: Satira Sark, MD Sent: 10/31/2017   8:25 AM To: Merlene Laughter, LPN, Terald Sleeper, PA-C  Results reviewed.  LVEF normal range at 60-65%, no major valvular abnormalities.  Await stress test results. A copy of this test should be forwarded to Terald Sleeper, PA-C.

## 2017-11-01 NOTE — Telephone Encounter (Signed)
Patient notified. Routed to PCP 

## 2017-11-01 NOTE — Telephone Encounter (Signed)
-----   Message from Merlene Laughter, LPN sent at 6/75/4492  8:38 AM EDT -----   ----- Message ----- From: Satira Sark, MD Sent: 10/31/2017   2:58 PM To: Merlene Laughter, LPN, Terald Sleeper, PA-C  Results reviewed.  Please let her know that the stress test was reassuring, low risk findings.  No further cardiac testing planned at this time unless symptoms worsen.  Keep follow-up with PCP. A copy of this test should be forwarded to Terald Sleeper, PA-C.

## 2018-01-15 ENCOUNTER — Other Ambulatory Visit: Payer: Self-pay | Admitting: Physician Assistant

## 2018-01-15 DIAGNOSIS — E785 Hyperlipidemia, unspecified: Secondary | ICD-10-CM

## 2018-02-11 ENCOUNTER — Other Ambulatory Visit: Payer: Self-pay | Admitting: Physician Assistant

## 2018-02-11 ENCOUNTER — Telehealth: Payer: Self-pay

## 2018-02-11 DIAGNOSIS — M25561 Pain in right knee: Secondary | ICD-10-CM

## 2018-02-11 DIAGNOSIS — G8929 Other chronic pain: Secondary | ICD-10-CM

## 2018-02-11 DIAGNOSIS — M25551 Pain in right hip: Secondary | ICD-10-CM

## 2018-02-11 NOTE — Telephone Encounter (Signed)
Wants a referral to Dr Veverly Fells for knee and hip pain

## 2018-02-11 NOTE — Telephone Encounter (Signed)
Patient aware.

## 2018-02-11 NOTE — Telephone Encounter (Signed)
Order has been placed.

## 2018-02-20 ENCOUNTER — Ambulatory Visit (INDEPENDENT_AMBULATORY_CARE_PROVIDER_SITE_OTHER): Payer: Medicare HMO | Admitting: Orthopaedic Surgery

## 2018-02-26 DIAGNOSIS — M25551 Pain in right hip: Secondary | ICD-10-CM | POA: Diagnosis not present

## 2018-02-26 DIAGNOSIS — M25561 Pain in right knee: Secondary | ICD-10-CM | POA: Diagnosis not present

## 2018-03-07 DIAGNOSIS — G44219 Episodic tension-type headache, not intractable: Secondary | ICD-10-CM | POA: Diagnosis not present

## 2018-03-07 DIAGNOSIS — H40033 Anatomical narrow angle, bilateral: Secondary | ICD-10-CM | POA: Diagnosis not present

## 2018-03-21 ENCOUNTER — Other Ambulatory Visit: Payer: Self-pay | Admitting: Physician Assistant

## 2018-03-21 DIAGNOSIS — Z1231 Encounter for screening mammogram for malignant neoplasm of breast: Secondary | ICD-10-CM

## 2018-04-23 ENCOUNTER — Encounter: Payer: Self-pay | Admitting: Physician Assistant

## 2018-04-23 ENCOUNTER — Other Ambulatory Visit: Payer: Self-pay | Admitting: Physician Assistant

## 2018-04-23 ENCOUNTER — Ambulatory Visit (INDEPENDENT_AMBULATORY_CARE_PROVIDER_SITE_OTHER): Payer: Medicare HMO | Admitting: Physician Assistant

## 2018-04-23 VITALS — BP 134/75 | HR 87 | Temp 97.7°F | Ht 62.0 in | Wt 139.6 lb

## 2018-04-23 DIAGNOSIS — J0110 Acute frontal sinusitis, unspecified: Secondary | ICD-10-CM

## 2018-04-23 DIAGNOSIS — J209 Acute bronchitis, unspecified: Secondary | ICD-10-CM

## 2018-04-23 DIAGNOSIS — E785 Hyperlipidemia, unspecified: Secondary | ICD-10-CM

## 2018-04-23 MED ORDER — METHYLPREDNISOLONE ACETATE 80 MG/ML IJ SUSP
80.0000 mg | Freq: Once | INTRAMUSCULAR | Status: AC
  Administered 2018-04-23: 80 mg via INTRAMUSCULAR

## 2018-04-23 MED ORDER — CEFDINIR 300 MG PO CAPS: 300.0000 mg | ORAL_CAPSULE | Freq: Two times a day (BID) | ORAL | 0 refills | Status: DC

## 2018-04-26 DIAGNOSIS — M25551 Pain in right hip: Secondary | ICD-10-CM | POA: Diagnosis not present

## 2018-04-26 NOTE — Progress Notes (Signed)
Pulse 87   Temp 97.7 F (36.5 C) (Oral)   Ht 5\' 2"  (1.575 m)   Wt 139 lb 9.6 oz (63.3 kg)   SpO2 97%   BMI 25.53 kg/m    Subjective:    Patient ID: Angela Parrish, female    DOB: Jan 15, 1941, 77 y.o.   MRN: [id number]  HPI: Angela Parrish is a 77 y.o. female presenting on 04/23/2018 for Sinusitis and Cough  This patient has had many days of sinus headache and postnasal drainage. There is copious drainage at times. Denies any fever at this time. There has been a history of sinus infections in the past.  No history of sinus surgery. There is cough at night. It has become more prevalent in recent days.   Past Medical History:  Diagnosis Date  . Abnormal Pap smear    Previous colposcopy  . Asthma   . Breast cancer ([place]) 2000   Status post left lumpectomy  . COPD (chronic obstructive pulmonary disease) ([place])   . Hyperlipidemia   . Hypertension    Relevant past medical, surgical, family and social history reviewed and updated as indicated. Interim medical history since our last visit reviewed. Allergies and medications reviewed and updated. DATA REVIEWED: CHART IN EPIC  Family History reviewed for pertinent findings.  Review of Systems  Constitutional: Positive for chills and fatigue. Negative for activity change, appetite change and fever.  HENT: Positive for congestion, postnasal drip, sinus pressure, sinus pain and sore throat.   Eyes: Negative.   Respiratory: Positive for cough and wheezing. Negative for shortness of breath.   Cardiovascular: Negative.  Negative for chest pain, palpitations and leg swelling.  Gastrointestinal: Negative.   Genitourinary: Negative.   Musculoskeletal: Negative.   Skin: Negative.   Neurological: Positive for headaches.    Allergies as of 04/23/2018   No Known Allergies     Medication List        Accurate as of 04/23/18 11:59 PM. Always use your most recent med list.          albuterol (2.5 MG/3ML) 0.083% nebulizer solution Commonly  known as:  PROVENTIL Take 3 mLs (2.5 mg total) by nebulization every 6 (six) hours as needed for wheezing or shortness of breath.   albuterol 108 (90 Base) MCG/ACT inhaler Commonly known as:  PROVENTIL HFA;VENTOLIN HFA Inhale 2 puffs into the lungs every 6 (six) hours as needed for wheezing or shortness of breath. Prefers VENTOLIN   atorvastatin 10 MG tablet Commonly known as:  LIPITOR TAKE ONE (1) TABLET EACH DAY   budesonide-formoterol 160-4.5 MCG/ACT inhaler Commonly known as:  SYMBICORT Inhale 2 puffs into the lungs 2 (two) times daily.   cefdinir 300 MG capsule Commonly known as:  OMNICEF Take 1 capsule (300 mg total) by mouth 2 (two) times daily. 1 po BID   hydrochlorothiazide 25 MG tablet Commonly known as:  HYDRODIURIL Take 1 tablet (25 mg total) by mouth daily.          Objective:    Pulse 87   Temp 97.7 F (36.5 C) (Oral)   Ht 5\' 2"  (1.575 m)   Wt 139 lb 9.6 oz (63.3 kg)   SpO2 97%   BMI 25.53 kg/m   No Known Allergies  Wt Readings from Last 3 Encounters:  04/23/18 139 lb 9.6 oz (63.3 kg)  10/24/17 140 lb (63.5 kg)  10/17/17 139 lb (63 kg)    Physical Exam  Constitutional: She is oriented to person, place, and  time. She appears well-developed and well-nourished.  HENT:  Head: Normocephalic and atraumatic.  Right Ear: Tympanic membrane and external ear normal. No middle ear effusion.  Left Ear: Tympanic membrane and external ear normal.  No middle ear effusion.  Nose: Mucosal edema and rhinorrhea present. Right sinus exhibits no maxillary sinus tenderness. Left sinus exhibits no maxillary sinus tenderness.  Mouth/Throat: Uvula is midline. Posterior oropharyngeal erythema present.  Eyes: Pupils are equal, round, and reactive to light. Conjunctivae and EOM are normal. Right eye exhibits no discharge. Left eye exhibits no discharge.  Neck: Normal range of motion.  Cardiovascular: Normal rate, regular rhythm and normal heart sounds.  Pulmonary/Chest: Effort  normal and breath sounds normal. No respiratory distress. She has no wheezes.  Abdominal: Soft.  Lymphadenopathy:    She has no cervical adenopathy.  Neurological: She is alert and oriented to person, place, and time.  Skin: Skin is warm and dry.  Psychiatric: She has a normal mood and affect.        Assessment & Plan:   1. Acute non-recurrent frontal sinusitis - methylPREDNISolone acetate (DEPO-MEDROL) injection 80 mg - cefdinir (OMNICEF) 300 MG capsule; Take 1 capsule (300 mg total) by mouth 2 (two) times daily. 1 po BID  Dispense: 20 capsule; Refill: 0  2. Acute bronchitis, unspecified organism - methylPREDNISolone acetate (DEPO-MEDROL) injection 80 mg - cefdinir (OMNICEF) 300 MG capsule; Take 1 capsule (300 mg total) by mouth 2 (two) times daily. 1 po BID  Dispense: 20 capsule; Refill: 0   Continue all other maintenance medications as listed above.  Follow up plan: No follow-ups on file.  Educational handout given for [place] PA-C [place] [address]  [place], [place] [address] [phone]   04/26/2018, 8:16 AM

## 2018-05-01 ENCOUNTER — Ambulatory Visit
Admission: RE | Admit: 2018-05-01 | Discharge: 2018-05-01 | Disposition: A | Discharge status: Home / Self Care | Payer: Medicare HMO | Source: Ambulatory Visit | Attending: Physician Assistant | Admitting: Physician Assistant

## 2018-05-01 DIAGNOSIS — Z1231 Encounter for screening mammogram for malignant neoplasm of breast: Secondary | ICD-10-CM | POA: Diagnosis not present

## 2018-05-01 HISTORY — DX: Personal history of irradiation: Z92.3

## 2018-05-15 ENCOUNTER — Ambulatory Visit: Payer: Medicare HMO

## 2018-05-17 ENCOUNTER — Ambulatory Visit (INDEPENDENT_AMBULATORY_CARE_PROVIDER_SITE_OTHER): Payer: Medicare HMO | Admitting: *Deleted

## 2018-05-17 ENCOUNTER — Encounter: Payer: Self-pay | Admitting: *Deleted

## 2018-05-17 VITALS — BP 136/69 | HR 71 | Ht 63.25 in | Wt 140.0 lb

## 2018-05-17 DIAGNOSIS — Z0000 Encounter for general adult medical examination without abnormal findings: Secondary | ICD-10-CM

## 2018-05-17 DIAGNOSIS — Z780 Asymptomatic menopausal state: Secondary | ICD-10-CM

## 2018-05-17 DIAGNOSIS — Z23 Encounter for immunization: Secondary | ICD-10-CM | POA: Diagnosis not present

## 2018-05-17 NOTE — Patient Instructions (Signed)
  Angela Parrish , Thank you for taking time to come for your Medicare Wellness Visit. I appreciate your ongoing commitment to your health goals. Please review the following plan we discussed and let me know if I can assist you in the future.   These are the goals we discussed: Goals    . Exercise 150 min/wk Moderate Activity     After hip surgery increase activity level slowly. Aim for at least 150 minutes a week.    . Exercise 3x per week (30 min per time)    . Have 3 meals a day       This is a list of the screening recommended for you and due dates:  Health Maintenance  Topic Date Due  . DEXA scan (bone density measurement)  04/28/2005  . Flu Shot  03/21/2018  . Tetanus Vaccine  11/23/2026  . Pneumonia vaccines  Completed

## 2018-05-17 NOTE — Progress Notes (Signed)
Subjective:   Angela Parrish is a 77 y.o. female who presents for a Medicare Annual Wellness Visit. Angela Parrish lives at home with her boyfriend of 13 years. Her husband passed away suddenly from a heart attack after 37 years of marriage. She has one daughter and she lives close by. Her 2 adult grandsons live next door their mother. She is able to talk and visit with them frequently. She retired from [employer] at age 78 after working for them 26 years. She enjoys crafting and gets a lot of ideas from [place]. She also enjoys going to [employer] Her boyfriend, Dominica Severin, accompanies her most of the time. She is semi active at this time due to needing right hip replacement for degeneration, arthritis, and bone spurs. She is scheduled for surgery in November with Dr Wynelle Link. Dominica Severin takes care of all of the yardwork and she manages the housework. He also helps around the house and carries the laundry up and down the stairs so she doesn't have to. Her daughter is planning to take a week off after her surgery so that she can help out too. She was walking 2.5 miles every morning before her hip started giving her trouble. She would use her nebulizer prior to walking and the inhaler afterwards. Typically had to stop and sit for a few minutes during the walk. She plans on resuming this once she recovers from her hip replacement.   Review of Systems    Patient reports that her overall health is unchanged compared to last year.  Cardiac Risk Factors include: advanced age (>50men, >37 women);hypertension;dyslipidemia  Musculoskeletal: right hip pain due to degeneration, arthritis, and spurs. Scheduled for hip replacement with Dr Wynelle Link in November of this year. She has some pain and limited range of motion. Gait is off because of this as well.   Respiratory: COPD. History of smoking, radiation treatment for breast cancer, and working around [employer]. Uses symbicort daily and usually uses nebulizer daily. Usually tries to  stay inside when Poll[place] is high  Gyn: Breast cancer in 2000. Had lumpectomy and radiation. Normal mammogram this month. Has them yearly.   All other systems negative     Current Medications (verified) Outpatient Encounter Medications as of 05/17/2018  Medication Sig  . albuterol (PROVENTIL HFA;VENTOLIN HFA) 108 (90 Base) MCG/ACT inhaler Inhale 2 puffs into the lungs every 6 (six) hours as needed for wheezing or shortness of breath. Prefers VENTOLIN  . albuterol (PROVENTIL) (2.5 MG/3ML) 0.083% nebulizer solution Take 3 mLs (2.5 mg total) by nebulization every 6 (six) hours as needed for wheezing or shortness of breath.  Marland Kitchen atorvastatin (LIPITOR) 10 MG tablet TAKE ONE (1) TABLET EACH DAY  . budesonide-formoterol (SYMBICORT) 160-4.5 MCG/ACT inhaler Inhale 2 puffs into the lungs 2 (two) times daily.  . hydrochlorothiazide (HYDRODIURIL) 25 MG tablet Take 1 tablet (25 mg total) by mouth daily.  . cefdinir (OMNICEF) 300 MG capsule Take 1 capsule (300 mg total) by mouth 2 (two) times daily. 1 po BID   No facility-administered encounter medications on file as of 05/17/2018.     Allergies (verified) Patient has no known allergies.   History: Past Medical History:  Diagnosis Date  . Abnormal Pap smear    Previous colposcopy  . Asthma   . Breast cancer ([facility]) 2000   Status post left lumpectomy  . COPD (chronic obstructive pulmonary disease) ([place])   . Hyperlipidemia   . Hypertension   . Personal history of radiation therapy 2000  Past Surgical History:  Procedure Laterality Date  . BREAST LUMPECTOMY  2000   Left breast  . CATARACT EXTRACTION, BILATERAL    . COLONOSCOPY N/A 09/16/2014   Procedure: COLONOSCOPY;  Surgeon: Rogene Houston, MD;  Location: [facility];  Service: Endoscopy;  Laterality: N/A;  830 - moved to 9:15 - Ann to notify  . KNEE CARTILAGE SURGERY Right    Family History  Problem Relation Age of Onset  . Heart disease Father   . Lung cancer Father        smoker  .  Heart attack Father   . Colon cancer Mother 46  . Heart attack Mother   . Heart disease Sister   . Hypertension Brother   . Heart attack Brother   . Leukemia Brother   . Obesity Daughter   . Anxiety disorder Daughter   . Hypertension Brother    Social History   Socioeconomic History  . Marital status: Soil scientist    Spouse name: Not on file  . Number of children: 1  . Years of education: 34  . Highest education level: Some college, no degree  Occupational History  . Occupation: Retired    Comment: [employer]  Social Needs  . Financial resource strain: Not very hard  . Food insecurity:    Worry: Never true    Inability: Never true  . Transportation needs:    Medical: No    Non-medical: No  Tobacco Use  . Smoking status: Former Smoker    Types: Cigarettes    Last attempt to quit: 08/14/2007    Years since quitting: 10.7  . Smokeless tobacco: Never Used  Substance and Sexual Activity  . Alcohol use: No  . Drug use: No  . Sexual activity: Yes    Birth control/protection: Post-menopausal  Lifestyle  . Physical activity:    Days per week: 0 days    Minutes per session: 0 min  . Stress: Only a little  Relationships  . Social connections:    Talks on phone: More than three times a week    Gets together: More than three times a week    Attends religious service: Never    Active member of club or organization: No    Attends meetings of clubs or organizations: Never    Relationship status: Living with partner  Other Topics Concern  . Not on file  Social History Narrative  . Not on file    Tobacco Use No.  Clinical Intake:     Pain : 0-10 Pain Score: 2  Pain Type: Chronic pain Pain Location: Hip Pain Orientation: Right Pain Descriptors / Indicators: Aching Pain Onset: More than a month ago Pain Frequency: Constant Effect of Pain on Daily Activities: Minimal     Nutritional Status: BMI of 19-24  Normal Diabetes: No  What is the last grade level you  completed in school?: 6 months of business school after high school  Interpreter Needed?: No  Information entered by :: Chong Sicilian, RN   Activities of Daily Living In your present state of health, do you have any difficulty performing the following activities: 05/17/2018  Hearing? N  Vision? N  Difficulty concentrating or making decisions? N  Walking or climbing stairs? Y  Dressing or bathing? N  Doing errands, shopping? N  Preparing Food and eating ? N  Using the Toilet? N  In the past six months, have you accidently leaked urine? N  Do you have problems with loss of  bowel control? N  Managing your Medications? N  Managing your Finances? N  Housekeeping or managing your Housekeeping? N  Some recent data might be hidden     Diet 3 meals Breakfast-biscuit Lunch-sandwich or light lunch Supper-Cooks supper   Exercise Current Exercise Habits: The patient does not participate in regular exercise at present, Exercise limited by: orthopedic condition(s)   Depression Screen PHQ 2/9 Scores 05/17/2018 04/23/2018 10/08/2017 06/20/2017 04/02/2017 03/13/2017 01/12/2017  PHQ - 2 Score 0 0 0 0 0 0 0  PHQ- 9 Score - - - - - - -     Fall Risk Fall Risk  05/17/2018 04/23/2018 10/08/2017 06/20/2017 04/02/2017  Falls in the past year? No No No No No    Safety Is the patient's home free of loose throw rugs in walkways, pet beds, electrical cords, etc?   yes       Handrails on the stairs?   yes      Adequate lighting?   yes  Patient Care Team: Theodoro Clock as PCP - General (General Practice) Rogene Houston, MD as Consulting Physician (Gastroenterology) Gaynelle Arabian, MD as Consulting Physician (Orthopedic Surgery)   No hospitalizations, ER visits, or surgeries this past year.  No surgeries, hospitalizations, or ER visits over the past 12 months.  Objective:    Today's Vitals   05/17/18 0843 05/17/18 0846  BP: 136/69   Pulse: 71   Weight: 140 lb (63.5 kg)   Height: 5'  3.25" (1.607 m)   PainSc:  2    Body mass index is 24.6 kg/m.  Advanced Directives 05/17/2018 11/22/2016  Does Patient Have a Medical Advance Directive? No No  Would patient like information on creating a medical advance directive? - Yes (MAU/Ambulatory/Procedural Areas - Information given)    Hearing/Vision  No hearing or vision deficits noted during visit.  Cognitive Function: MMSE - Mini Mental State Exam 05/17/2018 11/22/2016  Orientation to time 5 5  Orientation to Place 5 5  Registration 3 3  Attention/ Calculation 5 5  Recall 2 3  Language- name 2 objects 2 2  Language- repeat 1 1  Language- follow 3 step command 3 3  Language- read & follow direction 1 1  Write a sentence 1 1  Copy design 1 1  Total score 29 30       Normal Cognitive Function Screening: Yes    Immunizations and Health Maintenance Immunization History  Administered Date(s) Administered  . Influenza, High Dose Seasonal PF 07/11/2016, 06/13/2017  . Pneumococcal Conjugate-13 06/20/2017  . Pneumococcal Polysaccharide-23 06/25/2014  . Tdap 11/22/2016  . Zoster 09/19/2008   Health Maintenance Due  Topic Date Due  . DEXA SCAN  04/28/2005  . INFLUENZA VACCINE  03/21/2018   Health Maintenance  Topic Date Due  . DEXA SCAN  04/28/2005  . INFLUENZA VACCINE  03/21/2018  . TETANUS/TDAP  11/23/2026  . PNA vac Low Risk Adult  Completed        Assessment:   This is a routine wellness examination for Matty.    Plan:    Goals    . Exercise 150 min/wk Moderate Activity     After hip surgery increase activity level slowly. Aim for at least 150 minutes a week.    . Exercise 3x per week (30 min per time)    . Have 3 meals a day        Health Maintenance Recommendations: Influenza vaccine Bone densitometry screening   Additional Screening Recommendations: Lung:  Low Dose CT Chest recommended if Age 64-80 years, 30 pack-year currently smoking OR have quit w/in 15years. Patient does not  qualify. Hepatitis C Screening recommended: no  Today's Orders Orders Placed This Encounter  Procedures  . [facility] DEXA    Scheduling Instructions:     Call to schedule    Order Specific Question:   Reason for Exam (SYMPTOM  OR DIAGNOSIS REQUIRED)    Answer:   post menopausal    Keep f/u with Terald Sleeper, PA-C and any other specialty appointments you may have Continue current medications Move carefully to avoid falls. Use assistive devices like a cane or walker if needed. Aim for at least 150 minutes of moderate activity a week. This can be done with chair exercises if necessary. Read or work on puzzles daily Stay connected with friends and family Flu shot given today  I have personally reviewed and noted the following in the patient's chart:   . Medical and social history . Use of alcohol, tobacco or illicit drugs  . Current medications and supplements . Functional ability and status . Nutritional status . Physical activity . Advanced directives . List of other physicians . Hospitalizations, surgeries, and ER visits in previous 12 months . Vitals . Screenings to include cognitive, depression, and falls . Referrals and appointments  In addition, I have reviewed and discussed with patient certain preventive protocols, quality metrics, and best practice recommendations. A written personalized care plan for preventive services as well as general preventive health recommendations were provided to patient.     Chong Sicilian, RN   05/17/2018

## 2018-06-21 HISTORY — PX: JOINT REPLACEMENT: SHX530

## 2018-07-02 NOTE — H&P (Signed)
TOTAL HIP ADMISSION H&P  Patient is admitted for right total hip arthroplasty.  Subjective:  Chief Complaint: right hip pain  HPI: Angela Parrish, 77 y.o. female, has a history of pain and functional disability in the right hip(s) due to arthritis and patient has failed non-surgical conservative treatments for greater than 12 weeks to include NSAID's and/or analgesics and activity modification.  Onset of symptoms was abrupt starting 1 years ago with rapidly worsening course since that time.The patient noted no past surgery on the right hip(s).  Patient currently rates pain in the right hip at 10 out of 10 with activity. Patient has worsening of pain with activity and weight bearing and stiffness. Patient has evidence of severe end-stage bone-on-bone arthritis with a very large subchondral cyst in the acetabulum and massive osteophytes at the joint margin by imaging studies. This condition presents safety issues increasing the risk of falls. There is no current active infection.  Patient Active Problem List   Diagnosis Date Noted  . Nonintractable headache 10/08/2017  . Pain of right hip joint 10/08/2017  . Chronic pain of right knee 10/08/2017  . Injury of toe on right foot 03/13/2017  . Closed fracture of third toe of right foot 03/13/2017  . Urticaria 01/12/2017  . Weight gain 01/12/2017  . Actinic keratosis 01/12/2017  . Insomnia 07/11/2016  . Body mass index 26.0-26.9, adult 04/07/2016  . Asthma, chronic 04/07/2016  . Chronic bronchitis ([place]) 04/07/2016  . Hyperlipidemia 04/07/2016  . Primary osteoarthritis of right hip 04/07/2016  . Essential hypertension 11/08/2011  . Breast cancer ([place]) 11/08/2011  . Atypical chest pain 11/08/2011  . Abnormal Pap smear of cervix 06/29/2011   Past Medical History:  Diagnosis Date  . Abnormal Pap smear    Previous colposcopy  . Asthma   . Breast cancer ([place]) 2000   Status post left lumpectomy  . COPD (chronic obstructive pulmonary disease)  ([place])   . Hyperlipidemia   . Hypertension   . Personal history of radiation therapy 2000    Past Surgical History:  Procedure Laterality Date  . BREAST LUMPECTOMY  2000   Left breast  . CATARACT EXTRACTION, BILATERAL    . COLONOSCOPY N/A 09/16/2014   Procedure: COLONOSCOPY;  Surgeon: Rogene Houston, MD;  Location: [facility];  Service: Endoscopy;  Laterality: N/A;  830 - moved to 9:15 - Ann to notify  . KNEE CARTILAGE SURGERY Right     No current facility-administered medications for this encounter.    Current Outpatient Medications  Medication Sig Dispense Refill Last Dose  . albuterol (PROVENTIL HFA;VENTOLIN HFA) 108 (90 Base) MCG/ACT inhaler Inhale 2 puffs into the lungs every 6 (six) hours as needed for wheezing or shortness of breath. Prefers VENTOLIN 1 Inhaler 12 Taking  . albuterol (PROVENTIL) (2.5 MG/3ML) 0.083% nebulizer solution Take 3 mLs (2.5 mg total) by nebulization every 6 (six) hours as needed for wheezing or shortness of breath. 360 mL 12 Taking  . atorvastatin (LIPITOR) 10 MG tablet TAKE ONE (1) TABLET EACH DAY 30 tablet 0 Taking  . budesonide-formoterol (SYMBICORT) 160-4.5 MCG/ACT inhaler Inhale 2 puffs into the lungs 2 (two) times daily. 1 Inhaler 12 Taking  . cefdinir (OMNICEF) 300 MG capsule Take 1 capsule (300 mg total) by mouth 2 (two) times daily. 1 po BID 20 capsule 0   . hydrochlorothiazide (HYDRODIURIL) 25 MG tablet Take 1 tablet (25 mg total) by mouth daily. 90 tablet 3 Taking   No Known Allergies  Social History  Tobacco Use  . Smoking status: Former Smoker    Types: Cigarettes    Last attempt to quit: 08/14/2007    Years since quitting: 10.8  . Smokeless tobacco: Never Used  Substance Use Topics  . Alcohol use: No    Family History  Problem Relation Age of Onset  . Heart disease Father   . Lung cancer Father        smoker  . Heart attack Father   . Colon cancer Mother 44  . Heart attack Mother   . Heart disease Sister   . Hypertension  Brother   . Heart attack Brother   . Leukemia Brother   . Obesity Daughter   . Anxiety disorder Daughter   . Hypertension Brother      Review of Systems  Constitutional: Negative for chills and fever.  HENT: Negative for congestion, sore throat and tinnitus.   Eyes: Negative for double vision, photophobia and pain.  Respiratory: Negative for cough, shortness of breath and wheezing.   Cardiovascular: Negative for chest pain, palpitations and orthopnea.  Gastrointestinal: Negative for heartburn, nausea and vomiting.  Genitourinary: Negative for dysuria, frequency and urgency.  Musculoskeletal: Positive for joint pain.  Neurological: Negative for dizziness, weakness and headaches.    Objective:  Physical Exam  Well nourished and well developed. General: Alert and oriented x3, cooperative and pleasant, no acute distress. Head: normocephalic, atraumatic, neck supple. Eyes: EOMI. Respiratory: breath sounds clear in all fields, no wheezing, rales, or rhonchi. Cardiovascular: Regular rate and rhythm, no murmurs, gallops or rubs.  Abdomen: non-tender to palpation and soft, normoactive bowel sounds. Musculoskeletal: Significantly antalgic gait pattern favoring the right side without the use of assisted devices.  Right Hip Exam: ROM: Flexion to 100, Internal Rotation 0, External Rotation 0, and Abduction 10 degrees. There is no tenderness over the greater trochanter bursa.  Right Knee Exam: No effusion. Range of motion is 0-125 degrees. No crepitus on range of motion of the knee. No medial joint line tenderness.No lateral joint line tenderness. Stable knee. Left Hip Exam: ROM: Normal without discomfort. There is no tenderness over the greater trochanter bursa.  Calves soft and nontender. Motor function intact in LE. Strength 5/5 LE bilaterally. Neuro: Distal pulses 2+. Sensation to light touch intact in LE.  Vital signs in last 24 hours: Blood pressure:  146/78 mmHg Pulse: 76 bpm     Labs:   Estimated body mass index is 24.6 kg/m as calculated from the following:   Height as of 05/17/18: 5' 3.25" (1.607 m).   Weight as of 05/17/18: 63.5 kg.   Imaging Review Plain radiographs demonstrate severe degenerative joint disease of the right hip(s). The bone quality appears to be adequate for age and reported activity level.    Preoperative templating of the joint replacement has been completed, documented, and submitted to the Operating Room personnel in order to optimize intra-operative equipment management.     Assessment/Plan:  End stage arthritis, right hip(s)  The patient history, physical examination, clinical judgement of the provider and imaging studies are consistent with end stage degenerative joint disease of the right hip(s) and total hip arthroplasty is deemed medically necessary. The treatment options including medical management, injection therapy, arthroscopy and arthroplasty were discussed at length. The risks and benefits of total hip arthroplasty were presented and reviewed. The risks due to aseptic loosening, infection, stiffness, dislocation/subluxation,  thromboembolic complications and other imponderables were discussed.  The patient acknowledged the explanation, agreed to proceed with the plan and  consent was signed. Patient is being admitted for inpatient treatment for surgery, pain control, PT, OT, prophylactic antibiotics, VTE prophylaxis, progressive ambulation and ADL's and discharge planning.The patient is planning to be discharged home with HEP.   Therapy Plans: HEP Disposition: Home with daughter Planned DVT Prophylaxis: Xarelto 10 mg daily (hx breast CA) DME needed: Gilford Rile, 3-n-1 PCP: Particia Nearing, PA-C TXA: IV Allergies: NKDA Anesthesia Concerns: None BMI: 25.8  - Patient was instructed on what medications to stop prior to surgery. - Follow-up visit in 2 weeks with Dr. Wynelle Link - Begin physical therapy following surgery -  Pre-operative lab work as pre-surgical testing - Prescriptions will be provided in hospital at time of discharge  Theresa Duty, PA-C Orthopedic Surgery [facility] [place]

## 2018-07-03 ENCOUNTER — Ambulatory Visit (INDEPENDENT_AMBULATORY_CARE_PROVIDER_SITE_OTHER): Payer: Medicare HMO | Admitting: Physician Assistant

## 2018-07-03 ENCOUNTER — Encounter: Payer: Self-pay | Admitting: Physician Assistant

## 2018-07-03 VITALS — BP 167/72 | HR 69 | Temp 97.6°F | Ht 63.25 in | Wt 144.6 lb

## 2018-07-03 DIAGNOSIS — Z0000 Encounter for general adult medical examination without abnormal findings: Secondary | ICD-10-CM | POA: Diagnosis not present

## 2018-07-03 DIAGNOSIS — R030 Elevated blood-pressure reading, without diagnosis of hypertension: Secondary | ICD-10-CM

## 2018-07-03 DIAGNOSIS — J410 Simple chronic bronchitis: Secondary | ICD-10-CM

## 2018-07-03 DIAGNOSIS — E785 Hyperlipidemia, unspecified: Secondary | ICD-10-CM | POA: Diagnosis not present

## 2018-07-03 MED ORDER — BUDESONIDE-FORMOTEROL FUMARATE 160-4.5 MCG/ACT IN AERO: 2.0000 | INHALATION_SPRAY | Freq: Two times a day (BID) | RESPIRATORY_TRACT | 12 refills | Status: DC

## 2018-07-03 MED ORDER — HYDROCHLOROTHIAZIDE 25 MG PO TABS: 25.0000 mg | ORAL_TABLET | Freq: Every day | ORAL | 3 refills | Status: DC

## 2018-07-03 MED ORDER — ATORVASTATIN CALCIUM 10 MG PO TABS: ORAL_TABLET | ORAL | 11 refills | Status: DC

## 2018-07-03 MED ORDER — TIOTROPIUM BROMIDE MONOHYDRATE 18 MCG IN CAPS: 18.0000 ug | ORAL_CAPSULE | Freq: Every day | RESPIRATORY_TRACT | 12 refills | Status: DC

## 2018-07-03 MED ORDER — PHENTERMINE HCL 37.5 MG PO CAPS: 37.5000 mg | ORAL_CAPSULE | ORAL | 2 refills | Status: DC

## 2018-07-03 MED ORDER — HYDROCOD POLST-CPM POLST ER 10-8 MG/5ML PO SUER: 5.0000 mL | Freq: Two times a day (BID) | ORAL | 0 refills | Status: DC | PRN

## 2018-07-04 LAB — CBC WITH DIFFERENTIAL/PLATELET
BASOS ABS: 0.1 10*3/uL (ref 0.0–0.2)
BASOS: 1 %
EOS (ABSOLUTE): 0.2 10*3/uL (ref 0.0–0.4)
Eos: 3 %
Hematocrit: 39.8 % (ref 34.0–46.6)
Hemoglobin: 13.3 g/dL (ref 11.1–15.9)
IMMATURE GRANULOCYTES: 0 %
Immature Grans (Abs): 0 10*3/uL (ref 0.0–0.1)
LYMPHS: 23 %
Lymphocytes Absolute: 1.8 10*3/uL (ref 0.7–3.1)
MCH: 31.4 pg (ref 26.6–33.0)
MCHC: 33.4 g/dL (ref 31.5–35.7)
MCV: 94 fL (ref 79–97)
Monocytes Absolute: 0.8 10*3/uL (ref 0.1–0.9)
Monocytes: 10 %
Neutrophils Absolute: 5 10*3/uL (ref 1.4–7.0)
Neutrophils: 63 %
PLATELETS: 256 10*3/uL (ref 150–450)
RBC: 4.24 x10E6/uL (ref 3.77–5.28)
RDW: 12.6 % (ref 12.3–15.4)
WBC: 7.9 10*3/uL (ref 3.4–10.8)

## 2018-07-04 LAB — CMP14+EGFR
ALT: 17 IU/L (ref 0–32)
AST: 15 IU/L (ref 0–40)
Albumin/Globulin Ratio: 1.6 (ref 1.2–2.2)
Albumin: 4.4 g/dL (ref 3.5–4.8)
Alkaline Phosphatase: 102 IU/L (ref 39–117)
BUN / CREAT RATIO: 16 (ref 12–28)
BUN: 22 mg/dL (ref 8–27)
Bilirubin Total: 0.3 mg/dL (ref 0.0–1.2)
CALCIUM: 9.4 mg/dL (ref 8.7–10.3)
CO2: 22 mmol/L (ref 20–29)
CREATININE: 1.41 mg/dL — AB (ref 0.57–1.00)
Chloride: 103 mmol/L (ref 96–106)
GFR calc Af Amer: 41 mL/min/{1.73_m2} — ABNORMAL LOW (ref >59)
GFR, EST NON AFRICAN AMERICAN: 36 mL/min/{1.73_m2} — AB (ref >59)
GLOBULIN, TOTAL: 2.7 g/dL (ref 1.5–4.5)
GLUCOSE: 76 mg/dL (ref 65–99)
Potassium: 4.1 mmol/L (ref 3.5–5.2)
SODIUM: 141 mmol/L (ref 134–144)
TOTAL PROTEIN: 7.1 g/dL (ref 6.0–8.5)

## 2018-07-04 LAB — LIPID PANEL
CHOL/HDL RATIO: 3.2 ratio (ref 0.0–4.4)
Cholesterol, Total: 244 mg/dL — ABNORMAL HIGH (ref 100–199)
HDL: 76 mg/dL (ref >39)
LDL CALC: 133 mg/dL — AB (ref 0–99)
TRIGLYCERIDES: 176 mg/dL — AB (ref 0–149)
VLDL Cholesterol Cal: 35 mg/dL (ref 5–40)

## 2018-07-04 LAB — TSH: TSH: 4.41 u[IU]/mL (ref 0.450–4.500)

## 2018-07-04 NOTE — Progress Notes (Signed)
BP (!) 167/72   Pulse 69   Temp 97.6 F (36.4 C) (Oral)   Ht 5' 3.25" (1.607 m)   Wt 144 lb 9.6 oz (65.6 kg)   BMI 25.41 kg/m    Subjective:    Patient ID: Angela Parrish, female    DOB: [birth year], 77 y.o.   MRN: [id number]  HPI: Angela Parrish is a 77 y.o. female presenting on 07/03/2018 for Annual Exam  This patient comes in for annual well physical examination. All medications are reviewed today. There are no reports of any problems with the medications. All of the medical conditions are reviewed and updated.  Lab work is reviewed and will be ordered as medically necessary. There are no new problems reported with today's visit.  Patient reports doing well overall.   Past Medical History:  Diagnosis Date  . Abnormal Pap smear    Previous colposcopy  . Asthma   . Breast cancer ([place]) 2000   Status post left lumpectomy  . COPD (chronic obstructive pulmonary disease) ([place])   . Hyperlipidemia   . Hypertension   . Personal history of radiation therapy 2000   Relevant past medical, surgical, family and social history reviewed and updated as indicated. Interim medical history since our last visit reviewed. Allergies and medications reviewed and updated. DATA REVIEWED: CHART IN EPIC  Family History reviewed for pertinent findings.  Review of Systems  Constitutional: Negative.  Negative for activity change, fatigue and fever.  HENT: Negative.   Eyes: Negative.   Respiratory: Negative.  Negative for cough.   Cardiovascular: Negative.  Negative for chest pain.  Gastrointestinal: Negative.  Negative for abdominal pain.  Endocrine: Negative.   Genitourinary: Negative.  Negative for dysuria.  Musculoskeletal: Negative.   Skin: Negative.   Neurological: Negative.     Allergies as of 07/03/2018   No Known Allergies     Medication List        Accurate as of 07/03/18 11:59 PM. Always use your most recent med list.          albuterol (2.5 MG/3ML) 0.083% nebulizer  solution Commonly known as:  PROVENTIL Take 3 mLs (2.5 mg total) by nebulization every 6 (six) hours as needed for wheezing or shortness of breath.   albuterol 108 (90 Base) MCG/ACT inhaler Commonly known as:  PROVENTIL HFA;VENTOLIN HFA Inhale 2 puffs into the lungs every 6 (six) hours as needed for wheezing or shortness of breath. Prefers VENTOLIN   atorvastatin 10 MG tablet Commonly known as:  LIPITOR TAKE ONE (1) TABLET EACH DAY   budesonide-formoterol 160-4.5 MCG/ACT inhaler Commonly known as:  SYMBICORT Inhale 2 puffs into the lungs 2 (two) times daily.   chlorpheniramine-HYDROcodone 10-8 MG/5ML Suer Commonly known as:  TUSSIONEX Take 5 mLs by mouth every 12 (twelve) hours as needed for cough.   hydrochlorothiazide 25 MG tablet Commonly known as:  HYDRODIURIL Take 1 tablet (25 mg total) by mouth daily.   phentermine 37.5 MG capsule Take 1 capsule (37.5 mg total) by mouth every morning.   tiotropium 18 MCG inhalation capsule Commonly known as:  SPIRIVA Place 1 capsule (18 mcg total) into inhaler and inhale daily.          Objective:    BP (!) 167/72   Pulse 69   Temp 97.6 F (36.4 C) (Oral)   Ht 5' 3.25" (1.607 m)   Wt 144 lb 9.6 oz (65.6 kg)   BMI 25.41 kg/m   No Known Allergies  Wt Readings  from Last 3 Encounters:  07/03/18 144 lb 9.6 oz (65.6 kg)  05/17/18 140 lb (63.5 kg)  04/23/18 139 lb 9.6 oz (63.3 kg)    Physical Exam  Constitutional: She is oriented to person, place, and time. She appears well-developed and well-nourished.  HENT:  Head: Normocephalic and atraumatic.  Eyes: Pupils are equal, round, and reactive to light. Conjunctivae and EOM are normal.  Neck: Normal range of motion. Neck supple.  Cardiovascular: Normal rate, regular rhythm, normal heart sounds and intact distal pulses.    Pulmonary/Chest: Effort normal and breath sounds normal. Right breast exhibits no mass, no skin change and no tenderness. Left breast exhibits no mass, no  skin change and no tenderness. No breast tenderness, discharge or bleeding. Breasts are symmetrical.  Abdominal: Soft. Bowel sounds are normal.  Genitourinary: Vagina normal and uterus normal. Rectal exam shows no fissure. No breast tenderness, discharge or bleeding. There is no tenderness or lesion on the right labia. There is no tenderness or lesion on the left labia. Uterus is not deviated, not enlarged and not tender. Cervix exhibits no motion tenderness, no discharge and no friability. Right adnexum displays no mass, no tenderness and no fullness. Left adnexum displays no mass, no tenderness and no fullness. No tenderness or bleeding in the vagina. No vaginal discharge found.  Neurological: She is alert and oriented to person, place, and time. She has normal reflexes.  Skin: Skin is warm and dry. No rash noted.  Psychiatric: She has a normal mood and affect. Her behavior is normal. Judgment and thought content normal.    Results for orders placed or performed in visit on 07/03/18  CBC with Differential/Platelet  Result Value Ref Range   WBC 7.9 3.4 - 10.8 x10E3/uL   RBC 4.24 3.77 - 5.28 x10E6/uL   Hemoglobin 13.3 11.1 - 15.9 g/dL   Hematocrit 39.8 34.0 - 46.6 %   MCV 94 79 - 97 fL   MCH 31.4 26.6 - 33.0 pg   MCHC 33.4 31.5 - 35.7 g/dL   RDW 12.6 12.3 - 15.4 %   Platelets 256 150 - 450 x10E3/uL   Neutrophils 63 Not Estab. %   Lymphs 23 Not Estab. %   Monocytes 10 Not Estab. %   Eos 3 Not Estab. %   Basos 1 Not Estab. %   Neutrophils Absolute 5.0 1.4 - 7.0 x10E3/uL   Lymphocytes Absolute 1.8 0.7 - 3.1 x10E3/uL   Monocytes Absolute 0.8 0.1 - 0.9 x10E3/uL   EOS (ABSOLUTE) 0.2 0.0 - 0.4 x10E3/uL   Basophils Absolute 0.1 0.0 - 0.2 x10E3/uL   Immature Granulocytes 0 Not Estab. %   Immature Grans (Abs) 0.0 0.0 - 0.1 x10E3/uL  CMP14+EGFR  Result Value Ref Range   Glucose 76 65 - 99 mg/dL   BUN 22 8 - 27 mg/dL   Creatinine, Ser 1.41 (H) 0.57 - 1.00 mg/dL   GFR calc non Af Amer 36 (L)  >59 mL/min/1.73   GFR calc Af Amer 41 (L) >59 mL/min/1.73   BUN/Creatinine Ratio 16 12 - 28   Sodium 141 134 - 144 mmol/L   Potassium 4.1 3.5 - 5.2 mmol/L   Chloride 103 96 - 106 mmol/L   CO2 22 20 - 29 mmol/L   Calcium 9.4 8.7 - 10.3 mg/dL   Total Protein 7.1 6.0 - 8.5 g/dL   Albumin 4.4 3.5 - 4.8 g/dL   Globulin, Total 2.7 1.5 - 4.5 g/dL   Albumin/Globulin Ratio 1.6 1.2 - 2.2  Bilirubin Total 0.3 0.0 - 1.2 mg/dL   Alkaline Phosphatase 102 39 - 117 IU/L   AST 15 0 - 40 IU/L   ALT 17 0 - 32 IU/L  Lipid panel  Result Value Ref Range   Cholesterol, Total 244 (H) 100 - 199 mg/dL   Triglycerides 176 (H) 0 - 149 mg/dL   HDL 76 >39 mg/dL   VLDL Cholesterol Cal 35 5 - 40 mg/dL   LDL Calculated 133 (H) 0 - 99 mg/dL   Chol/HDL Ratio 3.2 0.0 - 4.4 ratio  TSH  Result Value Ref Range   TSH 4.410 0.450 - 4.500 uIU/mL      Assessment & Plan:   1. Well adult exam - CBC with Differential/Platelet - CMP14+EGFR - Lipid panel - TSH  2. Hyperlipidemia - atorvastatin (LIPITOR) 10 MG tablet; TAKE ONE (1) TABLET EACH DAY  Dispense: 30 tablet; Refill: 11  3. Simple chronic bronchitis ([facility]) - budesonide-formoterol (SYMBICORT) 160-4.5 MCG/ACT inhaler; Inhale 2 puffs into the lungs 2 (two) times daily.  Dispense: 1 Inhaler; Refill: 12 - tiotropium (SPIRIVA) 18 MCG inhalation capsule; Place 1 capsule (18 mcg total) into inhaler and inhale daily.  Dispense: 30 capsule; Refill: 12  4. Elevated blood pressure reading - hydrochlorothiazide (HYDRODIURIL) 25 MG tablet; Take 1 tablet (25 mg total) by mouth daily.  Dispense: 90 tablet; Refill: 3   Continue all other maintenance medications as listed above.  Follow up plan: No follow-ups on file.  Educational handout given for [place] PA-C [facility] [address]  [place], [place] [address] [phone]   07/04/2018, 11:44 PM

## 2018-07-09 ENCOUNTER — Other Ambulatory Visit (HOSPITAL_COMMUNITY): Payer: Medicare HMO

## 2018-07-10 ENCOUNTER — Inpatient Hospital Stay (HOSPITAL_COMMUNITY): Admission: RE | Admit: 2018-07-10 | Payer: Medicare HMO | Source: Ambulatory Visit

## 2018-07-11 NOTE — Progress Notes (Signed)
MEDICAL CLEARANCE : Particia Nearing, PAC-C ON CHART   LOV CARDIOLOGY DR. MCDOWELL 10-24-17 Epic   EKG 10-24-17 Epic   ECHO 10-30-17 Epic   ECHO 11-10-17 Epic   CBCDIFF, CMP14 AND GFR 07-03-18 Epic

## 2018-07-11 NOTE — Patient Instructions (Addendum)
Angela Parrish  07/11/2018   Your procedure is scheduled on: 07-17-18   Report to [facility] Main  Entrance    Report to admitting at 9:15AM    Call this number if you have problems the morning of surgery [phone]     Remember: Do not eat food or drink liquids :After Midnight. BRUSH YOUR TEETH MORNING OF SURGERY AND RINSE YOUR MOUTH OUT, NO CHEWING GUM CANDY OR MINTS.     Take these medicines the morning of surgery with A SIP OF WATER: ALBUTEROL INHALER/NEBULIZER IF NEEDED, SYMBICORT INHALER, ATORVASTATIN                                  You may not have any metal on your body including hair pins and              piercings  Do not wear jewelry, make-up, lotions, powders or perfumes, deodorant             Do not wear nail polish.  Do not shave  48 hours prior to surgery.           Do not bring valuables to the hospital. [name]  Contacts, dentures or bridgework may not be worn into surgery.  Leave suitcase in the car. After surgery it may be brought to your room.                  Please read over the following fact sheets you were given: _____________________________________________________________________             [facility] - Preparing for Surgery Before surgery, you can play an important role.  Because skin is not sterile, your skin needs to be as free of germs as possible.  You can reduce the number of germs on your skin by washing with CHG (chlorahexidine gluconate) soap before surgery.  CHG is an antiseptic cleaner which kills germs and bonds with the skin to continue killing germs even after washing. Please DO NOT use if you have an allergy to CHG or antibacterial soaps.  If your skin becomes reddened/irritated stop using the CHG and inform your nurse when you arrive at Short Stay. Do not shave (including legs and underarms) for at least 48 hours prior to the first CHG shower.  You may  shave your face/neck. Please follow these instructions carefully:  1.  Shower with CHG Soap the night before surgery and the  morning of Surgery.  2.  If you choose to wash your hair, wash your hair first as usual with your  normal  shampoo.  3.  After you shampoo, rinse your hair and body thoroughly to remove the  shampoo.                           4.  Use CHG as you would any other liquid soap.  You can apply chg directly  to the skin and wash                       Gently with a scrungie or clean washcloth.  5.  Apply the CHG Soap to your body ONLY FROM THE NECK  DOWN.   Do not use on face/ open                           Wound or open sores. Avoid contact with eyes, ears mouth and genitals (private parts).                       Wash face,  Genitals (private parts) with your normal soap.             6.  Wash thoroughly, paying special attention to the area where your surgery  will be performed.  7.  Thoroughly rinse your body with warm water from the neck down.  8.  DO NOT shower/wash with your normal soap after using and rinsing off  the CHG Soap.                9.  Pat yourself dry with a clean towel.            10.  Wear clean pajamas.            11.  Place clean sheets on your bed the night of your first shower and do not  sleep with pets. Day of Surgery : Do not apply any lotions/deodorants the morning of surgery.  Please wear clean clothes to the hospital/surgery center.  FAILURE TO FOLLOW THESE INSTRUCTIONS MAY RESULT IN THE CANCELLATION OF YOUR SURGERY PATIENT SIGNATURE_________________________________  NURSE SIGNATURE__________________________________  ________________________________________________________________________   Angela Parrish  An incentive spirometer is a tool that can help keep your lungs clear and active. This tool measures how well you are filling your lungs with each breath. Taking long deep breaths may help reverse or decrease the chance of developing  breathing (pulmonary) problems (especially infection) following:  A long period of time when you are unable to move or be active. BEFORE THE PROCEDURE   If the spirometer includes an indicator to show your best effort, your nurse or respiratory therapist will set it to a desired goal.  If possible, sit up straight or lean slightly forward. Try not to slouch.  Hold the incentive spirometer in an upright position. INSTRUCTIONS FOR USE  1. Sit on the edge of your bed if possible, or sit up as far as you can in bed or on a chair. 2. Hold the incentive spirometer in an upright position. 3. Breathe out normally. 4. Place the mouthpiece in your mouth and seal your lips tightly around it. 5. Breathe in slowly and as deeply as possible, raising the piston or the ball toward the top of the column. 6. Hold your breath for 3-5 seconds or for as long as possible. Allow the piston or ball to fall to the bottom of the column. 7. Remove the mouthpiece from your mouth and breathe out normally. 8. Rest for a few seconds and repeat Steps 1 through 7 at least 10 times every 1-2 hours when you are awake. Take your time and take a few normal breaths between deep breaths. 9. The spirometer may include an indicator to show your best effort. Use the indicator as a goal to work toward during each repetition. 10. After each set of 10 deep breaths, practice coughing to be sure your lungs are clear. If you have an incision (the cut made at the time of surgery), support your incision when coughing by placing a pillow or rolled up towels firmly against it. Once you are able to  get out of bed, walk around indoors and cough well. You may stop using the incentive spirometer when instructed by your caregiver.  RISKS AND COMPLICATIONS  Take your time so you do not get dizzy or light-headed.  If you are in pain, you may need to take or ask for pain medication before doing incentive spirometry. It is harder to take a deep  breath if you are having pain. AFTER USE  Rest and breathe slowly and easily.  It can be helpful to keep track of a log of your progress. Your caregiver can provide you with a simple table to help with this. If you are using the spirometer at home, follow these instructions: [place] IF:   You are having difficultly using the spirometer.  You have trouble using the spirometer as often as instructed.  Your pain medication is not giving enough relief while using the spirometer.  You develop fever of 100.5 F (38.1 C) or higher. SEEK IMMEDIATE MEDICAL CARE IF:   You cough up bloody sputum that had not been present before.  You develop fever of 102 F (38.9 C) or greater.  You develop worsening pain at or near the incision site. MAKE SURE YOU:   Understand these instructions.  Will watch your condition.  Will get help right away if you are not doing well or get worse. Document Released: 12/18/2006 Document Revised: 10/30/2011 Document Reviewed: 02/18/2007 Exit[employer] Patient Information 2014 [employer], [place].   ________________________________________________________________________  WHAT IS A BLOOD TRANSFUSION? Blood Transfusion Information  A transfusion is the replacement of blood or some of its parts. Blood is made up of multiple cells which provide different functions.  Red blood cells carry oxygen and are used for blood loss replacement.  White blood cells fight against infection.  Platelets control bleeding.  Plasma helps clot blood.  Other blood products are available for specialized needs, such as hemophilia or other clotting disorders. BEFORE THE TRANSFUSION  Who gives blood for transfusions?   Healthy volunteers who are fully evaluated to make sure their blood is safe. This is blood bank blood. Transfusion therapy is the safest it has ever been in the practice of medicine. Before blood is taken from a donor, a complete history is taken to make sure  that person has no history of diseases nor engages in risky social behavior (examples are intravenous drug use or sexual activity with multiple partners). The donor's travel history is screened to minimize risk of transmitting infections, such as malaria. The donated blood is tested for signs of infectious diseases, such as HIV and hepatitis. The blood is then tested to be sure it is compatible with you in order to minimize the chance of a transfusion reaction. If you or a relative donates blood, this is often done in anticipation of surgery and is not appropriate for emergency situations. It takes many days to process the donated blood. RISKS AND COMPLICATIONS Although transfusion therapy is very safe and saves many lives, the main dangers of transfusion include:   Getting an infectious disease.  Developing a transfusion reaction. This is an allergic reaction to something in the blood you were given. Every precaution is taken to prevent this. The decision to have a blood transfusion has been considered carefully by your caregiver before blood is given. Blood is not given unless the benefits outweigh the risks. AFTER THE TRANSFUSION  Right after receiving a blood transfusion, you will usually feel much better and more energetic. This is especially true if  your red blood cells have gotten low (anemic). The transfusion raises the level of the red blood cells which carry oxygen, and this usually causes an energy increase.  The nurse administering the transfusion will monitor you carefully for complications. HOME CARE INSTRUCTIONS  No special instructions are needed after a transfusion. You may find your energy is better. Speak with your caregiver about any limitations on activity for underlying diseases you may have. SEEK MEDICAL CARE IF:   Your condition is not improving after your transfusion.  You develop redness or irritation at the intravenous (IV) site. SEEK IMMEDIATE MEDICAL CARE IF:  Any of  the following symptoms occur over the next 12 hours:  Shaking chills.  You have a temperature by mouth above 102 F (38.9 C), not controlled by medicine.  Chest, back, or muscle pain.  People around you feel you are not acting correctly or are confused.  Shortness of breath or difficulty breathing.  Dizziness and fainting.  You get a rash or develop hives.  You have a decrease in urine output.  Your urine turns a dark color or changes to pink, red, or brown. Any of the following symptoms occur over the next 10 days:  You have a temperature by mouth above 102 F (38.9 C), not controlled by medicine.  Shortness of breath.  Weakness after normal activity.  The white part of the eye turns yellow (jaundice).  You have a decrease in the amount of urine or are urinating less often.  Your urine turns a dark color or changes to pink, red, or brown. Document Released: 08/04/2000 Document Revised: 10/30/2011 Document Reviewed: 03/23/2008 [facility] Patient Information 2014 [place], [place].  _______________________________________________________________________

## 2018-07-12 ENCOUNTER — Encounter (HOSPITAL_COMMUNITY): Payer: Self-pay

## 2018-07-12 ENCOUNTER — Other Ambulatory Visit: Payer: Self-pay

## 2018-07-12 ENCOUNTER — Encounter (HOSPITAL_COMMUNITY)
Admission: RE | Admit: 2018-07-12 | Discharge: 2018-07-12 | Disposition: A | Discharge status: Home / Self Care | Payer: Medicare HMO | Source: Ambulatory Visit | Attending: Orthopedic Surgery | Admitting: Orthopedic Surgery

## 2018-07-12 DIAGNOSIS — Z01812 Encounter for preprocedural laboratory examination: Secondary | ICD-10-CM | POA: Diagnosis not present

## 2018-07-12 HISTORY — DX: Headache, unspecified: R51.9

## 2018-07-12 HISTORY — DX: Dyspnea, unspecified: R06.00

## 2018-07-12 HISTORY — DX: Unspecified osteoarthritis, unspecified site: M19.90

## 2018-07-12 HISTORY — DX: Headache: R51

## 2018-07-12 LAB — SURGICAL PCR SCREEN
MRSA, PCR: NEGATIVE
Staphylococcus aureus: NEGATIVE

## 2018-07-12 LAB — PROTIME-INR
INR: 0.92
Prothrombin Time: 12.3 seconds (ref 11.4–15.2)

## 2018-07-12 LAB — APTT: aPTT: 28 seconds (ref 24–36)

## 2018-07-13 LAB — ABO/RH: ABO/RH(D): O POS

## 2018-07-17 ENCOUNTER — Inpatient Hospital Stay (HOSPITAL_COMMUNITY): Payer: Medicare HMO | Admitting: Certified Registered"

## 2018-07-17 ENCOUNTER — Inpatient Hospital Stay (HOSPITAL_COMMUNITY): Payer: Medicare HMO

## 2018-07-17 ENCOUNTER — Encounter (HOSPITAL_COMMUNITY)
Admission: RE | Disposition: A | Discharge status: Home / Self Care | Payer: Self-pay | Source: Home / Self Care | Attending: Orthopedic Surgery

## 2018-07-17 ENCOUNTER — Other Ambulatory Visit: Payer: Self-pay

## 2018-07-17 ENCOUNTER — Inpatient Hospital Stay (HOSPITAL_COMMUNITY)
Admission: RE | Admit: 2018-07-17 | Discharge: 2018-07-18 | DRG: 470 | Disposition: A | Discharge status: Home / Self Care | Payer: Medicare HMO | Attending: Orthopedic Surgery | Admitting: Orthopedic Surgery

## 2018-07-17 ENCOUNTER — Encounter (HOSPITAL_COMMUNITY): Payer: Self-pay | Admitting: *Deleted

## 2018-07-17 DIAGNOSIS — Z87891 Personal history of nicotine dependence: Secondary | ICD-10-CM

## 2018-07-17 DIAGNOSIS — J449 Chronic obstructive pulmonary disease, unspecified: Secondary | ICD-10-CM | POA: Diagnosis not present

## 2018-07-17 DIAGNOSIS — I10 Essential (primary) hypertension: Secondary | ICD-10-CM | POA: Diagnosis not present

## 2018-07-17 DIAGNOSIS — Z800 Family history of malignant neoplasm of digestive organs: Secondary | ICD-10-CM

## 2018-07-17 DIAGNOSIS — Z806 Family history of leukemia: Secondary | ICD-10-CM

## 2018-07-17 DIAGNOSIS — Z923 Personal history of irradiation: Secondary | ICD-10-CM | POA: Diagnosis not present

## 2018-07-17 DIAGNOSIS — Z853 Personal history of malignant neoplasm of breast: Secondary | ICD-10-CM | POA: Diagnosis not present

## 2018-07-17 DIAGNOSIS — Z801 Family history of malignant neoplasm of trachea, bronchus and lung: Secondary | ICD-10-CM | POA: Diagnosis not present

## 2018-07-17 DIAGNOSIS — Z7951 Long term (current) use of inhaled steroids: Secondary | ICD-10-CM

## 2018-07-17 DIAGNOSIS — M1611 Unilateral primary osteoarthritis, right hip: Secondary | ICD-10-CM | POA: Diagnosis not present

## 2018-07-17 DIAGNOSIS — Z96641 Presence of right artificial hip joint: Secondary | ICD-10-CM | POA: Diagnosis not present

## 2018-07-17 DIAGNOSIS — Z96649 Presence of unspecified artificial hip joint: Secondary | ICD-10-CM

## 2018-07-17 DIAGNOSIS — Z419 Encounter for procedure for purposes other than remedying health state, unspecified: Secondary | ICD-10-CM

## 2018-07-17 DIAGNOSIS — Z471 Aftercare following joint replacement surgery: Secondary | ICD-10-CM | POA: Diagnosis not present

## 2018-07-17 DIAGNOSIS — M169 Osteoarthritis of hip, unspecified: Secondary | ICD-10-CM

## 2018-07-17 DIAGNOSIS — E785 Hyperlipidemia, unspecified: Secondary | ICD-10-CM | POA: Diagnosis not present

## 2018-07-17 DIAGNOSIS — Z8249 Family history of ischemic heart disease and other diseases of the circulatory system: Secondary | ICD-10-CM

## 2018-07-17 HISTORY — PX: TOTAL HIP ARTHROPLASTY: SHX124

## 2018-07-17 LAB — TYPE AND SCREEN
ABO/RH(D): O POS
ANTIBODY SCREEN: NEGATIVE

## 2018-07-17 SURGERY — ARTHROPLASTY, HIP, TOTAL, ANTERIOR APPROACH
Anesthesia: Monitor Anesthesia Care | Site: Hip | Laterality: Right

## 2018-07-17 MED ORDER — PROPOFOL 10 MG/ML IV BOLUS
INTRAVENOUS | Status: AC
  Filled 2018-07-17: qty 20

## 2018-07-17 MED ORDER — OXYCODONE HCL 5 MG PO TABS: 5.0000 mg | ORAL_TABLET | Freq: Once | ORAL | Status: DC | PRN

## 2018-07-17 MED ORDER — CEFAZOLIN SODIUM-DEXTROSE 1-4 GM/50ML-% IV SOLN
1.0000 g | Freq: Four times a day (QID) | INTRAVENOUS | Status: AC
  Administered 2018-07-17 (×2): 1 g via INTRAVENOUS
  Filled 2018-07-17 (×2): qty 50

## 2018-07-17 MED ORDER — MENTHOL 3 MG MT LOZG: 1.0000 | LOZENGE | OROMUCOSAL | Status: DC | PRN

## 2018-07-17 MED ORDER — BUPIVACAINE IN DEXTROSE 0.75-8.25 % IT SOLN
INTRATHECAL | Status: DC | PRN
  Administered 2018-07-17: 1.8 mL via INTRATHECAL

## 2018-07-17 MED ORDER — ONDANSETRON HCL 4 MG/2ML IJ SOLN: 4.0000 mg | Freq: Four times a day (QID) | INTRAMUSCULAR | Status: DC | PRN

## 2018-07-17 MED ORDER — METHOCARBAMOL 500 MG IVPB - SIMPLE MED
INTRAVENOUS | Status: AC
  Filled 2018-07-17: qty 50

## 2018-07-17 MED ORDER — MORPHINE SULFATE (PF) 2 MG/ML IV SOLN: 0.5000 mg | INTRAVENOUS | Status: DC | PRN

## 2018-07-17 MED ORDER — FENTANYL CITRATE (PF) 100 MCG/2ML IJ SOLN
25.0000 ug | INTRAMUSCULAR | Status: DC | PRN
  Administered 2018-07-17 (×2): 25 ug via INTRAVENOUS

## 2018-07-17 MED ORDER — APIXABAN 2.5 MG PO TABS: 2.5000 mg | ORAL_TABLET | Freq: Two times a day (BID) | ORAL | 0 refills | Status: DC

## 2018-07-17 MED ORDER — FENTANYL CITRATE (PF) 100 MCG/2ML IJ SOLN
INTRAMUSCULAR | Status: AC
  Filled 2018-07-17: qty 2

## 2018-07-17 MED ORDER — METOCLOPRAMIDE HCL 5 MG PO TABS: 5.0000 mg | ORAL_TABLET | Freq: Three times a day (TID) | ORAL | Status: DC | PRN

## 2018-07-17 MED ORDER — MOMETASONE FURO-FORMOTEROL FUM 200-5 MCG/ACT IN AERO
2.0000 | INHALATION_SPRAY | Freq: Two times a day (BID) | RESPIRATORY_TRACT | Status: DC
  Administered 2018-07-17 – 2018-07-18 (×2): 2 via RESPIRATORY_TRACT
  Filled 2018-07-17: qty 8.8

## 2018-07-17 MED ORDER — RIVAROXABAN 10 MG PO TABS: 10.0000 mg | ORAL_TABLET | Freq: Every day | ORAL | 0 refills | Status: DC

## 2018-07-17 MED ORDER — DEXAMETHASONE SODIUM PHOSPHATE 10 MG/ML IJ SOLN
8.0000 mg | Freq: Once | INTRAMUSCULAR | Status: AC
  Administered 2018-07-17: 10 mg via INTRAVENOUS

## 2018-07-17 MED ORDER — TRAMADOL HCL 50 MG PO TABS: 50.0000 mg | ORAL_TABLET | Freq: Four times a day (QID) | ORAL | Status: DC | PRN

## 2018-07-17 MED ORDER — 0.9 % SODIUM CHLORIDE (POUR BTL) OPTIME
TOPICAL | Status: DC | PRN
  Administered 2018-07-17: 1000 mL

## 2018-07-17 MED ORDER — BISACODYL 10 MG RE SUPP: 10.0000 mg | Freq: Every day | RECTAL | Status: DC | PRN

## 2018-07-17 MED ORDER — DIPHENHYDRAMINE HCL 12.5 MG/5ML PO ELIX: 12.5000 mg | ORAL_SOLUTION | ORAL | Status: DC | PRN

## 2018-07-17 MED ORDER — ALBUTEROL SULFATE HFA 108 (90 BASE) MCG/ACT IN AERS: 2.0000 | INHALATION_SPRAY | Freq: Four times a day (QID) | RESPIRATORY_TRACT | Status: DC | PRN

## 2018-07-17 MED ORDER — METHOCARBAMOL 500 MG PO TABS: 500.0000 mg | ORAL_TABLET | Freq: Four times a day (QID) | ORAL | 0 refills | Status: DC

## 2018-07-17 MED ORDER — ACETAMINOPHEN 500 MG PO TABS
500.0000 mg | ORAL_TABLET | Freq: Four times a day (QID) | ORAL | Status: DC
  Administered 2018-07-17 – 2018-07-18 (×3): 500 mg via ORAL
  Filled 2018-07-17 (×3): qty 1

## 2018-07-17 MED ORDER — CEFAZOLIN SODIUM-DEXTROSE 2-4 GM/100ML-% IV SOLN
2.0000 g | INTRAVENOUS | Status: AC
  Administered 2018-07-17: 2 g via INTRAVENOUS
  Filled 2018-07-17: qty 100

## 2018-07-17 MED ORDER — ONDANSETRON HCL 4 MG PO TABS: 4.0000 mg | ORAL_TABLET | Freq: Four times a day (QID) | ORAL | Status: DC | PRN

## 2018-07-17 MED ORDER — LIDOCAINE 2% (20 MG/ML) 5 ML SYRINGE
INTRAMUSCULAR | Status: AC
  Filled 2018-07-17: qty 5

## 2018-07-17 MED ORDER — BUPIVACAINE-EPINEPHRINE (PF) 0.25% -1:200000 IJ SOLN
INTRAMUSCULAR | Status: DC | PRN
  Administered 2018-07-17: 30 mL

## 2018-07-17 MED ORDER — STERILE WATER FOR IRRIGATION IR SOLN
Status: DC | PRN
  Administered 2018-07-17: 2000 mL

## 2018-07-17 MED ORDER — HYDROCODONE-ACETAMINOPHEN 5-325 MG PO TABS
1.0000 | ORAL_TABLET | ORAL | Status: DC | PRN
  Administered 2018-07-18 (×2): 2 via ORAL
  Filled 2018-07-17 (×2): qty 2

## 2018-07-17 MED ORDER — TRAMADOL HCL 50 MG PO TABS: 50.0000 mg | ORAL_TABLET | Freq: Four times a day (QID) | ORAL | 0 refills | Status: DC | PRN

## 2018-07-17 MED ORDER — FLEET ENEMA 7-19 GM/118ML RE ENEM: 1.0000 | ENEMA | Freq: Once | RECTAL | Status: DC | PRN

## 2018-07-17 MED ORDER — HYDROCODONE-ACETAMINOPHEN 5-325 MG PO TABS: 1.0000 | ORAL_TABLET | Freq: Four times a day (QID) | ORAL | 0 refills | Status: DC | PRN

## 2018-07-17 MED ORDER — TRANEXAMIC ACID-NACL 1000-0.7 MG/100ML-% IV SOLN
1000.0000 mg | INTRAVENOUS | Status: AC
  Administered 2018-07-17: 1000 mg via INTRAVENOUS
  Filled 2018-07-17: qty 100

## 2018-07-17 MED ORDER — PHENYLEPHRINE 40 MCG/ML (10ML) SYRINGE FOR IV PUSH (FOR BLOOD PRESSURE SUPPORT)
PREFILLED_SYRINGE | INTRAVENOUS | Status: DC | PRN
  Administered 2018-07-17: 80 ug via INTRAVENOUS
  Administered 2018-07-17: 40 ug via INTRAVENOUS

## 2018-07-17 MED ORDER — METOCLOPRAMIDE HCL 5 MG/ML IJ SOLN: 5.0000 mg | Freq: Three times a day (TID) | INTRAMUSCULAR | Status: DC | PRN

## 2018-07-17 MED ORDER — ACETAMINOPHEN 10 MG/ML IV SOLN
1000.0000 mg | Freq: Four times a day (QID) | INTRAVENOUS | Status: DC
  Administered 2018-07-17: 1000 mg via INTRAVENOUS
  Filled 2018-07-17: qty 100

## 2018-07-17 MED ORDER — FENTANYL CITRATE (PF) 100 MCG/2ML IJ SOLN
INTRAMUSCULAR | Status: DC | PRN
  Administered 2018-07-17 (×2): 50 ug via INTRAVENOUS

## 2018-07-17 MED ORDER — METHOCARBAMOL 500 MG PO TABS
500.0000 mg | ORAL_TABLET | Freq: Four times a day (QID) | ORAL | Status: DC | PRN
  Administered 2018-07-17: 500 mg via ORAL
  Filled 2018-07-17: qty 1

## 2018-07-17 MED ORDER — EPHEDRINE SULFATE-NACL 50-0.9 MG/10ML-% IV SOSY
PREFILLED_SYRINGE | INTRAVENOUS | Status: DC | PRN
  Administered 2018-07-17: 10 mg via INTRAVENOUS
  Administered 2018-07-17: 5 mg via INTRAVENOUS
  Administered 2018-07-17 (×2): 10 mg via INTRAVENOUS

## 2018-07-17 MED ORDER — PROPOFOL 10 MG/ML IV BOLUS
INTRAVENOUS | Status: AC
  Filled 2018-07-17: qty 40

## 2018-07-17 MED ORDER — PROPOFOL 500 MG/50ML IV EMUL
INTRAVENOUS | Status: DC | PRN
  Administered 2018-07-17: 100 ug/kg/min via INTRAVENOUS

## 2018-07-17 MED ORDER — ONDANSETRON HCL 4 MG/2ML IJ SOLN
INTRAMUSCULAR | Status: DC | PRN
  Administered 2018-07-17: 4 mg via INTRAVENOUS

## 2018-07-17 MED ORDER — LIDOCAINE 2% (20 MG/ML) 5 ML SYRINGE
INTRAMUSCULAR | Status: DC | PRN
  Administered 2018-07-17: 50 mg via INTRAVENOUS

## 2018-07-17 MED ORDER — OXYCODONE HCL 5 MG/5ML PO SOLN
5.0000 mg | Freq: Once | ORAL | Status: DC | PRN
  Filled 2018-07-17: qty 5

## 2018-07-17 MED ORDER — UMECLIDINIUM BROMIDE 62.5 MCG/INH IN AEPB
1.0000 | INHALATION_SPRAY | Freq: Every day | RESPIRATORY_TRACT | Status: DC
  Administered 2018-07-18: 1 via RESPIRATORY_TRACT
  Filled 2018-07-17: qty 7

## 2018-07-17 MED ORDER — APIXABAN 2.5 MG PO TABS
2.5000 mg | ORAL_TABLET | Freq: Two times a day (BID) | ORAL | Status: DC
  Administered 2018-07-18: 2.5 mg via ORAL
  Filled 2018-07-17: qty 1

## 2018-07-17 MED ORDER — SODIUM CHLORIDE 0.9 % IV SOLN
INTRAVENOUS | Status: DC
  Administered 2018-07-17 – 2018-07-18 (×2): via INTRAVENOUS

## 2018-07-17 MED ORDER — CHLORHEXIDINE GLUCONATE 4 % EX LIQD: 60.0000 mL | Freq: Once | CUTANEOUS | Status: DC

## 2018-07-17 MED ORDER — LACTATED RINGERS IV SOLN
INTRAVENOUS | Status: DC
  Administered 2018-07-17 (×2): via INTRAVENOUS

## 2018-07-17 MED ORDER — DOCUSATE SODIUM 100 MG PO CAPS
100.0000 mg | ORAL_CAPSULE | Freq: Two times a day (BID) | ORAL | Status: DC
  Administered 2018-07-17 – 2018-07-18 (×2): 100 mg via ORAL
  Filled 2018-07-17 (×2): qty 1

## 2018-07-17 MED ORDER — METHOCARBAMOL 500 MG IVPB - SIMPLE MED
500.0000 mg | Freq: Four times a day (QID) | INTRAVENOUS | Status: DC | PRN
  Administered 2018-07-17: 500 mg via INTRAVENOUS
  Filled 2018-07-17: qty 50

## 2018-07-17 MED ORDER — ALBUTEROL SULFATE (2.5 MG/3ML) 0.083% IN NEBU: 2.5000 mg | INHALATION_SOLUTION | Freq: Four times a day (QID) | RESPIRATORY_TRACT | Status: DC | PRN

## 2018-07-17 MED ORDER — DEXAMETHASONE SODIUM PHOSPHATE 10 MG/ML IJ SOLN
10.0000 mg | Freq: Once | INTRAMUSCULAR | Status: AC
  Administered 2018-07-18: 10 mg via INTRAVENOUS
  Filled 2018-07-17: qty 1

## 2018-07-17 MED ORDER — PHENOL 1.4 % MT LIQD
1.0000 | OROMUCOSAL | Status: DC | PRN
  Filled 2018-07-17: qty 177

## 2018-07-17 MED ORDER — BUPIVACAINE-EPINEPHRINE (PF) 0.25% -1:200000 IJ SOLN
INTRAMUSCULAR | Status: AC
  Filled 2018-07-17: qty 30

## 2018-07-17 MED ORDER — TIOTROPIUM BROMIDE MONOHYDRATE 18 MCG IN CAPS: 18.0000 ug | ORAL_CAPSULE | Freq: Every day | RESPIRATORY_TRACT | Status: DC

## 2018-07-17 MED ORDER — POLYETHYLENE GLYCOL 3350 17 G PO PACK: 17.0000 g | PACK | Freq: Every day | ORAL | Status: DC | PRN

## 2018-07-17 SURGICAL SUPPLY — 41 items
BAG DECANTER FOR FLEXI CONT (MISCELLANEOUS) IMPLANT
BAG ZIPLOCK 12X15 (MISCELLANEOUS) IMPLANT
BLADE SAG 18X100X1.27 (BLADE) ×3 IMPLANT
CLOSURE WOUND 1/2 X4 (GAUZE/BANDAGES/DRESSINGS) ×2
COVER PERINEAL POST (MISCELLANEOUS) ×3 IMPLANT
COVER SURGICAL LIGHT HANDLE (MISCELLANEOUS) ×3 IMPLANT
COVER WAND RF STERILE (DRAPES) ×3 IMPLANT
CUP ACET PINNACLE SECTR 50MM (Hips) ×1 IMPLANT
DECANTER SPIKE VIAL GLASS SM (MISCELLANEOUS) ×3 IMPLANT
DRAPE STERI IOBAN 125X83 (DRAPES) ×3 IMPLANT
DRAPE U-SHAPE 47X51 STRL (DRAPES) ×6 IMPLANT
DRSG ADAPTIC 3X8 NADH LF (GAUZE/BANDAGES/DRESSINGS) ×3 IMPLANT
DRSG MEPILEX BORDER 4X4 (GAUZE/BANDAGES/DRESSINGS) ×3 IMPLANT
DRSG MEPILEX BORDER 4X8 (GAUZE/BANDAGES/DRESSINGS) ×6 IMPLANT
DURAPREP 26ML APPLICATOR (WOUND CARE) ×3 IMPLANT
ELECT REM PT RETURN 15FT ADLT (MISCELLANEOUS) ×3 IMPLANT
EVACUATOR 1/8 PVC DRAIN (DRAIN) ×3 IMPLANT
GLOVE BIO SURGEON STRL SZ7 (GLOVE) ×3 IMPLANT
GLOVE BIO SURGEON STRL SZ8 (GLOVE) ×3 IMPLANT
GLOVE BIOGEL PI IND STRL 7.0 (GLOVE) ×1 IMPLANT
GLOVE BIOGEL PI IND STRL 8 (GLOVE) ×1 IMPLANT
GLOVE BIOGEL PI INDICATOR 7.0 (GLOVE) ×2
GLOVE BIOGEL PI INDICATOR 8 (GLOVE) ×2
GOWN STRL REUS W/TWL LRG LVL3 (GOWN DISPOSABLE) ×3 IMPLANT
GOWN STRL REUS W/TWL XL LVL3 (GOWN DISPOSABLE) ×3 IMPLANT
HEAD FEM STD 32X+1 STRL (Hips) ×3 IMPLANT
HOLDER FOLEY CATH W/STRAP (MISCELLANEOUS) ×3 IMPLANT
LINER MARATHON 32 50 (Hips) ×3 IMPLANT
MANIFOLD NEPTUNE II (INSTRUMENTS) ×3 IMPLANT
PACK ANTERIOR HIP CUSTOM (KITS) ×3 IMPLANT
PINNACLE SECTOR CUP 50MM (Hips) ×3 IMPLANT
STEM FEMORAL SZ5 HIGH ACTIS (Nail) ×3 IMPLANT
STRIP CLOSURE SKIN 1/2X4 (GAUZE/BANDAGES/DRESSINGS) ×4 IMPLANT
SUT ETHIBOND NAB CT1 #1 30IN (SUTURE) ×3 IMPLANT
SUT MNCRL AB 4-0 PS2 18 (SUTURE) ×3 IMPLANT
SUT STRATAFIX 0 PDS 27 VIOLET (SUTURE) ×3
SUT VIC AB 2-0 CT1 27 (SUTURE) ×4
SUT VIC AB 2-0 CT1 TAPERPNT 27 (SUTURE) ×2 IMPLANT
SUTURE STRATFX 0 PDS 27 VIOLET (SUTURE) ×1 IMPLANT
TRAY FOLEY MTR SLVR 16FR STAT (SET/KITS/TRAYS/PACK) ×3 IMPLANT
YANKAUER SUCT BULB TIP 10FT TU (MISCELLANEOUS) ×3 IMPLANT

## 2018-07-17 NOTE — Discharge Instructions (Signed)
°Dr. Frank Aluisio °Total Joint Specialist °[facility] °[address] °[place], [place] [address] °[phone] ° °ANTERIOR APPROACH TOTAL HIP REPLACEMENT POSTOPERATIVE DIRECTIONS ° ° °Hip Rehabilitation, Guidelines Following Surgery  °The results of a hip operation are greatly improved after range of motion and muscle strengthening exercises. Follow all safety measures which are given to protect your hip. If any of these exercises cause increased pain or swelling in your joint, decrease the amount until you are comfortable again. Then slowly increase the exercises. Call your caregiver if you have problems or questions.  ° °HOME CARE INSTRUCTIONS  °• Remove items at home which could result in a fall. This includes throw rugs or furniture in walking pathways.  °· ICE to the affected hip every three hours for 30 minutes at a time and then as needed for pain and swelling.  Continue to use ice on the hip for pain and swelling from surgery. You may notice swelling that will progress down to the foot and ankle.  This is normal after surgery.  Elevate the leg when you are not up walking on it.   °· Continue to use the breathing machine which will help keep your temperature down.  It is common for your temperature to cycle up and down following surgery, especially at night when you are not up moving around and exerting yourself.  The breathing machine keeps your lungs expanded and your temperature down. ° °DIET °You may resume your previous home diet once your are discharged from the hospital. ° °DRESSING / WOUND CARE / SHOWERING °You may shower 3 days after surgery, but keep the wounds dry during showering.  You may use an occlusive plastic wrap (Press'n Seal for example), NO SOAKING/SUBMERGING IN THE BATHTUB.  If the bandage gets wet, change with a clean dry gauze.  If the incision gets wet, pat the wound dry with a clean towel. °You may start showering once you are discharged home but do not submerge the  incision under water. Just pat the incision dry and apply a dry gauze dressing on daily. °Change the surgical dressing daily and reapply a dry dressing each time. ° °ACTIVITY °Walk with your walker as instructed. °Use walker as long as suggested by your caregivers. °Avoid periods of inactivity such as sitting longer than an hour when not asleep. This helps prevent blood clots.  °You may resume a sexual relationship in one month or when given the OK by your doctor.  °You may return to work once you are cleared by your doctor.  °Do not drive a car for 6 weeks or until released by you surgeon.  °Do not drive while taking narcotics. ° °WEIGHT BEARING °Weight bearing as tolerated with assist device (walker, cane, etc) as directed, use it as long as suggested by your surgeon or therapist, typically at least 4-6 weeks. ° °POSTOPERATIVE CONSTIPATION PROTOCOL °Constipation - defined medically as fewer than three stools per week and severe constipation as less than one stool per week. ° °One of the most common issues patients have following surgery is constipation.  Even if you have a regular bowel pattern at home, your normal regimen is likely to be disrupted due to multiple reasons following surgery.  Combination of anesthesia, postoperative narcotics, change in appetite and fluid intake all can affect your bowels.  In order to avoid complications following surgery, here are some recommendations in order to help you during your recovery period. ° °Colace (docusate) - Pick up an over-the-counter form   of Colace or another stool softener and take twice a day as long as you are requiring postoperative pain medications.  Take with a full glass of water daily.  If you experience loose stools or diarrhea, hold the colace until you stool forms back up.  If your symptoms do not get better within 1 week or if they get worse, check with your doctor. ° °Dulcolax (bisacodyl) - Pick up over-the-counter and take as directed by the product  packaging as needed to assist with the movement of your bowels.  Take with a full glass of water.  Use this product as needed if not relieved by Colace only.  ° °MiraLax (polyethylene glycol) - Pick up over-the-counter to have on hand.  MiraLax is a solution that will increase the amount of water in your bowels to assist with bowel movements.  Take as directed and can mix with a glass of water, juice, soda, coffee, or tea.  Take if you go more than two days without a movement. °Do not use MiraLax more than once per day. Call your doctor if you are still constipated or irregular after using this medication for 7 days in a row. ° °If you continue to have problems with postoperative constipation, please contact the office for further assistance and recommendations.  If you experience "the worst abdominal pain ever" or develop nausea or vomiting, please contact the office immediatly for further recommendations for treatment. ° °ITCHING ° If you experience itching with your medications, try taking only a single pain pill, or even half a pain pill at a time.  You can also use Benadryl over the counter for itching or also to help with sleep.  ° °TED HOSE STOCKINGS °Wear the elastic stockings on both legs for three weeks following surgery during the day but you may remove then at night for sleeping. ° °MEDICATIONS °See your medication summary on the “After Visit Summary” that the nursing staff will review with you prior to discharge.  You may have some home medications which will be placed on hold until you complete the course of blood thinner medication.  It is important for you to complete the blood thinner medication as prescribed by your surgeon.  Continue your approved medications as instructed at time of discharge. ° °PRECAUTIONS °If you experience chest pain or shortness of breath - call 911 immediately for transfer to the hospital emergency department.  °If you develop a fever greater that 101 F, purulent drainage  from wound, increased redness or drainage from wound, foul odor from the wound/dressing, or calf pain - CONTACT YOUR SURGEON.   °                                                °FOLLOW-UP APPOINTMENTS °Make sure you keep all of your appointments after your operation with your surgeon and caregivers. You should call the office at the above phone number and make an appointment for approximately two weeks after the date of your surgery or on the date instructed by your surgeon outlined in the "After Visit Summary". ° °RANGE OF MOTION AND STRENGTHENING EXERCISES  °These exercises are designed to help you keep full movement of your hip joint. Follow your caregiver's or physical therapist's instructions. Perform all exercises about fifteen times, three times per day or as directed. Exercise both hips, even if you have   had only one joint replacement. These exercises can be done on a training (exercise) mat, on the floor, on a table or on a bed. Use whatever works the best and is most comfortable for you. Use music or television while you are exercising so that the exercises are a pleasant break in your day. This will make your life better with the exercises acting as a break in routine you can look forward to.   Lying on your back, slowly slide your foot toward your buttocks, raising your knee up off the floor. Then slowly slide your foot back down until your leg is straight again.   Lying on your back spread your legs as far apart as you can without causing discomfort.   Lying on your side, raise your upper leg and foot straight up from the floor as far as is comfortable. Slowly lower the leg and repeat.   Lying on your back, tighten up the muscle in the front of your thigh (quadriceps muscles). You can do this by keeping your leg straight and trying to raise your heel off the floor. This helps strengthen the largest muscle supporting your knee.   Lying on your back, tighten up the muscles of your buttocks both  with the legs straight and with the knee bent at a comfortable angle while keeping your heel on the floor.   IF YOU ARE TRANSFERRED TO A SKILLED REHAB FACILITY If the patient is transferred to a skilled rehab facility following release from the hospital, a list of the current medications will be sent to the facility for the patient to continue.  When discharged from the skilled rehab facility, please have the facility set up the patient's [place] prior to being released. Also, the skilled facility will be responsible for providing the patient with their medications at time of release from the facility to include their pain medication, the muscle relaxants, and their blood thinner medication. If the patient is still at the rehab facility at time of the two week follow up appointment, the skilled rehab facility will also need to assist the patient in arranging follow up appointment in our office and any transportation needs.  MAKE SURE YOU:   Understand these instructions.   Get help right away if you are not doing well or get worse.    Pick up stool softner and laxative for home use following surgery while on pain medications. Do not submerge incision under water. Please use good hand washing techniques while changing dressing each day. May shower starting three days after surgery. Please use a clean towel to pat the incision dry following showers. Continue to use ice for pain and swelling after surgery. Do not use any lotions or creams on the incision until instructed by your surgeon.   Information on my medicine - ELIQUIS (apixaban)  Why was Eliquis prescribed for you? Eliquis was prescribed for you to reduce the risk of blood clots forming after orthopedic surgery.    What do You need to know about Eliquis? Take your Eliquis TWICE DAILY - one tablet in the morning and one tablet in the evening with or without food.  It would be best to take the dose about the  same time each day.  If you have difficulty swallowing the tablet whole please discuss with your pharmacist how to take the medication safely.  Take Eliquis exactly as prescribed by your doctor and DO NOT stop taking Eliquis without talking to the doctor who  prescribed the medication.  Stopping without other medication to take the place of Eliquis may increase your risk of developing a clot.  After discharge, you should have regular check-up appointments with your healthcare provider that is prescribing your Eliquis.  What do you do if you miss a dose? If a dose of ELIQUIS is not taken at the scheduled time, take it as soon as possible on the same day and twice-daily administration should be resumed.  The dose should not be doubled to make up for a missed dose.  Do not take more than one tablet of ELIQUIS at the same time.  Important Safety Information A possible side effect of Eliquis is bleeding. You should call your healthcare provider right away if you experience any of the following: ? Bleeding from an injury or your nose that does not stop. ? Unusual colored urine (red or dark brown) or unusual colored stools (red or black). ? Unusual bruising for unknown reasons. ? A serious fall or if you hit your head (even if there is no bleeding).  Some medicines may interact with Eliquis and might increase your risk of bleeding or clotting while on Eliquis. To help avoid this, consult your healthcare provider or pharmacist prior to using any new prescription or non-prescription medications, including herbals, vitamins, non-steroidal anti-inflammatory drugs (NSAIDs) and supplements.  This website has more information on Eliquis (apixaban): [email or web address]://[email or web address].com/eliquis/home

## 2018-07-17 NOTE — Op Note (Signed)
OPERATIVE REPORT- TOTAL HIP ARTHROPLASTY   PREOPERATIVE DIAGNOSIS: Osteoarthritis of the Right hip.   POSTOPERATIVE DIAGNOSIS: Osteoarthritis of the Right  hip.   PROCEDURE: Right total hip arthroplasty, anterior approach.   SURGEON: Gaynelle Arabian, MD   ASSISTANT: Theresa Duty, PA-C  ANESTHESIA:  Spinal  ESTIMATED BLOOD LOSS:-300 mL    DRAINS: Hemovac x1.   COMPLICATIONS: None   CONDITION: PACU - hemodynamically stable.   BRIEF CLINICAL NOTE: Angela Parrish is a 77 y.o. female who has advanced end-  stage arthritis of their Right  hip with progressively worsening pain and  dysfunction.The patient has failed nonoperative management and presents for  total hip arthroplasty.   PROCEDURE IN DETAIL: After successful administration of spinal  anesthetic, the traction boots for the [facility] bed were placed on both  feet and the patient was placed onto the [facility] bed, boots placed into the leg  holders. The Right hip was then isolated from the perineum with plastic  drapes and prepped and draped in the usual sterile fashion. ASIS and  greater trochanter were marked and a oblique incision was made, starting  at about 1 cm lateral and 2 cm distal to the ASIS and coursing towards  the anterior cortex of the femur. The skin was cut with a 10 blade  through subcutaneous tissue to the level of the fascia overlying the  tensor fascia lata muscle. The fascia was then incised in line with the  incision at the junction of the anterior third and posterior 2/3rd. The  muscle was teased off the fascia and then the interval between the TFL  and the rectus was developed. The Hohmann retractor was then placed at  the top of the femoral neck over the capsule. The vessels overlying the  capsule were cauterized and the fat on top of the capsule was removed.  A Hohmann retractor was then placed anterior underneath the rectus  femoris to give exposure to the entire anterior capsule. A T-shaped   capsulotomy was performed. The edges were tagged and the femoral head  was identified.       Osteophytes are removed off the superior acetabulum.  The femoral neck was then cut in situ with an oscillating saw. Traction  was then applied to the left lower extremity utilizing the [facility]  traction. The femoral head was then removed. Retractors were placed  around the acetabulum and then circumferential removal of the labrum was  performed. Osteophytes were also removed. Reaming starts at 47 mm to  medialize and  Increased in 2 mm increments to 494 mm. We reamed in  approximately 40 degrees of abduction, 20 degrees anteversion. A 50 mm  pinnacle acetabular shell was then impacted in anatomic position under  fluoroscopic guidance with excellent purchase. We did not need to place  any additional dome screws. A 32 mm neutral + 4 marathon liner was then  placed into the acetabular shell.       The femoral lift was then placed along the lateral aspect of the femur  just distal to the vastus ridge. The leg was  externally rotated and capsule  was stripped off the inferior aspect of the femoral neck down to the  level of the lesser trochanter, this was done with electrocautery. The femur was lifted after this was performed. The  leg was then placed in an extended and adducted position essentially delivering the femur. We also removed the capsule superiorly and the piriformis from the piriformis  fossa to gain excellent exposure of the  proximal femur. Rongeur was used to remove some cancellous bone to get  into the lateral portion of the proximal femur for placement of the  initial starter reamer. The starter broaches was placed  the starter broach  and was shown to go down the center of the canal. Broaching  with the Actis system was then performed starting at size 0  coursing  Up to size 5. A size 5 had excellent torsional and rotational  and axial stability. The trial high offset neck was then placed   with a 32 + 1 trial head. The hip was then reduced. We confirmed that  the stem was in the canal both on AP and lateral x-rays. It also has excellent sizing. The hip was reduced with outstanding stability through full extension and full external rotation.. AP pelvis was taken and the leg lengths were measured and found to be equal. Hip was then dislocated again and the femoral head and neck removed. The  femoral broach was removed. Size 5 Actis stem with a high offset  neck was then impacted into the femur following native anteversion. Has  excellent purchase in the canal. Excellent torsional and rotational and  axial stability. It is confirmed to be in the canal on AP and lateral  fluoroscopic views. The 32 + 1 metal head was placed and the hip  reduced with outstanding stability. Again AP pelvis was taken and it  confirmed that the leg lengths were equal. The wound was then copiously  irrigated with saline solution and the capsule reattached and repaired  with Ethibond suture. 30 ml of .25% Bupivicaine was  injected into the capsule and into the edge of the tensor fascia lata as well as subcutaneous tissue. The fascia overlying the tensor fascia lata was then closed with a running #1 V-Loc. Subcu was closed with interrupted 2-0 Vicryl and subcuticular running 4-0 Monocryl. Incision was cleaned  and dried. Steri-Strips and a bulky sterile dressing applied. Hemovac  drain was hooked to suction and then the patient was awakened and transported to  recovery in stable condition.        Please note that a surgical assistant was a medical necessity for this procedure to perform it in a safe and expeditious manner. Assistant was necessary to provide appropriate retraction of vital neurovascular structures and to prevent femoral fracture and allow for anatomic placement of the prosthesis.  Gaynelle Arabian, M.D.

## 2018-07-17 NOTE — Anesthesia Procedure Notes (Signed)
Spinal  Patient location during procedure: OR End time: 07/17/2018 11:31 AM Staffing Resident/CRNA: Lind Covert, CRNA Performed: resident/CRNA  Preanesthetic Checklist Completed: patient identified, site marked, surgical consent, pre-op evaluation, timeout performed, IV checked, risks and benefits discussed and monitors and equipment checked Spinal Block Patient position: sitting Prep: Betadine Patient monitoring: heart rate, continuous pulse ox and blood pressure Approach: midline Location: L3-4 Injection technique: single-shot Needle Needle type: Sprotte  Needle gauge: 24 G Needle length: 9 cm Assessment Sensory level: T6 Additional Notes Expiration date of kit checked and confirmed. Patient tolerated procedure well, without complications.

## 2018-07-17 NOTE — Transfer of Care (Signed)
Immediate Anesthesia Transfer of Care Note  Patient: Angela Parrish  Procedure(s) Performed: RIGHT TOTAL HIP ARTHROPLASTY ANTERIOR APPROACH (Right Hip)  Patient Location: PACU  Anesthesia Type:Spinal  Level of Consciousness: awake, alert  and oriented  Airway & Oxygen Therapy: Patient Spontanous Breathing and Patient connected to face mask oxygen  Post-op Assessment: Report given to RN and Post -op Vital signs reviewed and stable  Post vital signs: Reviewed and stable  Last Vitals:  Vitals Value Taken Time  BP    Temp    Pulse 133 07/17/2018  1:18 PM  Resp    SpO2 84 % 07/17/2018  1:18 PM  Vitals shown include unvalidated device data.  Last Pain:  Vitals:   07/17/18 0907  TempSrc: Oral  PainSc: 0-No pain      Patients Stated Pain Goal: 3 (75/19/82 4299)  Complications: No apparent anesthesia complications

## 2018-07-17 NOTE — Anesthesia Preprocedure Evaluation (Addendum)
Anesthesia Evaluation  Patient identified by MRN, date of birth, ID band Patient awake    Reviewed: Allergy & Precautions, H&P , NPO status , Patient's Chart, lab work & pertinent test results  Airway Mallampati: II   Neck ROM: full    Dental   Pulmonary shortness of breath, asthma , COPD, former smoker,    breath sounds clear to auscultation       Cardiovascular hypertension,  Rhythm:regular Rate:Normal     Neuro/Psych  Headaches,    GI/Hepatic   Endo/Other    Renal/GU      Musculoskeletal  (+) Arthritis ,   Abdominal   Peds  Hematology   Anesthesia Other Findings   Reproductive/Obstetrics H/o breast CA                            Anesthesia Physical Anesthesia Plan  ASA: II  Anesthesia Plan: Spinal and MAC   Post-op Pain Management:    Induction: Intravenous  PONV Risk Score and Plan: 2 and Ondansetron, Dexamethasone, Treatment may vary due to age or medical condition and Propofol infusion  Airway Management Planned: Simple Face Mask  Additional Equipment:   Intra-op Plan:   Post-operative Plan:   Informed Consent: I have reviewed the patients History and Physical, chart, labs and discussed the procedure including the risks, benefits and alternatives for the proposed anesthesia with the patient or authorized representative who has indicated his/her understanding and acceptance.     Plan Discussed with: CRNA, Anesthesiologist and Surgeon  Anesthesia Plan Comments:         Anesthesia Quick Evaluation

## 2018-07-17 NOTE — Interval H&P Note (Signed)
History and Physical Interval Note:  07/17/2018 10:32 AM  Angela Parrish  has presented today for surgery, with the diagnosis of right hip osteoarthritis  The various methods of treatment have been discussed with the patient and family. After consideration of risks, benefits and other options for treatment, the patient has consented to  Procedure(s) with comments: [place] (Right) - 146min as a surgical intervention .  The patient's history has been reviewed, patient examined, no change in status, stable for surgery.  I have reviewed the patient's chart and labs.  Questions were answered to the patient's satisfaction.     Pilar Plate Demitris Pokorny

## 2018-07-17 NOTE — Evaluation (Signed)
Physical Therapy Evaluation Patient Details Name: Angela Parrish MRN: [id number] DOB: Jun 02, 1941 Today's Date: 07/17/2018   History of Present Illness  Pt s/p R THR and with hx of COPD and breast CA  Clinical Impression  Pt s/p R THR and presents with decreased R LE strength/ROM and post op pain limiting functional mobility.  Pt should progress to dc home with family assist.    Follow Up Recommendations Follow surgeon's recommendation for DC plan and follow-up therapies    Equipment Recommendations  Rolling walker with 5" wheels;3in1 (PT)    Recommendations for Other Services       Precautions / Restrictions Precautions Precautions: Fall Restrictions Weight Bearing Restrictions: No RLE Weight Bearing: Weight bearing as tolerated      Mobility  Bed Mobility Overal bed mobility: Needs Assistance Bed Mobility: Supine to Sit     Supine to sit: Min assist;Mod assist     General bed mobility comments: cues for sequence and use of L LE to self assist  Transfers Overall transfer level: Needs assistance Equipment used: Rolling walker (2 wheeled) Transfers: Sit to/from Stand Sit to Stand: Min assist         General transfer comment: cues for LE management and use of UEs to self assist  Ambulation/Gait Ambulation/Gait assistance: Min assist Gait Distance (Feet): 70 Feet Assistive device: Rolling walker (2 wheeled) Gait Pattern/deviations: Step-to pattern;Step-through pattern;Decreased step length - right;Decreased step length - left;Shuffle;Trunk flexed Gait velocity: decr   General Gait Details: cues for posture, position from RW, BOS and initial sequence  Stairs            Wheelchair Mobility    Modified Rankin (Stroke Patients Only)       Balance Overall balance assessment: Mild deficits observed, not formally tested                                           Pertinent Vitals/Pain Pain Assessment: 0-10 Pain Score: 2  Pain  Location: R hip Pain Descriptors / Indicators: Aching;Sore Pain Intervention(s): Monitored during session;Limited activity within patient's tolerance;Premedicated before session;Ice applied    Home Living Family/patient expects to be discharged to:: Private residence Living Arrangements: Non-relatives/Friends Available Help at Discharge: Family;Friend(s) Type of Home: House Home Access: Stairs to enter Entrance Stairs-Rails: Left Entrance Stairs-Number of Steps: 3 Home Layout: Able to live on main level with bedroom/bathroom Home Equipment: Cane - single point      Prior Function Level of Independence: Independent;Independent with assistive device(s)               Hand Dominance        Extremity/Trunk Assessment   Upper Extremity Assessment Upper Extremity Assessment: Overall WFL for tasks assessed    Lower Extremity Assessment Lower Extremity Assessment: RLE deficits/detail       Communication   Communication: No difficulties  Cognition Arousal/Alertness: Awake/alert Behavior During Therapy: WFL for tasks assessed/performed Overall Cognitive Status: Within Functional Limits for tasks assessed                                        General Comments      Exercises Total Joint Exercises Ankle Circles/Pumps: AROM;Both;15 reps;Supine   Assessment/Plan    PT Assessment Patient needs continued PT services  PT Problem List Decreased strength;Decreased  range of motion;Decreased activity tolerance;Decreased balance;Decreased mobility;Decreased knowledge of use of DME;Pain       PT Treatment Interventions DME instruction;Gait training;Stair training;Functional mobility training;Therapeutic activities;Therapeutic exercise;Patient/family education    PT Goals (Current goals can be found in the Care Plan section)  Acute Rehab PT Goals Patient Stated Goal: Regain IND PT Goal Formulation: With patient Time For Goal Achievement: 07/24/18 Potential  to Achieve Goals: Good    Frequency 7X/week   Barriers to discharge        Co-evaluation               AM-PAC PT "6 Clicks" Mobility  Outcome Measure Help needed turning from your back to your side while in a flat bed without using bedrails?: A Lot Help needed moving from lying on your back to sitting on the side of a flat bed without using bedrails?: A Lot Help needed moving to and from a bed to a chair (including a wheelchair)?: A Lot Help needed standing up from a chair using your arms (e.g., wheelchair or bedside chair)?: A Little Help needed to walk in hospital room?: A Little Help needed climbing 3-5 steps with a railing? : A Lot 6 Click Score: 14    End of Session Equipment Utilized During Treatment: Gait belt Activity Tolerance: Patient tolerated treatment well Patient left: in chair;with call bell/phone within reach;with family/visitor present Nurse Communication: Mobility status PT Visit Diagnosis: Difficulty in walking, not elsewhere classified (R26.2)    Time: 7014-1030 PT Time Calculation (min) (ACUTE ONLY): 23 min   Charges:   PT Evaluation $PT Eval Low Complexity: 1 Low PT Treatments $Gait Training: 8-22 mins        Debe Coder PT Acute Rehabilitation Services Pager [phone] Office [phone]   Earle Burson 07/17/2018, 5:11 PM

## 2018-07-18 LAB — CBC
HCT: 35.4 % — ABNORMAL LOW (ref 36.0–46.0)
HEMOGLOBIN: 11.6 g/dL — AB (ref 12.0–15.0)
MCH: 32 pg (ref 26.0–34.0)
MCHC: 32.8 g/dL (ref 30.0–36.0)
MCV: 97.5 fL (ref 80.0–100.0)
NRBC: 0 % (ref 0.0–0.2)
Platelets: 199 10*3/uL (ref 150–400)
RBC: 3.63 MIL/uL — AB (ref 3.87–5.11)
RDW: 12.2 % (ref 11.5–15.5)
WBC: 11.3 10*3/uL — ABNORMAL HIGH (ref 4.0–10.5)

## 2018-07-18 LAB — BASIC METABOLIC PANEL
ANION GAP: 7 (ref 5–15)
BUN: 23 mg/dL (ref 8–23)
CHLORIDE: 109 mmol/L (ref 98–111)
CO2: 23 mmol/L (ref 22–32)
Calcium: 8.6 mg/dL — ABNORMAL LOW (ref 8.9–10.3)
Creatinine, Ser: 1.28 mg/dL — ABNORMAL HIGH (ref 0.44–1.00)
GFR calc Af Amer: 46 mL/min — ABNORMAL LOW (ref >60)
GFR calc non Af Amer: 40 mL/min — ABNORMAL LOW (ref >60)
GLUCOSE: 137 mg/dL — AB (ref 70–99)
POTASSIUM: 4.1 mmol/L (ref 3.5–5.1)
SODIUM: 139 mmol/L (ref 135–145)

## 2018-07-18 NOTE — Progress Notes (Signed)
Physical Therapy Treatment Patient Details Name: Angela Parrish MRN: [id number] DOB: 1941-02-07 Today's Date: 07/18/2018    History of Present Illness Pt s/p R THR and with hx of COPD and breast CA    PT Comments    Pt progressing well with mobility and eager for dc home.  Dtr present to review car transfers, stairs and home therex with progression and written instruction provided.   Follow Up Recommendations  Follow surgeon's recommendation for DC plan and follow-up therapies     Equipment Recommendations  Rolling walker with 5" wheels;3in1 (PT)    Recommendations for Other Services       Precautions / Restrictions Precautions Precautions: Fall Restrictions Weight Bearing Restrictions: No RLE Weight Bearing: Weight bearing as tolerated    Mobility  Bed Mobility Overal bed mobility: Needs Assistance Bed Mobility: Supine to Sit;Sit to Supine     Supine to sit: Min assist Sit to supine: Min assist   General bed mobility comments: cues for sequence and use of L LE to self assist  Transfers Overall transfer level: Needs assistance Equipment used: Rolling walker (2 wheeled) Transfers: Sit to/from Stand Sit to Stand: Min guard         General transfer comment: cues for LE management and use of UEs to self assist  Ambulation/Gait Ambulation/Gait assistance: Min guard;Supervision Gait Distance (Feet): 100 Feet Assistive device: Rolling walker (2 wheeled) Gait Pattern/deviations: Step-to pattern;Step-through pattern;Decreased step length - right;Decreased step length - left;Shuffle;Trunk flexed Gait velocity: decr   General Gait Details: cues for posture, position from RW, BOS and initial sequence   Stairs Stairs: Yes Stairs assistance: Min assist Stair Management: One rail Left;Step to pattern;Forwards;With cane Number of Stairs: 6 General stair comments: 5 stairs with crutch and cane and single step bkwd with RW.  Daughter assisting and written instruction  provided.   Wheelchair Mobility    Modified Rankin (Stroke Patients Only)       Balance Overall balance assessment: Mild deficits observed, not formally tested                                          Cognition Arousal/Alertness: Awake/alert Behavior During Therapy: WFL for tasks assessed/performed Overall Cognitive Status: Within Functional Limits for tasks assessed                                        Exercises Total Joint Exercises Ankle Circles/Pumps: AROM;Both;15 reps;Supine Quad Sets: AROM;Both;10 reps;Supine Heel Slides: AAROM;Right;20 reps;Supine Hip ABduction/ADduction: AAROM;Right;15 reps;Supine Long Arc Quad: AROM;Right;10 reps;Seated    General Comments        Pertinent Vitals/Pain Pain Assessment: 0-10 Pain Score: 3  Pain Location: R hip Pain Descriptors / Indicators: Aching;Sore Pain Intervention(s): Limited activity within patient's tolerance;Monitored during session;Ice applied    Home Living                      Prior Function            PT Goals (current goals can now be found in the care plan section) Acute Rehab PT Goals Patient Stated Goal: Regain IND PT Goal Formulation: With patient Time For Goal Achievement: 07/24/18 Potential to Achieve Goals: Good Progress towards PT goals: Progressing toward goals    Frequency    7X/week  PT Plan Current plan remains appropriate    Co-evaluation              AM-PAC PT "6 Clicks" Mobility   Outcome Measure  Help needed turning from your back to your side while in a flat bed without using bedrails?: A Little Help needed moving from lying on your back to sitting on the side of a flat bed without using bedrails?: A Little Help needed moving to and from a bed to a chair (including a wheelchair)?: A Little Help needed standing up from a chair using your arms (e.g., wheelchair or bedside chair)?: A Little Help needed to walk in hospital  room?: A Little Help needed climbing 3-5 steps with a railing? : A Little 6 Click Score: 18    End of Session Equipment Utilized During Treatment: Gait belt Activity Tolerance: Patient tolerated treatment well Patient left: in chair;with call bell/phone within reach;with family/visitor present Nurse Communication: Mobility status PT Visit Diagnosis: Difficulty in walking, not elsewhere classified (R26.2)     Time: 6644-0347 PT Time Calculation (min) (ACUTE ONLY): 45 min  Charges:  $Gait Training: 8-22 mins $Therapeutic Exercise: 8-22 mins $Therapeutic Activity: 8-22 mins                     Debe Coder PT Acute Rehabilitation Services Pager [phone] Office [phone]    Angela Parrish 07/18/2018, 12:22 PM

## 2018-07-18 NOTE — Progress Notes (Signed)
Patient discharged to home w/ dtr. Given all belongings, instructions, prescriptions, equipment. Verbalized understanding of all instructions. Escorted to pov via w/c.

## 2018-07-18 NOTE — Progress Notes (Signed)
   Subjective: 1 Day Post-Op Procedure(s) (LRB): RIGHT TOTAL HIP ARTHROPLASTY ANTERIOR APPROACH (Right) Patient reports pain as mild.    Plan is to go Home after hospital stay.  Objective: Vital signs in last 24 hours: Temp:  [97.4 F (36.3 C)-98.1 F (36.7 C)] 97.9 F (36.6 C) (11/28 0515) Pulse Rate:  [58-133] 65 (11/28 0515) Resp:  [11-20] 16 (11/28 0515) BP: (94-162)/(49-78) 103/53 (11/28 0515) SpO2:  [96 %-100 %] 97 % (11/28 0831) Weight:  [64.4 kg] 64.4 kg (11/27 0907)  Intake/Output from previous day:  Intake/Output Summary (Last 24 hours) at 07/18/2018 0839 Last data filed at 07/18/2018 0800 Gross per 24 hour  Intake 3648.5 ml  Output 1430 ml  Net 2218.5 ml    Intake/Output this shift: Total I/O In: 240 [P.O.:240] Out: -   Labs: Recent Labs    07/18/18 0519  HGB 11.6*   Recent Labs    07/18/18 0519  WBC 11.3*  RBC 3.63*  HCT 35.4*  PLT 199   Recent Labs    07/18/18 0519  NA 139  K 4.1  CL 109  CO2 23  BUN 23  CREATININE 1.28*  GLUCOSE 137*  CALCIUM 8.6*   No results for input(s): LABPT, INR in the last 72 hours.  EXAM General - Patient is Alert, Appropriate and Oriented Extremity - Neurologically intact Neurovascular intact Dorsiflexion/Plantar flexion intact No cellulitis present Compartment soft Dressing - dressing C/D/I Motor Function - intact, moving foot and toes well on exam.  Hemovac pulled without difficulty.  Past Medical History:  Diagnosis Date  . Abnormal Pap smear    Previous colposcopy  . Arthritis    hip   . Asthma    childhood    . Breast cancer ([employer]) 2000   Status post left lumpectomy  . COPD (chronic obstructive pulmonary disease) ([place])   . Dyspnea    on exertion ; improved with use Spiriva   . Headache    occ  . Hyperlipidemia   . Hypertension   . Personal history of radiation therapy 2000    Assessment/Plan: 1 Day Post-Op Procedure(s) (LRB): RIGHT TOTAL HIP ARTHROPLASTY ANTERIOR APPROACH  (Right) Principal Problem:   OA (osteoarthritis) of hip   Advance diet Up with therapy D/C IV fluids  Discharge home after PT  DVT Prophylaxis - Eliquis Weight-Bearing as tolerated to right leg  Gaynelle Arabian 07/18/2018, 8:39 AM2

## 2018-07-18 NOTE — Progress Notes (Signed)
RN called concerning DME. There was no order for DME. Orders placed. [facility] called for DME to be delivered to the room.

## 2018-07-18 NOTE — Plan of Care (Signed)
Pt is stable. Pain management in progress, effective. Plan of care reviewed with pt.

## 2018-07-19 ENCOUNTER — Encounter (HOSPITAL_COMMUNITY): Payer: Self-pay | Admitting: Orthopedic Surgery

## 2018-07-19 NOTE — Anesthesia Postprocedure Evaluation (Signed)
Anesthesia Post Note  Patient: Angela Parrish  Procedure(s) Performed: RIGHT TOTAL HIP ARTHROPLASTY ANTERIOR APPROACH (Right Hip)     Patient location during evaluation: PACU Anesthesia Type: MAC and Spinal Level of consciousness: oriented and awake and alert Pain management: pain level controlled Vital Signs Assessment: post-procedure vital signs reviewed and stable Respiratory status: spontaneous breathing, respiratory function stable and patient connected to nasal cannula oxygen Cardiovascular status: blood pressure returned to baseline and stable Postop Assessment: no headache, no backache and no apparent nausea or vomiting Anesthetic complications: no    Last Vitals:  Vitals:   07/18/18 0831 07/18/18 0950  BP:  (!) 109/50  Pulse:  67  Resp:  18  Temp:  36.6 C  SpO2: 97% 100%    Last Pain:  Vitals:   07/18/18 1020  TempSrc:   PainSc: [place]

## 2018-07-22 NOTE — Discharge Summary (Signed)
Physician Discharge Summary   Patient ID: Angela Parrish MRN: [id number] DOB/AGE: 12/18/1940 77 y.o.  Admit date: 07/17/2018 Discharge date: 07/18/2018  Primary Diagnosis: Osteoarthritis, right hip   Admission Diagnoses:  Past Medical History:  Diagnosis Date  . Abnormal Pap smear    Previous colposcopy  . Arthritis    hip   . Asthma    childhood    . Breast cancer ([place]) 2000   Status post left lumpectomy  . COPD (chronic obstructive pulmonary disease) ([place])   . Dyspnea    on exertion ; improved with use Spiriva   . Headache    occ  . Hyperlipidemia   . Hypertension   . Personal history of radiation therapy 2000   Discharge Diagnoses:   Principal Problem:   OA (osteoarthritis) of hip  Estimated body mass index is 25.55 kg/m as calculated from the following:   Height as of this encounter: 5' 2.5" (1.588 m).   Weight as of this encounter: 64.4 kg.  Procedure:  Procedure(s) (LRB): RIGHT TOTAL HIP ARTHROPLASTY ANTERIOR APPROACH (Right)   Consults: None  HPI: Angela Parrish is a 77 y.o. female who has advanced end-stage arthritis of their Right  hip with progressively worsening pain and dysfunction.The patient has failed nonoperative management and presents for total hip arthroplasty.   Laboratory Data: Admission on 07/17/2018, Discharged on 07/18/2018  Component Date Value Ref Range Status  . WBC 07/18/2018 11.3* 4.0 - 10.5 K/uL Final  . RBC 07/18/2018 3.63* 3.87 - 5.11 MIL/uL Final  . Hemoglobin 07/18/2018 11.6* 12.0 - 15.0 g/dL Final  . HCT 07/18/2018 35.4* 36.0 - 46.0 % Final  . MCV 07/18/2018 97.5  80.0 - 100.0 fL Final  . MCH 07/18/2018 32.0  26.0 - 34.0 pg Final  . MCHC 07/18/2018 32.8  30.0 - 36.0 g/dL Final  . RDW 07/18/2018 12.2  11.5 - 15.5 % Final  . Platelets 07/18/2018 199  150 - 400 K/uL Final  . nRBC 07/18/2018 0.0  0.0 - 0.2 % Final   Performed at [facility], [place] [address], [place], [place] [address]  . Sodium 07/18/2018  139  135 - 145 mmol/L Final  . Potassium 07/18/2018 4.1  3.5 - 5.1 mmol/L Final  . Chloride 07/18/2018 109  98 - 111 mmol/L Final  . CO2 07/18/2018 23  22 - 32 mmol/L Final  . Glucose, Bld 07/18/2018 137* 70 - 99 mg/dL Final  . BUN 07/18/2018 23  8 - 23 mg/dL Final  . Creatinine, Ser 07/18/2018 1.28* 0.44 - 1.00 mg/dL Final  . Calcium 07/18/2018 8.6* 8.9 - 10.3 mg/dL Final  . GFR calc non Af Amer 07/18/2018 40* >60 mL/min Final  . GFR calc Af Amer 07/18/2018 46* >60 mL/min Final  . Anion gap 07/18/2018 7  5 - 15 Final   Performed at [facility], [place] [address] [place], [place] [address]  Hospital Outpatient Visit on 07/12/2018  Component Date Value Ref Range Status  . MRSA, PCR 07/12/2018 NEGATIVE  NEGATIVE Final  . Staphylococcus aureus 07/12/2018 NEGATIVE  NEGATIVE Final   Comment: (NOTE) The Xpert SA Assay (FDA approved for NASAL specimens in patients 32 years of age and older), is one component of a comprehensive surveillance program. It is not intended to diagnose infection nor to guide or monitor treatment. Performed at [facility], [place] [address] [place], [place] [address]   . aPTT 07/12/2018 28  24 - 36 seconds Final  Performed at [facility], [place] [address], [place], [place] [address]  . Prothrombin Time 07/12/2018 12.3  11.4 - 15.2 seconds Final  . INR 07/12/2018 0.92   Final   Performed at [facility], [place] [address], [place], [place] [address]  . ABO/RH(D) 07/12/2018 O POS   Final  . Antibody Screen 07/12/2018 NEG   Final  . Sample Expiration 07/12/2018 07/20/2018   Final  . Extend sample reason 07/12/2018    Final                   Value:NO TRANSFUSIONS OR PREGNANCY IN THE PAST 3 MONTHS Performed at [facility], [place] [address] [place], [place] [address]   . ABO/RH(D) 07/12/2018    Final                   Value:O POS Performed at [facility],  [place] [address] [place], [place] [address]   Office Visit on 07/03/2018  Component Date Value Ref Range Status  . WBC 07/03/2018 7.9  3.4 - 10.8 x10E3/uL Final  . RBC 07/03/2018 4.24  3.77 - 5.28 x10E6/uL Final  . Hemoglobin 07/03/2018 13.3  11.1 - 15.9 g/dL Final  . Hematocrit 07/03/2018 39.8  34.0 - 46.6 % Final  . MCV 07/03/2018 94  79 - 97 fL Final  . MCH 07/03/2018 31.4  26.6 - 33.0 pg Final  . MCHC 07/03/2018 33.4  31.5 - 35.7 g/dL Final  . RDW 07/03/2018 12.6  12.3 - 15.4 % Final  . Platelets 07/03/2018 256  150 - 450 x10E3/uL Final  . Neutrophils 07/03/2018 63  Not Estab. % Final  . Lymphs 07/03/2018 23  Not Estab. % Final  . Monocytes 07/03/2018 10  Not Estab. % Final  . Eos 07/03/2018 3  Not Estab. % Final  . Basos 07/03/2018 1  Not Estab. % Final  . Neutrophils Absolute 07/03/2018 5.0  1.4 - 7.0 x10E3/uL Final  . Lymphocytes Absolute 07/03/2018 1.8  0.7 - 3.1 x10E3/uL Final  . Monocytes Absolute 07/03/2018 0.8  0.1 - 0.9 x10E3/uL Final  . EOS (ABSOLUTE) 07/03/2018 0.2  0.0 - 0.4 x10E3/uL Final  . Basophils Absolute 07/03/2018 0.1  0.0 - 0.2 x10E3/uL Final  . Immature Granulocytes 07/03/2018 0  Not Estab. % Final  . Immature Grans (Abs) 07/03/2018 0.0  0.0 - 0.1 x10E3/uL Final  . Glucose 07/03/2018 76  65 - 99 mg/dL Final  . BUN 07/03/2018 22  8 - 27 mg/dL Final  . Creatinine, Ser 07/03/2018 1.41* 0.57 - 1.00 mg/dL Final  . GFR calc non Af Amer 07/03/2018 36* >59 mL/min/1.73 Final  . GFR calc Af Amer 07/03/2018 41* >59 mL/min/1.73 Final  . BUN/Creatinine Ratio 07/03/2018 16  12 - 28 Final  . Sodium 07/03/2018 141  134 - 144 mmol/L Final  . Potassium 07/03/2018 4.1  3.5 - 5.2 mmol/L Final  . Chloride 07/03/2018 103  96 - 106 mmol/L Final  . CO2 07/03/2018 22  20 - 29 mmol/L Final  . Calcium 07/03/2018 9.4  8.7 - 10.3 mg/dL Final  . Total Protein 07/03/2018 7.1  6.0 - 8.5 g/dL Final  . Albumin 07/03/2018 4.4  3.5 - 4.8 g/dL Final  . Globulin, Total 07/03/2018 2.7  1.5 -  4.5 g/dL Final  . Albumin/Globulin Ratio 07/03/2018 1.6  1.2 - 2.2 Final  . Bilirubin Total 07/03/2018 0.3  0.0 - 1.2 mg/dL Final  . Alkaline Phosphatase 07/03/2018  102  39 - 117 IU/L Final  . AST 07/03/2018 15  0 - 40 IU/L Final  . ALT 07/03/2018 17  0 - 32 IU/L Final  . Cholesterol, Total 07/03/2018 244* 100 - 199 mg/dL Final  . Triglycerides 07/03/2018 176* 0 - 149 mg/dL Final  . HDL 07/03/2018 76  >39 mg/dL Final  . VLDL Cholesterol Cal 07/03/2018 35  5 - 40 mg/dL Final  . LDL Calculated 07/03/2018 133* 0 - 99 mg/dL Final  . Chol/HDL Ratio 07/03/2018 3.2  0.0 - 4.4 ratio Final   Comment:                                   T. Chol/HDL Ratio                                             Men  Women                               1/2 Avg.Risk  3.4    3.3                                   Avg.Risk  5.0    4.4                                2X Avg.Risk  9.6    7.1                                3X Avg.Risk 23.4   11.0   . TSH 07/03/2018 4.410  0.450 - 4.500 uIU/mL Final     X-Rays:Dg Pelvis Portable  Result Date: 07/17/2018 CLINICAL DATA:  Hip replacement EXAM: PORTABLE PELVIS 1-2 VIEWS COMPARISON:  Earlier today FINDINGS: Total right hip arthroplasty that is located. No periprosthetic fracture. Negative left hip. IMPRESSION: Total right hip arthroplasty without complicating feature. Electronically Signed   By: Monte Fantasia M.D.   On: 07/17/2018 13:59   Dg C-arm 1-60 Min-no Report  Result Date: 07/17/2018 Fluoroscopy was utilized by the requesting physician.  No radiographic interpretation.   Dg Hip Operative Unilat With Pelvis Right  Result Date: 07/17/2018 CLINICAL DATA:  77 y/o F; intraoperative fluoroscopy of hip replacement. EXAM: OPERATIVE RIGHT HIP (WITH PELVIS IF PERFORMED) 2 VIEWS TECHNIQUE: Fluoroscopic spot image(s) were submitted for interpretation post-operatively. COMPARISON:  None. FINDINGS: Two intraoperative fluoroscopic images of right hip replacement. Small foci of  air in soft tissues compatible with surgery. Fluoro time is 6 seconds. IMPRESSION: Intraoperative fluoroscopic images of right hip replacement. Electronically Signed   By: Kristine Garbe M.D.   On: 07/17/2018 13:31    EKG: Orders placed or performed in visit on 10/24/17  . EKG 12-Lead     Hospital Course: Angela Parrish is a 77 y.o. who was admitted to [facility]. They were brought to the operating room on 07/17/2018 and underwent Procedure(s): RIGHT TOTAL HIP ARTHROPLASTY ANTERIOR APPROACH.  Patient tolerated the procedure well and was later transferred to the recovery room and then to the orthopaedic floor for  postoperative care. They were given PO and IV analgesics for pain control following their surgery. They were given 24 hours of postoperative antibiotics of  Anti-infectives (From admission, onward)   Start     Dose/Rate Route Frequency Ordered Stop   07/17/18 1800  ceFAZolin (ANCEF) IVPB 1 g/50 mL premix     1 g 100 mL/hr over 30 Minutes Intravenous Every 6 hours 07/17/18 1500 07/18/18 0012   07/17/18 0915  ceFAZolin (ANCEF) IVPB 2g/100 mL premix     2 g 200 mL/hr over 30 Minutes Intravenous On call to O.R. 07/17/18 5053 07/17/18 1132     and started on DVT prophylaxis in the form of Eliquis.   PT and OT were ordered for total joint protocol. Discharge planning consulted to help with postop disposition and equipment needs.  Patient had a good night on the evening of surgery. They started to get up OOB with therapy on POD #0. Pt was seen during rounds and was ready to go home pending progress with therapy. Hemovac drain was pulled without difficulty. She worked with therapy on POD #1 and was meeting her goals. Pt was discharged to home later that day in stable condition.  Diet: Regular diet Activity: WBAT Follow-up: in 2 weeks with Dr. Wynelle Link Disposition: Home with HEP Discharged Condition: stable   Discharge Instructions    Call MD / Call 911   Complete by:   As directed    If you experience chest pain or shortness of breath, CALL 911 and be transported to the hospital emergency room.  If you develope a fever above 101 F, pus (white drainage) or increased drainage or redness at the wound, or calf pain, call your surgeon's office.   Change dressing   Complete by:  As directed    You may change your dressing on Friday, then change the dressing daily with sterile 4 x 4 inch gauze dressing and paper tape.   Constipation Prevention   Complete by:  As directed    Drink plenty of fluids.  Prune juice may be helpful.  You may use a stool softener, such as Colace (over the counter) 100 mg twice a day.  Use MiraLax (over the counter) for constipation as needed.   Diet - low sodium heart healthy   Complete by:  As directed    Discharge instructions   Complete by:  As directed    Dr. Gaynelle Arabian Total Joint Specialist [facility] [address], [place], [place] [address] [phone]  ANTERIOR APPROACH TOTAL HIP REPLACEMENT POSTOPERATIVE DIRECTIONS   Hip Rehabilitation, Guidelines Following Surgery  The results of a hip operation are greatly improved after range of motion and muscle strengthening exercises. Follow all safety measures which are given to protect your hip. If any of these exercises cause increased pain or swelling in your joint, decrease the amount until you are comfortable again. Then slowly increase the exercises. Call your caregiver if you have problems or questions.   HOME CARE INSTRUCTIONS  Remove items at home which could result in a fall. This includes throw rugs or furniture in walking pathways.  ICE to the affected hip every three hours for 30 minutes at a time and then as needed for pain and swelling.  Continue to use ice on the hip for pain and swelling from surgery. You may notice swelling that will progress down to the foot and ankle.  This is normal after surgery.  Elevate the leg when you are not up  walking on  it.   Continue to use the breathing machine which will help keep your temperature down.  It is common for your temperature to cycle up and down following surgery, especially at night when you are not up moving around and exerting yourself.  The breathing machine keeps your lungs expanded and your temperature down.  DIET You may resume your previous home diet once your are discharged from the hospital.  DRESSING / WOUND CARE / SHOWERING You may shower 3 days after surgery, but keep the wounds dry during showering.  You may use an occlusive plastic wrap (Press'n Seal for example), NO SOAKING/SUBMERGING IN THE BATHTUB.  If the bandage gets wet, change with a clean dry gauze.  If the incision gets wet, pat the wound dry with a clean towel. You may start showering once you are discharged home but do not submerge the incision under water. Just pat the incision dry and apply a dry gauze dressing on daily. Change the surgical dressing daily and reapply a dry dressing each time.  ACTIVITY Walk with your walker as instructed. Use walker as long as suggested by your caregivers. Avoid periods of inactivity such as sitting longer than an hour when not asleep. This helps prevent blood clots.  You may resume a sexual relationship in one month or when given the OK by your doctor.  You may return to work once you are cleared by your doctor.  Do not drive a car for 6 weeks or until released by you surgeon.  Do not drive while taking narcotics.  WEIGHT BEARING Weight bearing as tolerated with assist device (walker, cane, etc) as directed, use it as long as suggested by your surgeon or therapist, typically at least 4-6 weeks.  POSTOPERATIVE CONSTIPATION PROTOCOL Constipation - defined medically as fewer than three stools per week and severe constipation as less than one stool per week.  One of the most common issues patients have following surgery is constipation.  Even if you have a regular bowel pattern at  home, your normal regimen is likely to be disrupted due to multiple reasons following surgery.  Combination of anesthesia, postoperative narcotics, change in appetite and fluid intake all can affect your bowels.  In order to avoid complications following surgery, here are some recommendations in order to help you during your recovery period.  Colace (docusate) - Pick up an over-the-counter form of Colace or another stool softener and take twice a day as long as you are requiring postoperative pain medications.  Take with a full glass of water daily.  If you experience loose stools or diarrhea, hold the colace until you stool forms back up.  If your symptoms do not get better within 1 week or if they get worse, check with your doctor.  Dulcolax (bisacodyl) - Pick up over-the-counter and take as directed by the product packaging as needed to assist with the movement of your bowels.  Take with a full glass of water.  Use this product as needed if not relieved by Colace only.   MiraLax (polyethylene glycol) - Pick up over-the-counter to have on hand.  MiraLax is a solution that will increase the amount of water in your bowels to assist with bowel movements.  Take as directed and can mix with a glass of water, juice, soda, coffee, or tea.  Take if you go more than two days without a movement. Do not use MiraLax more than once per day. Call your doctor if you are still  constipated or irregular after using this medication for 7 days in a row.  If you continue to have problems with postoperative constipation, please contact the office for further assistance and recommendations.  If you experience "the worst abdominal pain ever" or develop nausea or vomiting, please contact the office immediatly for further recommendations for treatment.  ITCHING  If you experience itching with your medications, try taking only a single pain pill, or even half a pain pill at a time.  You can also use Benadryl over the counter for  itching or also to help with sleep.   TED HOSE STOCKINGS Wear the elastic stockings on both legs for three weeks following surgery during the day but you may remove then at night for sleeping.  MEDICATIONS See your medication summary on the "After Visit Summary" that the nursing staff will review with you prior to discharge.  You may have some home medications which will be placed on hold until you complete the course of blood thinner medication.  It is important for you to complete the blood thinner medication as prescribed by your surgeon.  Continue your approved medications as instructed at time of discharge.  PRECAUTIONS If you experience chest pain or shortness of breath - call 911 immediately for transfer to the hospital emergency department.  If you develop a fever greater that 101 F, purulent drainage from wound, increased redness or drainage from wound, foul odor from the wound/dressing, or calf pain - CONTACT YOUR SURGEON.                                                   FOLLOW-UP APPOINTMENTS Make sure you keep all of your appointments after your operation with your surgeon and caregivers. You should call the office at the above phone number and make an appointment for approximately two weeks after the date of your surgery or on the date instructed by your surgeon outlined in the "After Visit Summary".  RANGE OF MOTION AND STRENGTHENING EXERCISES  These exercises are designed to help you keep full movement of your hip joint. Follow your caregiver's or physical therapist's instructions. Perform all exercises about fifteen times, three times per day or as directed. Exercise both hips, even if you have had only one joint replacement. These exercises can be done on a training (exercise) mat, on the floor, on a table or on a bed. Use whatever works the best and is most comfortable for you. Use music or television while you are exercising so that the exercises are a pleasant break in your day.  This will make your life better with the exercises acting as a break in routine you can look forward to.  Lying on your back, slowly slide your foot toward your buttocks, raising your knee up off the floor. Then slowly slide your foot back down until your leg is straight again.  Lying on your back spread your legs as far apart as you can without causing discomfort.  Lying on your side, raise your upper leg and foot straight up from the floor as far as is comfortable. Slowly lower the leg and repeat.  Lying on your back, tighten up the muscle in the front of your thigh (quadriceps muscles). You can do this by keeping your leg straight and trying to raise your heel off the floor. This helps strengthen  the largest muscle supporting your knee.  Lying on your back, tighten up the muscles of your buttocks both with the legs straight and with the knee bent at a comfortable angle while keeping your heel on the floor.   IF YOU ARE TRANSFERRED TO A SKILLED REHAB FACILITY If the patient is transferred to a skilled rehab facility following release from the hospital, a list of the current medications will be sent to the facility for the patient to continue.  When discharged from the skilled rehab facility, please have the facility set up the patient's [place] prior to being released. Also, the skilled facility will be responsible for providing the patient with their medications at time of release from the facility to include their pain medication, the muscle relaxants, and their blood thinner medication. If the patient is still at the rehab facility at time of the two week follow up appointment, the skilled rehab facility will also need to assist the patient in arranging follow up appointment in our office and any transportation needs.  MAKE SURE YOU:  Understand these instructions.  Get help right away if you are not doing well or get worse.    Pick up stool softner and laxative for home use  following surgery while on pain medications. Do not submerge incision under water. Please use good hand washing techniques while changing dressing each day. May shower starting three days after surgery. Please use a clean towel to pat the incision dry following showers. Continue to use ice for pain and swelling after surgery. Do not use any lotions or creams on the incision until instructed by your surgeon.   Do not sit on low chairs, stoools or toilet seats, as it may be difficult to get up from low surfaces   Complete by:  As directed    Driving restrictions   Complete by:  As directed    No driving for two weeks   TED hose   Complete by:  As directed    Use stockings (TED hose) for three weeks on both leg(s).  You may remove them at night for sleeping.   Weight bearing as tolerated   Complete by:  As directed      Allergies as of 07/18/2018   No Known Allergies     Medication List    TAKE these medications   albuterol (2.5 MG/3ML) 0.083% nebulizer solution Commonly known as:  PROVENTIL Take 3 mLs (2.5 mg total) by nebulization every 6 (six) hours as needed for wheezing or shortness of breath.   albuterol 108 (90 Base) MCG/ACT inhaler Commonly known as:  PROVENTIL HFA;VENTOLIN HFA Inhale 2 puffs into the lungs every 6 (six) hours as needed for wheezing or shortness of breath. Prefers VENTOLIN   apixaban 2.5 MG Tabs tablet Commonly known as:  ELIQUIS Take 1 tablet (2.5 mg total) by mouth 2 (two) times daily for 21 days. Then take one 81 mg aspirin once a day for three weeks.   apixaban 2.5 MG Tabs tablet Commonly known as:  ELIQUIS Take 1 tablet (2.5 mg total) by mouth every 12 (twelve) hours for 20 days. Then take one 81 mg aspirin once a day for three weeks. Then discontinue aspirin.   atorvastatin 10 MG tablet Commonly known as:  LIPITOR TAKE ONE (1) TABLET EACH DAY   budesonide-formoterol 160-4.5 MCG/ACT inhaler Commonly known as:  SYMBICORT Inhale 2 puffs into  the lungs 2 (two) times daily.   chlorpheniramine-HYDROcodone 10-8 MG/5ML Suer Commonly  known as:  TUSSIONEX Take 5 mLs by mouth every 12 (twelve) hours as needed for cough.   hydrochlorothiazide 25 MG tablet Commonly known as:  HYDRODIURIL Take 1 tablet (25 mg total) by mouth daily.   HYDROcodone-acetaminophen 5-325 MG tablet Commonly known as:  NORCO/VICODIN Take 1-2 tablets by mouth every 6 (six) hours as needed for moderate pain.   methocarbamol 500 MG tablet Commonly known as:  ROBAXIN Take 1 tablet (500 mg total) by mouth 4 (four) times daily.   phentermine 37.5 MG capsule Take 1 capsule (37.5 mg total) by mouth every morning.   tiotropium 18 MCG inhalation capsule Commonly known as:  SPIRIVA Place 1 capsule (18 mcg total) into inhaler and inhale daily.   traMADol 50 MG tablet Commonly known as:  ULTRAM Take 1-2 tablets (50-100 mg total) by mouth every 6 (six) hours as needed.            Discharge Care Instructions  (From admission, onward)         Start     Ordered   07/17/18 0000  Weight bearing as tolerated     07/17/18 1848   07/17/18 0000  Change dressing    Comments:  You may change your dressing on Friday, then change the dressing daily with sterile 4 x 4 inch gauze dressing and paper tape.   07/17/18 1848         Follow-up Information    Gaynelle Arabian, MD. Schedule an appointment as soon as possible for a visit on 08/01/2018.   Specialty:  Orthopedic Surgery Contact information: [address] [place] [address] [phone]           Signed: Theresa Duty, PA-C Orthopedic Surgery 07/22/2018, 1:00 PM

## 2018-08-02 ENCOUNTER — Encounter: Payer: Medicare HMO | Admitting: Physician Assistant

## 2018-08-30 ENCOUNTER — Ambulatory Visit (INDEPENDENT_AMBULATORY_CARE_PROVIDER_SITE_OTHER): Payer: Medicare HMO | Admitting: Physician Assistant

## 2018-08-30 ENCOUNTER — Encounter: Payer: Self-pay | Admitting: Physician Assistant

## 2018-08-30 VITALS — BP 133/75 | HR 82 | Temp 97.3°F | Ht 62.5 in | Wt 141.0 lb

## 2018-08-30 DIAGNOSIS — L509 Urticaria, unspecified: Secondary | ICD-10-CM

## 2018-08-30 MED ORDER — CEPHALEXIN 500 MG PO CAPS: 500.0000 mg | ORAL_CAPSULE | Freq: Three times a day (TID) | ORAL | 0 refills | Status: DC

## 2018-08-30 MED ORDER — METHYLPREDNISOLONE ACETATE 80 MG/ML IJ SUSP
80.0000 mg | Freq: Once | INTRAMUSCULAR | Status: AC
  Administered 2018-08-30: 80 mg via INTRAMUSCULAR

## 2018-08-30 NOTE — Patient Instructions (Signed)
Hives  Hives are itchy, red, swollen areas on your skin. Hives can show up on any part of your body. Hives often fade within 24 hours (acute hives). New hives can show up after old ones fade. This can go on for many days or weeks (chronic hives). Hives do not spread from person to person (are not contagious).  Hives are caused by your body's response to something that you are allergic to (allergen). These are sometimes called triggers. You can get hives right after being around a trigger, or hours later.  What are the causes?  · Allergies to foods.  · Insect bites or stings.  · Pollen.  · Pets.  · Latex.  · Chemicals.  · Spending time in sunlight, heat, or cold.  · Exercise.  · Stress.  · Some medicines.  · Viruses. This includes the common cold.  · Infections caused by germs (bacteria).  · Allergy shots.  · Blood transfusions.  Sometimes, the cause is not known.  What increases the risk?  · Being a woman.  · Being allergic to foods such as:  ? Citrus fruits.  ? Milk.  ? Eggs.  ? Peanuts.  ? Tree nuts.  ? Shellfish.  · Being allergic to:  ? Medicines.  ? Latex.  ? Insects.  ? Animals.  ? Pollen.  What are the signs or symptoms?    · Raised, itchy, red or white bumps or patches on your skin. These areas may:  ? Get large and swollen.  ? Change in shape and location.  ? Stand alone or connect to each other over a large area of skin.  ? Sting or hurt.  ? Turn white when pressed in the center (blanch).  In very bad cases, your hands, feet, and face may also get swollen. This may happen if hives start deeper in your skin.  How is this treated?  Treatment for this condition depends on your symptoms. Treatment may include:  · Using cool, wet cloths (cool compresses) or taking cool showers to stop the itching.  · Medicines that help:  ? Relieve itching (antihistamines).  ? Reduce swelling (corticosteroids).  ? Treat infection (antibiotics).  · A medicine (omalizumab) that is given as a shot (injection). Your doctor may  prescribe this if you have hives that do not get better even after other treatments.  · In very bad cases, you may need a shot of a medicine called epinephrineto prevent a life-threatening allergic reaction (anaphylaxis).  Follow these instructions at home:  Medicines  · Take or apply over-the-counter and prescription medicines only as told by your doctor.  · If you were prescribed an antibiotic medicine, use it as told by your doctor. Do not stop using it even if you start to feel better.  Skin care  · Apply cool, wet cloths to the hives.  · Do not scratch your skin. Do not rub your skin.  General instructions  · Do not take hot showers or baths. This can make itching worse.  · Do not wear tight clothes.  · Use sunscreen and wear clothes that cover your skin when you are outside.  · Avoid any triggers that cause your hives. Keep a journal to help track what causes your hives. Write down:  ? What medicines you take.  ? What you eat and drink.  ? What products you use on your skin.  · Keep all follow-up visits as told by your doctor. This is important.  Contact   a doctor if:  · Your symptoms are not better with medicine.  · Your joints hurt or are swollen.  Get help right away if:  · You have a fever.  · You have pain in your belly (abdomen).  · Your tongue or lips are swollen.  · Your eyelids are swollen.  · Your chest or throat feels tight.  · You have trouble breathing or swallowing.  These symptoms may be an emergency. Do not wait to see if the symptoms will go away. Get medical help right away. Call your local emergency services (911 in the [place] Do not drive yourself to the hospital.  Summary  · Hives are itchy, red, swollen areas on your skin.  · Treatment for this condition depends on your symptoms.  · Avoid things that cause your hives. Keep a journal to help track what causes your hives.  · Take and apply over-the-counter and prescription medicines only as told by your doctor.  · Keep all follow-up visits  as told by your doctor. This is important.  This information is not intended to replace advice given to you by your health care provider. Make sure you discuss any questions you have with your health care provider.  Document Released: 05/16/2008 Document Revised: 02/20/2018 Document Reviewed: 02/20/2018  Elsevier Interactive Patient Education © 2019 [employer] Inc.

## 2018-08-31 NOTE — Progress Notes (Signed)
BP 133/75 (BP Location: Left Arm)   Pulse 82   Temp (!) 97.3 F (36.3 C) (Oral)   Ht 5' 2.5" (1.588 m)   Wt 141 lb (64 kg)   SpO2 99%   BMI 25.38 kg/m    Subjective:    Patient ID: Angela Parrish, female    DOB: [birth year], 78 y.o.   MRN: [id number]  HPI: Angela Parrish is a 78 y.o. female presenting on 08/30/2018 for Chills; Rash (noticed SAT ); and Hypotension  This patient comes in having 1 day of itching all over.  It is primarily on her trunk and abdomen.  She started out with little red bumps and has scratched them and does have some that are scabbed over.  She denies any new exposure to any type of medication, chemical, cleaning chemical, soap, detergent.  She has had a little bit of decreased blood pressure over the past week.  So she held her hydrochlorothiazide.  When she was here today it did read normal.  Past Medical History:  Diagnosis Date  . Abnormal Pap smear    Previous colposcopy  . Arthritis    hip   . Asthma    childhood    . Breast cancer ([place]) 2000   Status post left lumpectomy  . COPD (chronic obstructive pulmonary disease) ([place])   . Dyspnea    on exertion ; improved with use Spiriva   . Headache    occ  . Hyperlipidemia   . Hypertension   . Personal history of radiation therapy 2000   Relevant past medical, surgical, family and social history reviewed and updated as indicated. Interim medical history since our last visit reviewed. Allergies and medications reviewed and updated. DATA REVIEWED: CHART IN EPIC  Family History reviewed for pertinent findings.  Review of Systems  Constitutional: Negative.   HENT: Negative.   Eyes: Negative.   Respiratory: Negative.   Gastrointestinal: Negative.   Genitourinary: Negative.   Skin: Positive for color change and rash.    Allergies as of 08/30/2018   No Known Allergies     Medication List       Accurate as of August 30, 2018 11:59 PM. Always use your most recent med list.          albuterol (2.5 MG/3ML) 0.083% nebulizer solution Commonly known as:  PROVENTIL Take 3 mLs (2.5 mg total) by nebulization every 6 (six) hours as needed for wheezing or shortness of breath.   albuterol 108 (90 Base) MCG/ACT inhaler Commonly known as:  PROVENTIL HFA;VENTOLIN HFA Inhale 2 puffs into the lungs every 6 (six) hours as needed for wheezing or shortness of breath. Prefers VENTOLIN   atorvastatin 10 MG tablet Commonly known as:  LIPITOR TAKE ONE (1) TABLET EACH DAY   budesonide-formoterol 160-4.5 MCG/ACT inhaler Commonly known as:  SYMBICORT Inhale 2 puffs into the lungs 2 (two) times daily.   cephALEXin 500 MG capsule Commonly known as:  KEFLEX Take 1 capsule (500 mg total) by mouth 3 (three) times daily.   hydrochlorothiazide 25 MG tablet Commonly known as:  HYDRODIURIL Take 1 tablet (25 mg total) by mouth daily.   phentermine 37.5 MG capsule Take 1 capsule (37.5 mg total) by mouth every morning.   tiotropium 18 MCG inhalation capsule Commonly known as:  SPIRIVA Place 1 capsule (18 mcg total) into inhaler and inhale daily.          Objective:    BP 133/75 (BP Location: Left Arm)  Pulse 82   Temp (!) 97.3 F (36.3 C) (Oral)   Ht 5' 2.5" (1.588 m)   Wt 141 lb (64 kg)   SpO2 99%   BMI 25.38 kg/m   No Known Allergies  Wt Readings from Last 3 Encounters:  08/30/18 141 lb (64 kg)  07/17/18 141 lb 15.6 oz (64.4 kg)  07/12/18 142 lb (64.4 kg)    Physical Exam Constitutional:      Appearance: She is well-developed.  HENT:     Head: Normocephalic and atraumatic.  Eyes:     Conjunctiva/sclera: Conjunctivae normal.     Pupils: Pupils are equal, round, and reactive to light.  Cardiovascular:     Rate and Rhythm: Normal rate and regular rhythm.     Heart sounds: Normal heart sounds.  Pulmonary:     Effort: Pulmonary effort is normal.     Breath sounds: Normal breath sounds.  Abdominal:     General: Bowel sounds are normal.     Palpations: Abdomen is  soft.  Skin:    General: Skin is warm and dry.     Findings: Lesion and rash present. Rash is urticarial.  Neurological:     Mental Status: She is alert and oriented to person, place, and time.     Deep Tendon Reflexes: Reflexes are normal and symmetric.  Psychiatric:        Behavior: Behavior normal.        Thought Content: Thought content normal.        Judgment: Judgment normal.     Results for orders placed or performed during the hospital encounter of 07/17/18  CBC  Result Value Ref Range   WBC 11.3 (H) 4.0 - 10.5 K/uL   RBC 3.63 (L) 3.87 - 5.11 MIL/uL   Hemoglobin 11.6 (L) 12.0 - 15.0 g/dL   HCT 35.4 (L) 36.0 - 46.0 %   MCV 97.5 80.0 - 100.0 fL   MCH 32.0 26.0 - 34.0 pg   MCHC 32.8 30.0 - 36.0 g/dL   RDW 12.2 11.5 - 15.5 %   Platelets 199 150 - 400 K/uL   nRBC 0.0 0.0 - 0.2 %  Basic metabolic panel  Result Value Ref Range   Sodium 139 135 - 145 mmol/L   Potassium 4.1 3.5 - 5.1 mmol/L   Chloride 109 98 - 111 mmol/L   CO2 23 22 - 32 mmol/L   Glucose, Bld 137 (H) 70 - 99 mg/dL   BUN 23 8 - 23 mg/dL   Creatinine, Ser 1.28 (H) 0.44 - 1.00 mg/dL   Calcium 8.6 (L) 8.9 - 10.3 mg/dL   GFR calc non Af Amer 40 (L) >60 mL/min   GFR calc Af Amer 46 (L) >60 mL/min   Anion gap 7 5 - 15      Assessment & Plan:   1. Hives - methylPREDNISolone acetate (DEPO-MEDROL) injection 80 mg   Continue all other maintenance medications as listed above.  Follow up plan: No follow-ups on file.  Educational handout given for hives  Terald Sleeper PA-C [employer] [address]  [place], [place] [address] [phone]   08/31/2018, 11:37 AM

## 2018-09-03 DIAGNOSIS — Z471 Aftercare following joint replacement surgery: Secondary | ICD-10-CM | POA: Diagnosis not present

## 2018-09-03 DIAGNOSIS — Z96641 Presence of right artificial hip joint: Secondary | ICD-10-CM | POA: Diagnosis not present

## 2018-09-17 ENCOUNTER — Encounter: Payer: Self-pay | Admitting: Family

## 2018-09-17 ENCOUNTER — Ambulatory Visit (INDEPENDENT_AMBULATORY_CARE_PROVIDER_SITE_OTHER): Payer: Medicare HMO | Admitting: Family

## 2018-09-17 VITALS — BP 137/70 | HR 80 | Temp 97.7°F | Ht 62.5 in | Wt 146.8 lb

## 2018-09-17 DIAGNOSIS — R1011 Right upper quadrant pain: Secondary | ICD-10-CM | POA: Diagnosis not present

## 2018-09-17 NOTE — Progress Notes (Signed)
   Subjective:    Patient ID: Angela Parrish, female    DOB: 07/01/1941, 78 y.o.   MRN: [id number]  Chief Complaint  Patient presents with  . pain RUQ    Abdominal Pain  This is a new problem. The current episode started in the past 7 days. The onset quality is sudden. The problem occurs constantly. The problem has been unchanged. The pain is located in the RUQ. The pain is at a severity of 6/10. The pain is moderate. The quality of the pain is sharp. The abdominal pain does not radiate. Associated symptoms include belching. Pertinent negatives include no constipation, diarrhea, dysuria, fever, flatus, frequency, headaches, hematuria, myalgias, nausea or vomiting. The pain is aggravated by deep breathing. The pain is relieved by nothing. She has tried antacids (constipation) for the symptoms. The treatment provided mild relief. There is no history of gallstones, pancreatitis or PUD.      Review of Systems  Constitutional: Negative for fever.  Gastrointestinal: Positive for abdominal pain. Negative for constipation, diarrhea, flatus, nausea and vomiting.  Genitourinary: Negative for dysuria, frequency and hematuria.  Musculoskeletal: Negative for myalgias.  Neurological: Negative for headaches.  All other systems reviewed and are negative.      Objective:   Physical Exam Vitals signs reviewed.  Constitutional:      General: She is not in acute distress.    Appearance: She is well-developed.  HENT:     Head: Normocephalic and atraumatic.     Right Ear: Tympanic membrane normal.     Left Ear: Tympanic membrane normal.  Eyes:     Pupils: Pupils are equal, round, and reactive to light.  Neck:     Musculoskeletal: Normal range of motion and neck supple.     Thyroid: No thyromegaly.  Cardiovascular:     Rate and Rhythm: Normal rate and regular rhythm.     Heart sounds: Normal heart sounds. No murmur.  Pulmonary:     Effort: Pulmonary effort is normal. No respiratory distress.   Breath sounds: Normal breath sounds. No wheezing.  Abdominal:     General: Bowel sounds are normal. There is no distension.     Palpations: Abdomen is soft.     Tenderness: There is abdominal tenderness (RUQ).  Musculoskeletal: Normal range of motion.        General: No tenderness.  Skin:    General: Skin is warm and dry.  Neurological:     Mental Status: She is alert and oriented to person, place, and time.     Cranial Nerves: No cranial nerve deficit.     Deep Tendon Reflexes: Reflexes are normal and symmetric.  Psychiatric:        Behavior: Behavior normal.        Thought Content: Thought content normal.        Judgment: Judgment normal.       BP 137/70   Pulse 80   Temp 97.7 F (36.5 C) (Oral)   Ht 5' 2.5" (1.588 m)   Wt 146 lb 12.8 oz (66.6 kg)   BMI 26.42 kg/m      Assessment & Plan:  Angela Parrish comes in today with chief complaint of pain RUQ   Diagnosis and orders addressed:  1. RUQ pain Will order [place], scheduled for tomorrow morning since she has drank prior to visit.  If abdominal pain worsen go to ED CBC pending - CBC with Differential/Platelet - US Abdomen Limited RUQ; Future   Evelina Dun, FNP

## 2018-09-17 NOTE — Patient Instructions (Signed)

## 2018-09-18 ENCOUNTER — Other Ambulatory Visit: Payer: Self-pay | Admitting: *Deleted

## 2018-09-18 ENCOUNTER — Ambulatory Visit (HOSPITAL_COMMUNITY)
Admission: RE | Admit: 2018-09-18 | Discharge: 2018-09-18 | Disposition: A | Discharge status: Home / Self Care | Payer: Medicare HMO | Source: Ambulatory Visit | Attending: Family | Admitting: Family

## 2018-09-18 DIAGNOSIS — R1084 Generalized abdominal pain: Secondary | ICD-10-CM

## 2018-09-18 DIAGNOSIS — R1011 Right upper quadrant pain: Secondary | ICD-10-CM | POA: Insufficient documentation

## 2018-09-18 LAB — CBC WITH DIFFERENTIAL/PLATELET
BASOS ABS: 0.1 10*3/uL (ref 0.0–0.2)
Basos: 1 %
EOS (ABSOLUTE): 0.3 10*3/uL (ref 0.0–0.4)
EOS: 4 %
HEMATOCRIT: 36.7 % (ref 34.0–46.6)
HEMOGLOBIN: 12.5 g/dL (ref 11.1–15.9)
IMMATURE GRANS (ABS): 0.1 10*3/uL (ref 0.0–0.1)
Immature Granulocytes: 1 %
LYMPHS ABS: 1.7 10*3/uL (ref 0.7–3.1)
LYMPHS: 19 %
MCH: 31.4 pg (ref 26.6–33.0)
MCHC: 34.1 g/dL (ref 31.5–35.7)
MCV: 92 fL (ref 79–97)
MONOCYTES: 13 %
Monocytes Absolute: 1.2 10*3/uL — ABNORMAL HIGH (ref 0.1–0.9)
Neutrophils Absolute: 5.6 10*3/uL (ref 1.4–7.0)
Neutrophils: 62 %
Platelets: 233 10*3/uL (ref 150–450)
RBC: 3.98 x10E6/uL (ref 3.77–5.28)
RDW: 12 % (ref 11.7–15.4)
WBC: 8.9 10*3/uL (ref 3.4–10.8)

## 2018-09-21 ENCOUNTER — Encounter (HOSPITAL_COMMUNITY): Payer: Self-pay | Admitting: Emergency Medicine

## 2018-09-21 ENCOUNTER — Observation Stay (HOSPITAL_COMMUNITY)
Admission: EM | Admit: 2018-09-21 | Discharge: 2018-09-22 | Disposition: A | Discharge status: Home / Self Care | Payer: Medicare HMO | Attending: Family Medicine | Admitting: Family Medicine

## 2018-09-21 ENCOUNTER — Emergency Department (HOSPITAL_COMMUNITY): Payer: Medicare HMO

## 2018-09-21 ENCOUNTER — Other Ambulatory Visit: Payer: Self-pay

## 2018-09-21 ENCOUNTER — Observation Stay (HOSPITAL_COMMUNITY): Payer: Medicare HMO

## 2018-09-21 DIAGNOSIS — R7989 Other specified abnormal findings of blood chemistry: Secondary | ICD-10-CM | POA: Diagnosis not present

## 2018-09-21 DIAGNOSIS — N183 Chronic kidney disease, stage 3 (moderate): Secondary | ICD-10-CM

## 2018-09-21 DIAGNOSIS — R079 Chest pain, unspecified: Secondary | ICD-10-CM | POA: Diagnosis present

## 2018-09-21 DIAGNOSIS — E785 Hyperlipidemia, unspecified: Secondary | ICD-10-CM

## 2018-09-21 DIAGNOSIS — I82409 Acute embolism and thrombosis of unspecified deep veins of unspecified lower extremity: Secondary | ICD-10-CM

## 2018-09-21 DIAGNOSIS — I2694 Multiple subsegmental pulmonary emboli without acute cor pulmonale: Secondary | ICD-10-CM

## 2018-09-21 DIAGNOSIS — Z96641 Presence of right artificial hip joint: Secondary | ICD-10-CM | POA: Diagnosis not present

## 2018-09-21 DIAGNOSIS — R0781 Pleurodynia: Secondary | ICD-10-CM | POA: Diagnosis present

## 2018-09-21 DIAGNOSIS — J45909 Unspecified asthma, uncomplicated: Secondary | ICD-10-CM | POA: Diagnosis present

## 2018-09-21 DIAGNOSIS — Z79899 Other long term (current) drug therapy: Secondary | ICD-10-CM | POA: Diagnosis not present

## 2018-09-21 DIAGNOSIS — J42 Unspecified chronic bronchitis: Secondary | ICD-10-CM | POA: Diagnosis not present

## 2018-09-21 DIAGNOSIS — Z87891 Personal history of nicotine dependence: Secondary | ICD-10-CM | POA: Diagnosis not present

## 2018-09-21 DIAGNOSIS — D72829 Elevated white blood cell count, unspecified: Secondary | ICD-10-CM

## 2018-09-21 DIAGNOSIS — R05 Cough: Secondary | ICD-10-CM | POA: Diagnosis not present

## 2018-09-21 DIAGNOSIS — I2699 Other pulmonary embolism without acute cor pulmonale: Secondary | ICD-10-CM

## 2018-09-21 DIAGNOSIS — I10 Essential (primary) hypertension: Secondary | ICD-10-CM | POA: Diagnosis not present

## 2018-09-21 DIAGNOSIS — I82401 Acute embolism and thrombosis of unspecified deep veins of right lower extremity: Secondary | ICD-10-CM | POA: Insufficient documentation

## 2018-09-21 DIAGNOSIS — I824Z1 Acute embolism and thrombosis of unspecified deep veins of right distal lower extremity: Secondary | ICD-10-CM | POA: Diagnosis not present

## 2018-09-21 DIAGNOSIS — I129 Hypertensive chronic kidney disease with stage 1 through stage 4 chronic kidney disease, or unspecified chronic kidney disease: Secondary | ICD-10-CM | POA: Diagnosis not present

## 2018-09-21 DIAGNOSIS — R0602 Shortness of breath: Secondary | ICD-10-CM | POA: Diagnosis not present

## 2018-09-21 DIAGNOSIS — N184 Chronic kidney disease, stage 4 (severe): Secondary | ICD-10-CM | POA: Diagnosis present

## 2018-09-21 HISTORY — DX: Other pulmonary embolism without acute cor pulmonale: I26.99

## 2018-09-21 LAB — CBC WITH DIFFERENTIAL/PLATELET
ABS IMMATURE GRANULOCYTES: 0.08 10*3/uL — AB (ref 0.00–0.07)
BASOS PCT: 1 %
Basophils Absolute: 0.1 10*3/uL (ref 0.0–0.1)
EOS PCT: 2 %
Eosinophils Absolute: 0.3 10*3/uL (ref 0.0–0.5)
HCT: 40.3 % (ref 36.0–46.0)
Hemoglobin: 12.6 g/dL (ref 12.0–15.0)
Immature Granulocytes: 1 %
Lymphocytes Relative: 13 %
Lymphs Abs: 1.5 10*3/uL (ref 0.7–4.0)
MCH: 30.7 pg (ref 26.0–34.0)
MCHC: 31.3 g/dL (ref 30.0–36.0)
MCV: 98.1 fL (ref 80.0–100.0)
MONO ABS: 1.3 10*3/uL — AB (ref 0.1–1.0)
MONOS PCT: 11 %
NEUTROS ABS: 8.7 10*3/uL — AB (ref 1.7–7.7)
Neutrophils Relative %: 72 %
PLATELETS: 252 10*3/uL (ref 150–400)
RBC: 4.11 MIL/uL (ref 3.87–5.11)
RDW: 12.5 % (ref 11.5–15.5)
WBC: 12 10*3/uL — ABNORMAL HIGH (ref 4.0–10.5)
nRBC: 0 % (ref 0.0–0.2)

## 2018-09-21 LAB — COMPREHENSIVE METABOLIC PANEL
ALT: 14 U/L (ref 0–44)
AST: 19 U/L (ref 15–41)
Albumin: 4.1 g/dL (ref 3.5–5.0)
Alkaline Phosphatase: 91 U/L (ref 38–126)
Anion gap: 12 (ref 5–15)
BUN: 27 mg/dL — ABNORMAL HIGH (ref 8–23)
CHLORIDE: 99 mmol/L (ref 98–111)
CO2: 25 mmol/L (ref 22–32)
CREATININE: 1.39 mg/dL — AB (ref 0.44–1.00)
Calcium: 9.6 mg/dL (ref 8.9–10.3)
GFR, EST AFRICAN AMERICAN: 42 mL/min — AB (ref >60)
GFR, EST NON AFRICAN AMERICAN: 36 mL/min — AB (ref >60)
Glucose, Bld: 121 mg/dL — ABNORMAL HIGH (ref 70–99)
Potassium: 4.7 mmol/L (ref 3.5–5.1)
Sodium: 136 mmol/L (ref 135–145)
Total Bilirubin: 1.3 mg/dL — ABNORMAL HIGH (ref 0.3–1.2)
Total Protein: 8.2 g/dL — ABNORMAL HIGH (ref 6.5–8.1)

## 2018-09-21 LAB — HEPARIN LEVEL (UNFRACTIONATED)
HEPARIN UNFRACTIONATED: 0.64 [IU]/mL (ref 0.30–0.70)
Heparin Unfractionated: 0.71 IU/mL — ABNORMAL HIGH (ref 0.30–0.70)

## 2018-09-21 LAB — TROPONIN I

## 2018-09-21 LAB — APTT: aPTT: 30 s (ref 24–36)

## 2018-09-21 LAB — D-DIMER, QUANTITATIVE (NOT AT ARMC): D DIMER QUANT: 4.52 ug{FEU}/mL — AB (ref 0.00–0.50)

## 2018-09-21 MED ORDER — ACETAMINOPHEN 325 MG PO TABS: 650.0000 mg | ORAL_TABLET | Freq: Four times a day (QID) | ORAL | Status: DC | PRN

## 2018-09-21 MED ORDER — ALBUTEROL SULFATE (2.5 MG/3ML) 0.083% IN NEBU: 2.5000 mg | INHALATION_SOLUTION | Freq: Four times a day (QID) | RESPIRATORY_TRACT | Status: DC | PRN

## 2018-09-21 MED ORDER — HEPARIN BOLUS VIA INFUSION
3200.0000 [IU] | Freq: Once | INTRAVENOUS | Status: AC
  Administered 2018-09-21: 3200 [IU] via INTRAVENOUS

## 2018-09-21 MED ORDER — UMECLIDINIUM BROMIDE 62.5 MCG/INH IN AEPB
1.0000 | INHALATION_SPRAY | Freq: Every day | RESPIRATORY_TRACT | Status: DC
  Administered 2018-09-22: 1 via RESPIRATORY_TRACT
  Filled 2018-09-21: qty 7

## 2018-09-21 MED ORDER — ONDANSETRON HCL 4 MG PO TABS
4.0000 mg | ORAL_TABLET | Freq: Four times a day (QID) | ORAL | Status: DC | PRN
  Administered 2018-09-21: 4 mg via ORAL
  Filled 2018-09-21: qty 1

## 2018-09-21 MED ORDER — HEPARIN (PORCINE) 25000 UT/250ML-% IV SOLN
900.0000 [IU]/h | INTRAVENOUS | Status: DC
  Administered 2018-09-21: 1000 [IU]/h via INTRAVENOUS
  Filled 2018-09-21: qty 250

## 2018-09-21 MED ORDER — MOMETASONE FURO-FORMOTEROL FUM 200-5 MCG/ACT IN AERO
2.0000 | INHALATION_SPRAY | Freq: Two times a day (BID) | RESPIRATORY_TRACT | Status: DC
  Administered 2018-09-21 – 2018-09-22 (×2): 2 via RESPIRATORY_TRACT
  Filled 2018-09-21: qty 8.8

## 2018-09-21 MED ORDER — IOPAMIDOL (ISOVUE-370) INJECTION 76%
80.0000 mL | Freq: Once | INTRAVENOUS | Status: AC | PRN
  Administered 2018-09-21: 80 mL via INTRAVENOUS

## 2018-09-21 MED ORDER — HYDROCODONE-ACETAMINOPHEN 5-325 MG PO TABS
1.0000 | ORAL_TABLET | ORAL | Status: DC | PRN
  Administered 2018-09-21 (×2): 1 via ORAL
  Administered 2018-09-22: 2 via ORAL
  Filled 2018-09-21: qty 1
  Filled 2018-09-21: qty 2
  Filled 2018-09-21: qty 1

## 2018-09-21 MED ORDER — ASPIRIN 81 MG PO CHEW
324.0000 mg | CHEWABLE_TABLET | Freq: Once | ORAL | Status: AC
  Administered 2018-09-21: 324 mg via ORAL
  Filled 2018-09-21: qty 4

## 2018-09-21 MED ORDER — TIOTROPIUM BROMIDE MONOHYDRATE 18 MCG IN CAPS: 18.0000 ug | ORAL_CAPSULE | Freq: Every day | RESPIRATORY_TRACT | Status: DC

## 2018-09-21 MED ORDER — ACETAMINOPHEN 650 MG RE SUPP: 650.0000 mg | Freq: Four times a day (QID) | RECTAL | Status: DC | PRN

## 2018-09-21 MED ORDER — ATORVASTATIN CALCIUM 10 MG PO TABS
10.0000 mg | ORAL_TABLET | Freq: Every day | ORAL | Status: DC
  Administered 2018-09-21: 10 mg via ORAL
  Filled 2018-09-21: qty 1

## 2018-09-21 MED ORDER — ONDANSETRON HCL 4 MG/2ML IJ SOLN: 4.0000 mg | Freq: Four times a day (QID) | INTRAMUSCULAR | Status: DC | PRN

## 2018-09-21 MED ORDER — SODIUM CHLORIDE 0.9 % IV SOLN
INTRAVENOUS | Status: DC
  Administered 2018-09-21 – 2018-09-22 (×2): via INTRAVENOUS

## 2018-09-21 NOTE — H&P (Signed)
History and Physical  Angela Parrish XMI:[id number] DOB: 1941-04-15 DOA: 09/21/2018  Referring physician: Sabra Heck, MD  PCP: Terald Sleeper, PA-C   Chief Complaint: abdominal pain RUQ  HPI: Angela Parrish is a 78 y.o. female who had hip repair surgery 2 months ago presented to ED for persistent ongoing pain in RUQ that radiates to the back and has been present for the last week.  Pt says that she went to see her PCP earlier in the week and had an [place] of RUQ that was unremarkable. She has no n/v/d.   ED Course:  Pt was noted to have an elevated D dimer and was sent for CTA chest with findings of acute segmental PE on the right.  Her vital signs have remained stable.  She is oxygenating well and no signs of right heart strain. She is being started on IV heparin and admission is requested.    Review of Systems: All systems reviewed and apart from history of presenting illness, are negative.  Past Medical History:  Diagnosis Date  . Abnormal Pap smear    Previous colposcopy  . Arthritis    hip   . Asthma    childhood    . Breast cancer ([place]) 2000   Status post left lumpectomy  . COPD (chronic obstructive pulmonary disease) ([place])   . Dyspnea    on exertion ; improved with use Spiriva   . Headache    occ  . Hyperlipidemia   . Hypertension   . Personal history of radiation therapy 2000   Past Surgical History:  Procedure Laterality Date  . BREAST LUMPECTOMY  2000   Left breast  . CATARACT EXTRACTION, BILATERAL    . COLONOSCOPY N/A 09/16/2014   Procedure: COLONOSCOPY;  Surgeon: Rogene Houston, MD;  Location: [facility];  Service: Endoscopy;  Laterality: N/A;  830 - moved to 9:15 - Ann to notify  . KNEE CARTILAGE SURGERY Right   . TOTAL HIP ARTHROPLASTY Right 07/17/2018   Procedure: RIGHT TOTAL HIP ARTHROPLASTY ANTERIOR APPROACH;  Surgeon: Gaynelle Arabian, MD;  Location: [facility];  Service: Orthopedics;  Laterality: Right;  170min   Social History:  reports that she quit smoking about  11 years ago. Her smoking use included cigarettes. She has never used smokeless tobacco. She reports that she does not drink alcohol or use drugs.  No Known Allergies  Family History  Problem Relation Age of Onset  . Heart disease Father   . Lung cancer Father        smoker  . Heart attack Father   . Colon cancer Mother 39  . Heart attack Mother   . Heart disease Sister   . Hypertension Brother   . Heart attack Brother   . Leukemia Brother   . Obesity Daughter   . Anxiety disorder Daughter   . Hypertension Brother     Prior to Admission medications   Medication Sig Start Date End Date Taking? Authorizing Provider  albuterol (PROVENTIL HFA;VENTOLIN HFA) 108 (90 Base) MCG/ACT inhaler Inhale 2 puffs into the lungs every 6 (six) hours as needed for wheezing or shortness of breath. Prefers VENTOLIN 10/08/17  Yes Terald Sleeper, PA-C  albuterol (PROVENTIL) (2.5 MG/3ML) 0.083% nebulizer solution Take 3 mLs (2.5 mg total) by nebulization every 6 (six) hours as needed for wheezing or shortness of breath. 06/20/17  Yes Terald Sleeper, PA-C  atorvastatin (LIPITOR) 10 MG tablet TAKE ONE (1) TABLET EACH DAY 07/03/18  Yes Particia Nearing  S, PA-C  budesonide-formoterol (SYMBICORT) 160-4.5 MCG/ACT inhaler Inhale 2 puffs into the lungs 2 (two) times daily. 07/03/18  Yes Terald Sleeper, PA-C  hydrochlorothiazide (HYDRODIURIL) 25 MG tablet Take 1 tablet (25 mg total) by mouth daily. 07/03/18  Yes Terald Sleeper, PA-C  tiotropium (SPIRIVA) 18 MCG inhalation capsule Place 1 capsule (18 mcg total) into inhaler and inhale daily. 07/03/18  Yes Terald Sleeper, PA-C   Physical Exam: Vitals:   09/21/18 0947 09/21/18 1100 09/21/18 1200 09/21/18 1230  BP: (!) 147/79 126/60 (!) 114/59 118/64  Pulse: 79 72 71 78  Resp: 18 16 15 17   Temp: 98.6 F (37 C)     TempSrc: Oral     SpO2: 99% 98% 99% 98%  Weight:      Height:         General exam: Moderately built and nourished patient, lying comfortably supine on  the gurney in no obvious distress.  Head, eyes and ENT: Nontraumatic and normocephalic. Pupils equally reacting to light and accommodation. Oral mucosa moist.  Neck: Supple. No JVD, carotid bruit or thyromegaly.  Lymphatics: No lymphadenopathy.  Respiratory system: Clear to auscultation. No increased work of breathing.  Cardiovascular system: S1 and S2 heard, RRR. No JVD, murmurs, gallops, clicks or pedal edema.  Gastrointestinal system: Abdomen is nondistended, soft and nontender. Normal bowel sounds heard. No organomegaly or masses appreciated.  Central nervous system: Alert and oriented. No focal neurological deficits.  Extremities: Symmetric 5 x 5 power. Peripheral pulses symmetrically felt.   Skin: No rashes or acute findings.  Musculoskeletal system: Negative exam.  Psychiatry: Pleasant and cooperative.  Labs on Admission:  Basic Metabolic Panel: Recent Labs  Lab 09/21/18 1033  NA 136  K 4.7  CL 99  CO2 25  GLUCOSE 121*  BUN 27*  CREATININE 1.39*  CALCIUM 9.6   Liver Function Tests: Recent Labs  Lab 09/21/18 1033  AST 19  ALT 14  ALKPHOS 91  BILITOT 1.3*  PROT 8.2*  ALBUMIN 4.1   No results for input(s): LIPASE, AMYLASE in the last 168 hours. No results for input(s): AMMONIA in the last 168 hours. CBC: Recent Labs  Lab 09/17/18 1240 09/21/18 1033  WBC 8.9 12.0*  NEUTROABS 5.6 8.7*  HGB 12.5 12.6  HCT 36.7 40.3  MCV 92 98.1  PLT 233 252   Cardiac Enzymes: Recent Labs  Lab 09/21/18 1033  TROPONINI <0.03    BNP (last 3 results) No results for input(s): PROBNP in the last 8760 hours. CBG: No results for input(s): GLUCAP in the last 168 hours.  Radiological Exams on Admission: Dg Chest 2 View  Result Date: 09/21/2018 CLINICAL DATA:  Cough, shortness of breath. EXAM: CHEST - 2 VIEW COMPARISON:  Radiographs of Dec 23, 2014. FINDINGS: The heart size and mediastinal contours are within normal limits. No pneumothorax or pleural effusion is  noted. Atherosclerosis of thoracic aorta is noted. Biapical scarring is noted. Left basilar scarring is noted. No acute pulmonary abnormality is noted. The visualized skeletal structures are unremarkable. IMPRESSION: No active cardiopulmonary disease. Aortic Atherosclerosis (ICD10-I70.0). Electronically Signed   By: Marijo Conception, M.D.   On: 09/21/2018 12:13   Ct Angio Chest Pe W And/or Wo Contrast  Result Date: 09/21/2018 CLINICAL DATA:  Chest pain. EXAM: CT ANGIOGRAPHY CHEST WITH CONTRAST TECHNIQUE: Multidetector CT imaging of the chest was performed using the standard protocol during bolus administration of intravenous contrast. Multiplanar CT image reconstructions and MIPs were obtained to evaluate the vascular anatomy.  CONTRAST:  36mL ISOVUE-370 IOPAMIDOL (ISOVUE-370) INJECTION 76% COMPARISON:  Radiographs of same day. FINDINGS: Cardiovascular: Atherosclerosis of thoracic aorta is noted without aneurysm or dissection. Filling defects are noted in lower lobe branches of right pulmonary artery consistent with acute pulmonary emboli. RV/LV ratio of 0.77 is noted which is within normal limits. Normal cardiac size. No pericardial effusion is noted. Mediastinum/Nodes: No enlarged mediastinal, hilar, or axillary lymph nodes. Thyroid gland, trachea, and esophagus demonstrate no significant findings. Lungs/Pleura: No pneumothorax or pleural effusion is noted. Mild biapical scarring is noted. No acute pulmonary disease is noted. Upper Abdomen: No acute abnormality. Musculoskeletal: No chest wall abnormality. No acute or significant osseous findings. Review of the MIP images confirms the above findings. IMPRESSION: Acute pulmonary emboli are noted in lower lobe branches of right pulmonary artery. Critical Value/emergent results were called by telephone at the time of interpretation on 09/21/2018 at 1:28 pm to Dr. Noemi Chapel , who verbally acknowledged these results. Aortic Atherosclerosis (ICD10-I70.0).  Electronically Signed   By: Marijo Conception, M.D.   On: 09/21/2018 13:28   EKG: Independently reviewed. NSR on acute ST/T wave abnormalities  Assessment/Plan Principal Problem:   Acute pulmonary embolism (HCC) Active Problems:   Essential hypertension   Asthma, chronic   Chronic bronchitis (HCC)   Hyperlipidemia   Positive D dimer   Leukocytosis   CKD (chronic kidney disease) stage 3, GFR 30-59 ml/min (HCC)  1. Acute right pulmonary emboli - Pt is hemodynamically stable now, She is being anticoagulated with IV heparin.  She will likely be transitioned to oral apixaban on 09/22/18.  Check 2D echocardiogram. Check venous doppler of lower extremities.  TED hoses ordered.  Supplemental oxygen ordered as needed.   2. Essential hypertension - holding home meds for now as BP is low normal.  3. Chronic asthmatic bronchitis - resumed home medications.  4. Leukocytosis - secondary to acute PE, no s/s of infection found.  5. Stage 3 CKD - renally dose medications as appropriate.   DVT Prophylaxis: heparin infusion  Code Status: Full   Family Communication: patient updated at bedside   Disposition Plan: home in 1-2 days anticipated   Time spent: 56 minutes   Irwin Brakeman, MD [facility]

## 2018-09-21 NOTE — ED Triage Notes (Signed)
Pt c/o RUQ abdominal pain that radiates to back since last Sunday. Pt states she had a US performed on Tuesday to r/o gallbladder, but reports it came back clear. Pt states pain has worsened. Denies n/v/d, but endorses frequent belching.

## 2018-09-21 NOTE — ED Notes (Signed)
Patient transported to CT 

## 2018-09-21 NOTE — ED Provider Notes (Signed)
[facility] Provider Note   CSN: [id number] Arrival date & time: 09/21/18  0930     History   Chief Complaint Chief Complaint  Patient presents with  . Abdominal Pain    HPI Angela Parrish is a 78 y.o. female.  HPI  78 year old female, she has a known history of breast cancer as well as a right-sided hip replacement which was performed in November 2019 who presents with several days of right-sided lower chest pain.  This actually started on Sunday, she felt like she might of been leaning against something the wrong way and had some pain however it is been intermittent for several days before becoming more constant since yesterday.  Worse with deep breathing, not associated with swelling of the legs fevers chills nausea or vomiting and she denies any coughing.  She has had a slight bit of postnasal drip but no other significant coughing or phlegm production.  Symptoms are now persistent for the last 12 hours.  Earlier in the week the patient had gone to her family doctor who did an ultrasound which was reportedly negative for any gallbladder abnormalities  Past Medical History:  Diagnosis Date  . Abnormal Pap smear    Previous colposcopy  . Arthritis    hip   . Asthma    childhood    . Breast cancer ([place]) 2000   Status post left lumpectomy  . COPD (chronic obstructive pulmonary disease) ([place])   . Dyspnea    on exertion ; improved with use Spiriva   . Headache    occ  . Hyperlipidemia   . Hypertension   . Personal history of radiation therapy 2000    Patient Active Problem List   Diagnosis Date Noted  . Nonintractable headache 10/08/2017  . Pain of right hip joint 10/08/2017  . Chronic pain of right knee 10/08/2017  . Injury of toe on right foot 03/13/2017  . Closed fracture of third toe of right foot 03/13/2017  . Urticaria 01/12/2017  . Weight gain 01/12/2017  . Actinic keratosis 01/12/2017  . Insomnia 07/11/2016  . Body mass index  26.0-26.9, adult 04/07/2016  . Asthma, chronic 04/07/2016  . Chronic bronchitis ([place]) 04/07/2016  . Hyperlipidemia 04/07/2016  . OA (osteoarthritis) of hip 04/07/2016  . Essential hypertension 11/08/2011  . Breast cancer ([place]) 11/08/2011  . Atypical chest pain 11/08/2011  . Abnormal Pap smear of cervix 06/29/2011    Past Surgical History:  Procedure Laterality Date  . BREAST LUMPECTOMY  2000   Left breast  . CATARACT EXTRACTION, BILATERAL    . COLONOSCOPY N/A 09/16/2014   Procedure: COLONOSCOPY;  Surgeon: Rogene Houston, MD;  Location: [facility];  Service: Endoscopy;  Laterality: N/A;  830 - moved to 9:15 - Ann to notify  . KNEE CARTILAGE SURGERY Right   . TOTAL HIP ARTHROPLASTY Right 07/17/2018   Procedure: RIGHT TOTAL HIP ARTHROPLASTY ANTERIOR APPROACH;  Surgeon: Gaynelle Arabian, MD;  Location: [facility];  Service: Orthopedics;  Laterality: Right;  111min     OB History    Gravida  1   Para  1   Term  1   Preterm  0   AB  0   Living  1     SAB  0   TAB  0   Ectopic  0   Multiple  0   Live Births               Home Medications  Prior to Admission medications   Medication Sig Start Date End Date Taking? Authorizing Provider  albuterol (PROVENTIL HFA;VENTOLIN HFA) 108 (90 Base) MCG/ACT inhaler Inhale 2 puffs into the lungs every 6 (six) hours as needed for wheezing or shortness of breath. Prefers VENTOLIN 10/08/17   Terald Sleeper, PA-C  albuterol (PROVENTIL) (2.5 MG/3ML) 0.083% nebulizer solution Take 3 mLs (2.5 mg total) by nebulization every 6 (six) hours as needed for wheezing or shortness of breath. 06/20/17   Terald Sleeper, PA-C  atorvastatin (LIPITOR) 10 MG tablet TAKE ONE (1) TABLET EACH DAY 07/03/18   Terald Sleeper, PA-C  budesonide-formoterol [facility]) 160-4.5 MCG/ACT inhaler Inhale 2 puffs into the lungs 2 (two) times daily. 07/03/18   Terald Sleeper, PA-C  hydrochlorothiazide (HYDRODIURIL) 25 MG tablet Take 1 tablet (25 mg total) by mouth  daily. 07/03/18   Terald Sleeper, PA-C  tiotropium (SPIRIVA) 18 MCG inhalation capsule Place 1 capsule (18 mcg total) into inhaler and inhale daily. 07/03/18   Terald Sleeper, PA-C    Family History Family History  Problem Relation Age of Onset  . Heart disease Father   . Lung cancer Father        smoker  . Heart attack Father   . Colon cancer Mother 39  . Heart attack Mother   . Heart disease Sister   . Hypertension Brother   . Heart attack Brother   . Leukemia Brother   . Obesity Daughter   . Anxiety disorder Daughter   . Hypertension Brother     Social History Social History   Tobacco Use  . Smoking status: Former Smoker    Types: Cigarettes    Last attempt to quit: 08/14/2007    Years since quitting: 11.1  . Smokeless tobacco: Never Used  Substance Use Topics  . Alcohol use: No  . Drug use: No     Allergies   Patient has no known allergies.   Review of Systems Review of Systems  All other systems reviewed and are negative.    Physical Exam Updated Vital Signs BP (!) 147/79 (BP Location: Left Arm)   Pulse 79   Temp 98.6 F (37 C) (Oral)   Resp 18   Ht 1.588 m (5' 2.5")   Wt 64.4 kg   SpO2 99%   BMI 25.56 kg/m   Physical Exam Vitals signs and nursing note reviewed.  Constitutional:      General: She is not in acute distress.    Appearance: She is well-developed.  HENT:     Head: Normocephalic and atraumatic.     Mouth/Throat:     Pharynx: No oropharyngeal exudate.  Eyes:     General: No scleral icterus.       Right eye: No discharge.        Left eye: No discharge.     Conjunctiva/sclera: Conjunctivae normal.     Pupils: Pupils are equal, round, and reactive to light.  Neck:     Musculoskeletal: Normal range of motion and neck supple.     Thyroid: No thyromegaly.     Vascular: No JVD.  Cardiovascular:     Rate and Rhythm: Normal rate and regular rhythm.     Heart sounds: Normal heart sounds. No murmur. No friction rub. No gallop.     Pulmonary:     Effort: Pulmonary effort is normal. No respiratory distress.     Breath sounds: Normal breath sounds. No wheezing or rales.     Comments:  There is chest tenderness on the right lower anterior and lateral ribs, there is no rash in this location Chest:     Chest wall: Tenderness present.  Abdominal:     General: Bowel sounds are normal. There is no distension.     Palpations: Abdomen is soft. There is no mass.     Tenderness: There is no abdominal tenderness.  Musculoskeletal: Normal range of motion.        General: No tenderness.  Lymphadenopathy:     Cervical: No cervical adenopathy.  Skin:    General: Skin is warm and dry.     Findings: No erythema or rash.  Neurological:     Mental Status: She is alert.     Coordination: Coordination normal.  Psychiatric:        Behavior: Behavior normal.      ED Treatments / Results  Labs (all labs ordered are listed, but only abnormal results are displayed) Labs Reviewed  D-DIMER, QUANTITATIVE (NOT AT [facility])  CBC WITH DIFFERENTIAL/PLATELET  COMPREHENSIVE METABOLIC PANEL  TROPONIN I    EKG EKG Interpretation  Date/Time:  Saturday September 21 2018 10:26:16 EST Ventricular Rate:  77 PR Interval:    QRS Duration: 88 QT Interval:  369 QTC Calculation: 418 R Axis:   7 Text Interpretation:  Sinus rhythm RSR' in V1 or V2, right VCD or RVH ST elevation, consider inferior injury Baseline wander in lead(s) I aVR No old tracing to compare Confirmed by Noemi Chapel [phone]) on 09/21/2018 11:41:17 AM   Radiology No results found.  Procedures .Critical Care Performed by: Noemi Chapel, MD Authorized by: Noemi Chapel, MD   Critical care provider statement:    Critical care time (minutes):  35   Critical care time was exclusive of:  Separately billable procedures and treating other patients and teaching time   Critical care was necessary to treat or prevent imminent or life-threatening deterioration of the following  conditions: PE.   Critical care was time spent personally by me on the following activities:  Blood draw for specimens, development of treatment plan with patient or surrogate, discussions with consultants, evaluation of patient's response to treatment, examination of patient, obtaining history from patient or surrogate, ordering and performing treatments and interventions, ordering and review of laboratory studies, ordering and review of radiographic studies, pulse oximetry, re-evaluation of patient's condition and review of old charts   (including critical care time)  Medications Ordered in ED Medications  aspirin chewable tablet 324 mg (has no administration in time range)     Initial Impression / Assessment and Plan / ED Course  I have reviewed the triage vital signs and the nursing notes.  Pertinent labs & imaging results that were available during my care of the patient were reviewed by me and considered in my medical decision making (see chart for details).  Clinical Course as of Sep 22 1319  Sat Sep 21, 2018  1141 Testing shows baseline creatinine is close to normal with a creatinine of 1.39, white blood cell count of 12,000, troponin is negative but the d-dimer is elevated at 4.52 which will necessitate a CT scan to rule out pulmonary embolism.  Vital signs remained stable   [BM]    Clinical Course User Index [BM] Noemi Chapel, MD    The patient is not tachycardic hypoxic hypotensive or febrile, lung exam is normal and she does have reproducible tenderness over the chest however given recent surgery on the hip and a history of cancer will order  a d-dimer to rule out pulmonary embolism given the pleuritic nature of her pain.  The patient is agreeable to the plan.  She appears hemodynamically stable at this time.  The abdomen is totally soft and nontender with no right upper quadrant tenderness.  I reviewed the electronic medical record and the ultrasound which was performed on  January 29 showing normal right upper quadrant ultrasound  I have personally seen and interpreted the CT angiogram - there is a large PE on the right side - the patient is critically ill and needs heparin drip and admission.  Final Clinical Impressions(s) / ED Diagnoses   Final diagnoses:  Multiple subsegmental pulmonary emboli without acute cor pulmonale      Noemi Chapel, MD 09/21/18 [phone]

## 2018-09-21 NOTE — Progress Notes (Signed)
ANTICOAGULATION CONSULT NOTE - Initial Consult  Pharmacy Consult for  Heparin dosing Indication: pulmonary embolus  No Known Allergies  Patient Measurements: Height: 5' 2.5" (158.8 cm) Weight: 142 lb (64.4 kg) IBW/kg (Calculated) : 51.25 Heparin Dosing Weight: HEPARIN DW (KG): 64.2   Vital Signs: Temp: 98.6 F (37 C) (02/01 0947) Temp Source: Oral (02/01 0947) BP: 118/64 (02/01 1230) Pulse Rate: 78 (02/01 1230)  Labs: Recent Labs    09/21/18 1033 09/21/18 1346  HGB 12.6  --   HCT 40.3  --   PLT 252  --   APTT  --  30  CREATININE 1.39*  --   TROPONINI <0.03  --     Estimated Creatinine Clearance: 29.8 mL/min (A) (by C-G formula based on SCr of 1.39 mg/dL (H)).   Medical History: Past Medical History:  Diagnosis Date  . Abnormal Pap smear    Previous colposcopy  . Arthritis    hip   . Asthma    childhood    . Breast cancer ([place]) 2000   Status post left lumpectomy  . COPD (chronic obstructive pulmonary disease) ([place])   . Dyspnea    on exertion ; improved with use Spiriva   . Headache    occ  . Hyperlipidemia   . Hypertension   . Personal history of radiation therapy 2000    Assessment:  Pharmacy consulted to dose heparin for ths 78 yo female with acute PE. D-Dimer is elevated and CTA of chest shows acute segmental PE on right side. Plan is to transition to  apixaban on 09-22-2018. Patient hasn't been on any prior anti-coagulation.  Goal of Therapy:  Heparin level 0.3-0.7 units/ml Monitor platelets by anticoagulation protocol: Yes   Plan:  Give 3200 units bolus x 1 Start heparin infusion at 1000 units/hr Check anti-Xa level in 6-8 hours and daily while on heparin Continue to monitor H&H and platelets  Despina Pole, Pharm. D. Clinical Pharmacist 09/21/2018 2:28 PM

## 2018-09-21 NOTE — Progress Notes (Addendum)
[facility] for IV Heparin--transition to oral apixaban Indication: pulmonary embolus  No Known Allergies  Patient Measurements: Height: 5' 2.5" (158.8 cm) Weight: 142 lb (64.4 kg) IBW/kg (Calculated) : 51.25 Heparin Dosing Weight: HEPARIN DW (KG): 64.2   Vital Signs: Temp: 98.7 F (37.1 C) (02/01 2257) Temp Source: Oral (02/01 2257) BP: 112/56 (02/01 2257) Pulse Rate: 75 (02/01 2257)  Labs: Recent Labs    09/21/18 1033 09/21/18 1346 09/21/18 1438 09/21/18 2212  HGB 12.6  --   --   --   HCT 40.3  --   --   --   PLT 252  --   --   --   APTT  --  30  --   --   HEPARINUNFRC  --   --  0.64 0.71*  CREATININE 1.39*  --   --   --   TROPONINI <0.03  --   --   --     Estimated Creatinine Clearance: 29.8 mL/min (A) (by C-G formula based on SCr of 1.39 mg/dL (H)).   Medical History: Past Medical History:  Diagnosis Date  . Abnormal Pap smear    Previous colposcopy  . Arthritis    hip   . Asthma    childhood    . Breast cancer ([place]) 2000   Status post left lumpectomy  . COPD (chronic obstructive pulmonary disease) ([place])   . Dyspnea    on exertion ; improved with use Spiriva   . Headache    occ  . Hyperlipidemia   . Hypertension   . Personal history of radiation therapy 2000    Assessment:  Pharmacy consulted to dose heparin for ths 78 yo female with acute PE. D-Dimer is elevated and CTA of chest shows acute segmental PE on right side. Plan is to transition to  apixaban on 09-22-2018. Patient hasn't been on any prior anti-coagulation.  2/1 PM update: heparin level is just above goal tonight  Goal of Therapy:  Heparin level 0.3-0.7 units/ml Monitor platelets by anticoagulation protocol: Yes   Plan:  Discontinue heparin drip Start apixaban 10mg  bid x7 days (14 doses)   then start apixaban 5mg  bid thereafter (no renal adjustment needed for PE tx) Monitor patient for signs and symptoms of bleeding  Despina Pole, Pharm.  D. Clinical Pharmacist 09/22/2018 9:03 AM

## 2018-09-22 ENCOUNTER — Observation Stay (HOSPITAL_BASED_OUTPATIENT_CLINIC_OR_DEPARTMENT_OTHER): Payer: Medicare HMO

## 2018-09-22 ENCOUNTER — Observation Stay (HOSPITAL_COMMUNITY): Payer: Medicare HMO

## 2018-09-22 DIAGNOSIS — R0781 Pleurodynia: Secondary | ICD-10-CM | POA: Diagnosis not present

## 2018-09-22 DIAGNOSIS — I824Z1 Acute embolism and thrombosis of unspecified deep veins of right distal lower extremity: Secondary | ICD-10-CM

## 2018-09-22 DIAGNOSIS — I10 Essential (primary) hypertension: Secondary | ICD-10-CM | POA: Diagnosis not present

## 2018-09-22 DIAGNOSIS — I2699 Other pulmonary embolism without acute cor pulmonale: Secondary | ICD-10-CM | POA: Diagnosis not present

## 2018-09-22 DIAGNOSIS — R7989 Other specified abnormal findings of blood chemistry: Secondary | ICD-10-CM | POA: Diagnosis not present

## 2018-09-22 DIAGNOSIS — D72829 Elevated white blood cell count, unspecified: Secondary | ICD-10-CM | POA: Diagnosis not present

## 2018-09-22 DIAGNOSIS — I82411 Acute embolism and thrombosis of right femoral vein: Secondary | ICD-10-CM | POA: Diagnosis not present

## 2018-09-22 DIAGNOSIS — I82401 Acute embolism and thrombosis of unspecified deep veins of right lower extremity: Secondary | ICD-10-CM

## 2018-09-22 DIAGNOSIS — N183 Chronic kidney disease, stage 3 (moderate): Secondary | ICD-10-CM | POA: Diagnosis not present

## 2018-09-22 DIAGNOSIS — E785 Hyperlipidemia, unspecified: Secondary | ICD-10-CM | POA: Diagnosis not present

## 2018-09-22 DIAGNOSIS — J42 Unspecified chronic bronchitis: Secondary | ICD-10-CM | POA: Diagnosis not present

## 2018-09-22 HISTORY — DX: Acute embolism and thrombosis of unspecified deep veins of right lower extremity: I82.401

## 2018-09-22 LAB — CBC WITH DIFFERENTIAL/PLATELET
Abs Immature Granulocytes: 0.1 10*3/uL — ABNORMAL HIGH (ref 0.00–0.07)
Basophils Absolute: 0.1 10*3/uL (ref 0.0–0.1)
Basophils Relative: 1 %
Eosinophils Absolute: 0.5 10*3/uL (ref 0.0–0.5)
Eosinophils Relative: 5 %
HCT: 33.4 % — ABNORMAL LOW (ref 36.0–46.0)
Hemoglobin: 10.5 g/dL — ABNORMAL LOW (ref 12.0–15.0)
Immature Granulocytes: 1 %
LYMPHS ABS: 1.7 10*3/uL (ref 0.7–4.0)
Lymphocytes Relative: 17 %
MCH: 31.1 pg (ref 26.0–34.0)
MCHC: 31.4 g/dL (ref 30.0–36.0)
MCV: 98.8 fL (ref 80.0–100.0)
Monocytes Absolute: 1.2 10*3/uL — ABNORMAL HIGH (ref 0.1–1.0)
Monocytes Relative: 12 %
Neutro Abs: 6.2 10*3/uL (ref 1.7–7.7)
Neutrophils Relative %: 64 %
Platelets: 223 10*3/uL (ref 150–400)
RBC: 3.38 MIL/uL — ABNORMAL LOW (ref 3.87–5.11)
RDW: 12.4 % (ref 11.5–15.5)
WBC: 9.7 10*3/uL (ref 4.0–10.5)
nRBC: 0 % (ref 0.0–0.2)

## 2018-09-22 LAB — COMPREHENSIVE METABOLIC PANEL
ALT: 10 U/L (ref 0–44)
AST: 15 U/L (ref 15–41)
Albumin: 3.1 g/dL — ABNORMAL LOW (ref 3.5–5.0)
Alkaline Phosphatase: 70 U/L (ref 38–126)
Anion gap: 9 (ref 5–15)
BUN: 27 mg/dL — ABNORMAL HIGH (ref 8–23)
CO2: 23 mmol/L (ref 22–32)
Calcium: 8.6 mg/dL — ABNORMAL LOW (ref 8.9–10.3)
Chloride: 102 mmol/L (ref 98–111)
Creatinine, Ser: 1.4 mg/dL — ABNORMAL HIGH (ref 0.44–1.00)
GFR calc Af Amer: 42 mL/min — ABNORMAL LOW (ref >60)
GFR calc non Af Amer: 36 mL/min — ABNORMAL LOW (ref >60)
GLUCOSE: 124 mg/dL — AB (ref 70–99)
Potassium: 4.2 mmol/L (ref 3.5–5.1)
Sodium: 134 mmol/L — ABNORMAL LOW (ref 135–145)
Total Bilirubin: 0.4 mg/dL (ref 0.3–1.2)
Total Protein: 6.5 g/dL (ref 6.5–8.1)

## 2018-09-22 LAB — ECHOCARDIOGRAM COMPLETE
Height: 62.5 in
WEIGHTICAEL: 2272 [oz_av]

## 2018-09-22 LAB — HEPARIN LEVEL (UNFRACTIONATED): Heparin Unfractionated: 0.68 IU/mL (ref 0.30–0.70)

## 2018-09-22 LAB — MAGNESIUM: Magnesium: 2 mg/dL (ref 1.7–2.4)

## 2018-09-22 MED ORDER — ELIQUIS 5 MG VTE STARTER PACK: ORAL_TABLET | ORAL | 0 refills | Status: DC

## 2018-09-22 MED ORDER — APIXABAN 5 MG PO TABS: 5.0000 mg | ORAL_TABLET | Freq: Two times a day (BID) | ORAL | Status: DC

## 2018-09-22 MED ORDER — APIXABAN 5 MG PO TABS
10.0000 mg | ORAL_TABLET | Freq: Two times a day (BID) | ORAL | Status: DC
  Filled 2018-09-22 (×2): qty 2

## 2018-09-22 MED ORDER — HYDROCODONE-ACETAMINOPHEN 5-325 MG PO TABS: 1.0000 | ORAL_TABLET | Freq: Four times a day (QID) | ORAL | 0 refills | Status: AC | PRN

## 2018-09-22 MED ORDER — ACETAMINOPHEN 325 MG PO TABS: 650.0000 mg | ORAL_TABLET | Freq: Four times a day (QID) | ORAL | Status: AC | PRN

## 2018-09-22 MED ORDER — APIXABAN 5 MG PO TABS
10.0000 mg | ORAL_TABLET | Freq: Once | ORAL | Status: AC
  Administered 2018-09-22: 10 mg via ORAL
  Filled 2018-09-22: qty 2

## 2018-09-22 NOTE — Progress Notes (Signed)
ANTICOAGULATION CONSULT NOTE - Preliminary  Pharmacy Consult for apixaban Indication: pulmonary embolus  No Known Allergies  Patient Measurements: Height: 5' 2.5" (158.8 cm) Weight: 142 lb (64.4 kg) IBW/kg (Calculated) : 51.25 HEPARIN DW (KG): 64.2   Vital Signs: Temp: 98.3 F (36.8 C) (02/02 0549) Temp Source: Oral (02/02 0549) BP: 90/57 (02/02 0549) Pulse Rate: 57 (02/02 0549)  Labs: Recent Labs    09/21/18 1033 09/21/18 1346 09/21/18 1438 09/21/18 2212 09/22/18 0449 09/22/18 0450  HGB 12.6  --   --   --  10.5*  --   HCT 40.3  --   --   --  33.4*  --   PLT 252  --   --   --  223  --   APTT  --  30  --   --   --   --   HEPARINUNFRC  --   --  0.64 0.71*  --  0.68  CREATININE 1.39*  --   --   --  1.40*  --   TROPONINI <0.03  --   --   --   --   --    Estimated Creatinine Clearance: 29.5 mL/min (A) (by C-G formula based on SCr of 1.4 mg/dL (H)).  Medical History: Past Medical History:  Diagnosis Date  . Abnormal Pap smear    Previous colposcopy  . Arthritis    hip   . Asthma    childhood    . Breast cancer ([place]) 2000   Status post left lumpectomy  . COPD (chronic obstructive pulmonary disease) ([place])   . Dyspnea    on exertion ; improved with use Spiriva   . Headache    occ  . Hyperlipidemia   . Hypertension   . Personal history of radiation therapy 2000    Medications:  Scheduled:  . apixaban  10 mg Oral Once  . atorvastatin  10 mg Oral q1800  . mometasone-formoterol  2 puff Inhalation BID  . umeclidinium bromide  1 puff Inhalation Daily   Infusions:  . sodium chloride 60 mL/hr at 09/22/18 1017    Assessment: Pharmacy consulted to dose heparin for ths 78 yo female with acute PE. D-Dimer is elevated and CTA of chest shows acute segmental PE on right side. Has been on heparin gtt and now transitioning to apixaban.      Plan:  1. Discontinue heparin gtt 2. Apixaban 10mg  x 1 Preliminary review of pertinent patient information completed.  Forestine Na clinical pharmacist will complete review during morning rounds to assess the patient and finalize treatment regimen.  Avah Bashor, Magdalene Molly, RPH 09/22/2018,7:43 AM

## 2018-09-22 NOTE — Progress Notes (Signed)
*  PRELIMINARY RESULTS* Echocardiogram 2D Echocardiogram has been performed.  Angela Parrish 09/22/2018, 8:41 AM

## 2018-09-22 NOTE — Discharge Summary (Signed)
Physician Discharge Summary  Angela Parrish JSH:[id number] DOB: 23-Aug-1940 DOA: 09/21/2018  PCP: Terald Sleeper, PA-C  Admit date: 09/21/2018 Discharge date: 09/22/2018  Admitted From: Home  Disposition: Home   Recommendations for Outpatient Follow-up:  1. Follow up with PCP in 2 weeks as scheduled for 10/07/18 2. Please follow up on the following pending results: 2D Echocardiogram   Discharge Condition: STABLE   CODE STATUS: FULL    Brief Hospitalization Summary: Please see all hospital notes, images, labs for full details of the hospitalization. HPI: Angela Parrish is a 78 y.o. female who had hip repair surgery 2 months ago presented to ED for persistent ongoing pain in RUQ that radiates to the back and has been present for the last week.  Pt says that she went to see her PCP earlier in the week and had an [place] of RUQ that was unremarkable. She has no n/v/d.   ED Course:  Pt was noted to have an elevated D dimer and was sent for CTA chest with findings of acute segmental PE on the right.  Her vital signs have remained stable.  She is oxygenating well and no signs of right heart strain. She is being started on IV heparin and admission is requested.    The patient was admitted and started on IV heparin infusion for treatment of acute pulmonary embolus.  The patient remained hemodynamically stable and was monitored on the telemetry unit.  On 09/22/2018 the patient was transitioned to oral apixaban.  The patient was dosed by the Pharm.D. who recommended 10 mg twice daily for 14 doses followed by 5 mg twice daily.  The patient reports that she has taken apixaban in the past.  The patient had no bleeding complications.  The patient did have a 2D echocardiogram but results are pending at the time of discharge.  The patient was given instructions to follow-up the results when she follows up with her primary care physician which is scheduled for 10/07/2018.  The patient did not have any signs or findings of right  heart strain on her CTA chest.  The patient did not require supplemental oxygen.  The patient had ultrasound Doppler studies done of the lower extremities and was positive for DVT findings in the right lower extremity.  The patient was placed on TED hose.  Leukocytosis resolved.  No signs or symptoms of infection were found.  CKD remained stable.  The patient had low normal blood pressures and HCTZ was discontinued.  The patient was given instructions to follow-up with primary care provider as scheduled on 10/07/2018.  The patient was counseled by the Pharm.D. regarding apixaban.  The patient was given a coupon and prescription for a starter pack for apixaban at discharge.   Discharge Diagnoses:  Principal Problem:   Acute pulmonary embolism (HCC) Active Problems:   Essential hypertension   Asthma, chronic   Chronic bronchitis (HCC)   Hyperlipidemia   Positive D dimer   CKD (chronic kidney disease) stage 3, GFR 30-59 ml/min (HCC)   Right leg DVT (HCC)   Pleuritic chest pain   Discharge Instructions: Discharge Instructions    Call MD for:  difficulty breathing, headache or visual disturbances   Complete by:  As directed    Call MD for:  extreme fatigue   Complete by:  As directed    Call MD for:  persistant dizziness or light-headedness   Complete by:  As directed    Call MD for:  severe uncontrolled pain  Complete by:  As directed    Increase activity slowly   Complete by:  As directed      Allergies as of 09/22/2018   No Known Allergies     Medication List    STOP taking these medications   hydrochlorothiazide 25 MG tablet Commonly known as:  HYDRODIURIL     TAKE these medications   acetaminophen 325 MG tablet Commonly known as:  TYLENOL Take 2 tablets (650 mg total) by mouth every 6 (six) hours as needed for mild pain (or Fever >/= 101).   albuterol (2.5 MG/3ML) 0.083% nebulizer solution Commonly known as:  PROVENTIL Take 3 mLs (2.5 mg total) by nebulization every 6  (six) hours as needed for wheezing or shortness of breath.   albuterol 108 (90 Base) MCG/ACT inhaler Commonly known as:  PROVENTIL HFA;VENTOLIN HFA Inhale 2 puffs into the lungs every 6 (six) hours as needed for wheezing or shortness of breath. Prefers VENTOLIN   atorvastatin 10 MG tablet Commonly known as:  LIPITOR TAKE ONE (1) TABLET EACH DAY   budesonide-formoterol 160-4.5 MCG/ACT inhaler Commonly known as:  SYMBICORT Inhale 2 puffs into the lungs 2 (two) times daily.   ELIQUIS STARTER PACK 5 MG Tabs Take as directed on package: start with two-5mg  tablets twice daily for 7 days. On day 8, switch to one-5mg  tablet twice daily.   HYDROcodone-acetaminophen 5-325 MG tablet Commonly known as:  NORCO/VICODIN Take 1 tablet by mouth every 6 (six) hours as needed for up to 3 days for severe pain.   tiotropium 18 MCG inhalation capsule Commonly known as:  SPIRIVA Place 1 capsule (18 mcg total) into inhaler and inhale daily.      Follow-up Information    Terald Sleeper, PA-C. Schedule an appointment as soon as possible for a visit in 2 week(s).   Specialties:  Librarian, academic, Family Medicine Why:  Hospital Follow Up  Contact information: [address] [place] [address] [phone]          No Known Allergies Allergies as of 09/22/2018   No Known Allergies     Medication List    STOP taking these medications   hydrochlorothiazide 25 MG tablet Commonly known as:  HYDRODIURIL     TAKE these medications   acetaminophen 325 MG tablet Commonly known as:  TYLENOL Take 2 tablets (650 mg total) by mouth every 6 (six) hours as needed for mild pain (or Fever >/= 101).   albuterol (2.5 MG/3ML) 0.083% nebulizer solution Commonly known as:  PROVENTIL Take 3 mLs (2.5 mg total) by nebulization every 6 (six) hours as needed for wheezing or shortness of breath.   albuterol 108 (90 Base) MCG/ACT inhaler Commonly known as:  PROVENTIL HFA;VENTOLIN HFA Inhale 2 puffs into the  lungs every 6 (six) hours as needed for wheezing or shortness of breath. Prefers VENTOLIN   atorvastatin 10 MG tablet Commonly known as:  LIPITOR TAKE ONE (1) TABLET EACH DAY   budesonide-formoterol 160-4.5 MCG/ACT inhaler Commonly known as:  SYMBICORT Inhale 2 puffs into the lungs 2 (two) times daily.   ELIQUIS STARTER PACK 5 MG Tabs Take as directed on package: start with two-5mg  tablets twice daily for 7 days. On day 8, switch to one-5mg  tablet twice daily.   HYDROcodone-acetaminophen 5-325 MG tablet Commonly known as:  NORCO/VICODIN Take 1 tablet by mouth every 6 (six) hours as needed for up to 3 days for severe pain.   tiotropium 18 MCG inhalation capsule Commonly known as:  SPIRIVA  Place 1 capsule (18 mcg total) into inhaler and inhale daily.       Procedures/Studies: Dg Chest 2 View  Result Date: 09/21/2018 CLINICAL DATA:  Cough, shortness of breath. EXAM: CHEST - 2 VIEW COMPARISON:  Radiographs of Dec 23, 2014. FINDINGS: The heart size and mediastinal contours are within normal limits. No pneumothorax or pleural effusion is noted. Atherosclerosis of thoracic aorta is noted. Biapical scarring is noted. Left basilar scarring is noted. No acute pulmonary abnormality is noted. The visualized skeletal structures are unremarkable. IMPRESSION: No active cardiopulmonary disease. Aortic Atherosclerosis (ICD10-I70.0). Electronically Signed   By: Marijo Conception, M.D.   On: 09/21/2018 12:13   Ct Angio Chest Pe W And/or Wo Contrast  Result Date: 09/21/2018 CLINICAL DATA:  Chest pain. EXAM: CT ANGIOGRAPHY CHEST WITH CONTRAST TECHNIQUE: Multidetector CT imaging of the chest was performed using the standard protocol during bolus administration of intravenous contrast. Multiplanar CT image reconstructions and MIPs were obtained to evaluate the vascular anatomy. CONTRAST:  53mL ISOVUE-370 IOPAMIDOL (ISOVUE-370) INJECTION 76% COMPARISON:  Radiographs of same day. FINDINGS: Cardiovascular:  Atherosclerosis of thoracic aorta is noted without aneurysm or dissection. Filling defects are noted in lower lobe branches of right pulmonary artery consistent with acute pulmonary emboli. RV/LV ratio of 0.77 is noted which is within normal limits. Normal cardiac size. No pericardial effusion is noted. Mediastinum/Nodes: No enlarged mediastinal, hilar, or axillary lymph nodes. Thyroid gland, trachea, and esophagus demonstrate no significant findings. Lungs/Pleura: No pneumothorax or pleural effusion is noted. Mild biapical scarring is noted. No acute pulmonary disease is noted. Upper Abdomen: No acute abnormality. Musculoskeletal: No chest wall abnormality. No acute or significant osseous findings. Review of the MIP images confirms the above findings. IMPRESSION: Acute pulmonary emboli are noted in lower lobe branches of right pulmonary artery. Critical Value/emergent results were called by telephone at the time of interpretation on 09/21/2018 at 1:28 pm to Dr. Noemi Chapel , who verbally acknowledged these results. Aortic Atherosclerosis (ICD10-I70.0). Electronically Signed   By: Marijo Conception, M.D.   On: 09/21/2018 13:28   US Venous Img Lower Bilateral  Result Date: 09/22/2018 CLINICAL DATA:  History of pulmonary embolus. EXAM: BILATERAL LOWER EXTREMITY VENOUS DOPPLER ULTRASOUND TECHNIQUE: Gray-scale sonography with graded compression, as well as color Doppler and duplex ultrasound were performed to evaluate the lower extremity deep venous systems from the level of the common femoral vein and including the common femoral, femoral, profunda femoral, popliteal and calf veins including the posterior tibial, peroneal and gastrocnemius veins when visible. The superficial great saphenous vein was also interrogated. Spectral Doppler was utilized to evaluate flow at rest and with distal augmentation maneuvers in the common femoral, femoral and popliteal veins. COMPARISON:  CT scan of September 21, 2018. FINDINGS: RIGHT  LOWER EXTREMITY Common Femoral Vein: Partial compressibility of the right common femoral vein is noted with some flow present consistent with nonocclusive thrombus. Saphenofemoral Junction: No evidence of thrombus. Normal compressibility and flow on color Doppler imaging. Profunda Femoral Vein: Partial compressibility of the right profunda femoral vein is noted with some flow present consistent with nonocclusive thrombus. Femoral Vein: No evidence of thrombus. Normal compressibility, respiratory phasicity and response to augmentation. Popliteal Vein: No evidence of thrombus. Normal compressibility, respiratory phasicity and response to augmentation. Calf Veins: No evidence of thrombus. Normal compressibility and flow on color Doppler imaging. Superficial Great Saphenous Vein: No evidence of thrombus. Normal compressibility. Venous Reflux:  None. Other Findings:  None. LEFT LOWER EXTREMITY Common Femoral Vein: No evidence  of thrombus. Normal compressibility, respiratory phasicity and response to augmentation. Saphenofemoral Junction: No evidence of thrombus. Normal compressibility and flow on color Doppler imaging. Profunda Femoral Vein: No evidence of thrombus. Normal compressibility and flow on color Doppler imaging. Femoral Vein: No evidence of thrombus. Normal compressibility, respiratory phasicity and response to augmentation. Popliteal Vein: No evidence of thrombus. Normal compressibility, respiratory phasicity and response to augmentation. Calf Veins: No evidence of thrombus. Normal compressibility and flow on color Doppler imaging. Superficial Great Saphenous Vein: No evidence of thrombus. Normal compressibility. Venous Reflux:  None. Other Findings:  None. IMPRESSION: Nonocclusive deep venous thrombosis is noted in the right common and profunda femoral veins. No evidence of deep venous thrombosis seen in the left lower extremity. Electronically Signed   By: Marijo Conception, M.D.   On: 09/22/2018 10:03   US  Abdomen Limited Ruq  Result Date: 09/18/2018 CLINICAL DATA:  Right upper quadrant abdominal pain. EXAM: ULTRASOUND ABDOMEN LIMITED RIGHT UPPER QUADRANT COMPARISON:  None. FINDINGS: Gallbladder: No gallstones or wall thickening visualized. No sonographic Murphy sign noted by sonographer. Common bile duct: Diameter: 2.5 mm Liver: No focal lesion identified. Within normal limits in parenchymal echogenicity. Portal vein is patent on color Doppler imaging with normal direction of blood flow towards the liver. IMPRESSION: Normal right upper quadrant ultrasound examination. Electronically Signed   By: Marijo Sanes M.D.   On: 09/18/2018 09:38      Subjective: Pt without complaints, says that her pleuritic chest pain is a lot better today.  She has been ambulating without difficulty.   Discharge Exam: Vitals:   09/22/18 0959 09/22/18 1301  BP:  (!) 123/42  Pulse:  76  Resp:  17  Temp:  99.2 F (37.3 C)  SpO2: 97% 98%   Vitals:   09/21/18 2257 09/22/18 0549 09/22/18 0959 09/22/18 1301  BP: (!) 112/56 (!) 90/57  (!) 123/42  Pulse: 75 (!) 57  76  Resp: 15 16  17   Temp: 98.7 F (37.1 C) 98.3 F (36.8 C)  99.2 F (37.3 C)  TempSrc: Oral Oral  Oral  SpO2: 95% 97% 97% 98%  Weight:      Height:       General: Pt is alert, awake, not in acute distress Cardiovascular: RRR, S1/S2 +, no rubs, no gallops Respiratory: CTA bilaterally, no wheezing, no rhonchi Abdominal: Soft, NT, ND, bowel sounds + Extremities: no edema, no cyanosis   The results of significant diagnostics from this hospitalization (including imaging, microbiology, ancillary and laboratory) are listed below for reference.    Microbiology: No results found for this or any previous visit (from the past 240 hour(s)).   Labs: BNP (last 3 results) No results for input(s): BNP in the last 8760 hours. Basic Metabolic Panel: Recent Labs  Lab 09/21/18 1033 09/22/18 0449  NA 136 134*  K 4.7 4.2  CL 99 102  CO2 25 23  GLUCOSE  121* 124*  BUN 27* 27*  CREATININE 1.39* 1.40*  CALCIUM 9.6 8.6*  MG  --  2.0   Liver Function Tests: Recent Labs  Lab 09/21/18 1033 09/22/18 0449  AST 19 15  ALT 14 10  ALKPHOS 91 70  BILITOT 1.3* 0.4  PROT 8.2* 6.5  ALBUMIN 4.1 3.1*   No results for input(s): LIPASE, AMYLASE in the last 168 hours. No results for input(s): AMMONIA in the last 168 hours. CBC: Recent Labs  Lab 09/17/18 1240 09/21/18 1033 09/22/18 0449  WBC 8.9 12.0* 9.7  NEUTROABS 5.6 8.7*  6.2  HGB 12.5 12.6 10.5*  HCT 36.7 40.3 33.4*  MCV 92 98.1 98.8  PLT 233 252 223   Cardiac Enzymes: Recent Labs  Lab 09/21/18 1033  TROPONINI <0.03   BNP: Invalid input(s): POCBNP CBG: No results for input(s): GLUCAP in the last 168 hours. D-Dimer Recent Labs    09/21/18 1033  DDIMER 4.52*   Hgb A1c No results for input(s): HGBA1C in the last 72 hours. Lipid Profile No results for input(s): CHOL, HDL, LDLCALC, TRIG, CHOLHDL, LDLDIRECT in the last 72 hours. Thyroid function studies No results for input(s): TSH, T4TOTAL, T3FREE, THYROIDAB in the last 72 hours.  Invalid input(s): FREET3 Anemia work up No results for input(s): VITAMINB12, FOLATE, FERRITIN, TIBC, IRON, RETICCTPCT in the last 72 hours. Urinalysis No results found for: COLORURINE, APPEARANCEUR, LABSPEC, [place], GLUCOSEU, HGBUR, BILIRUBINUR, KETONESUR, PROTEINUR, UROBILINOGEN, NITRITE, LEUKOCYTESUR Sepsis Labs Invalid input(s): PROCALCITONIN,  WBC,  LACTICIDVEN Microbiology No results found for this or any previous visit (from the past 240 hour(s)).  Time coordinating discharge:   SIGNED:  Irwin Brakeman, MD  [facility] 09/22/2018, 1:59 PM How to contact the [facility] Attending or Consulting provider [place] or covering provider during after hours [place], for this patient?  1. Check the care team in [facility] and look for a) attending/consulting [facility] provider listed and b) the [facility] team listed 2. Log into [email or web address] and use [facility]'s universal password to access. If you do not have the password, please contact the hospital operator. 3. Locate the [facility] provider you are looking for under [facility] and page to a number that you can be directly reached. 4. If you still have difficulty reaching the provider, please page the [facility] (Director on Call) for the Hospitalists listed on amion for assistance.

## 2018-09-22 NOTE — Discharge Instructions (Signed)
Please get your echo results when you see your primary care provider.     Bleeding Precautions When on Anticoagulant Therapy, Adult Anticoagulant therapy, also called blood thinner therapy, is medicine that helps to prevent and treat blood clots. The medicine works by stopping blood clots from forming or growing. Blood clots that form in your blood vessels can be dangerous. They can break loose and travel to the heart, lungs, or brain. This increases the risk of a heart attack, stroke, or blocked lung artery (pulmonary embolism). Anticoagulants also increase the risk of bleeding. Try to protect yourself from cuts and other injuries that can cause bleeding. It is important to take anticoagulants exactly as told by your health care provider. Why do I need to be on anticoagulant therapy? You may need this medicine if you are at risk of developing a blood clot. Conditions that increase your risk of a blood clot include:  Being born with heart disease or a heart malformation (congenital heart disease).  Developing heart disease.  Having had surgery, such as valve replacement.  Having had a serious accident or other type of severe injury (trauma).  Having certain types of cancer.  Having certain diseases that can increase blood clotting.  Having a high risk of stroke or heart attack.  Having atrial fibrillation (AF). What are the common anticoagulant medicines? There are several types of anticoagulant medicines. The most common types are:  Medicines that you take by mouth (oral medicines), such as: ? Warfarin. ? Novel oral anticoagulants (NOACs), such as: ? Direct thrombin inhibitors (dabigatran). ? Factor Xa inhibitors (apixaban, edoxaban, and rivaroxaban).  Injections, such as: ? Unfractionated heparin. ? Low molecular weight heparin. These anticoagulants work in different ways to prevent blood clots. They also have different risks and side effects. What do I need to remember while  on anticoagulant therapy? Taking anticoagulants  Take your medicine at the same time every day. If you forget to take your medicine, take it as soon as you remember. Do not double your dosage of medicine if you miss a whole day. Take your normal dose and call your health care provider.  Do not stop taking your medicine unless your health care provider approves. Stopping the medicine can increase your risk of developing a blood clot. Taking other medicines  Take over-the-counter and prescriptions medicines only as told by your health care provider.  Do not take over-the-counter NSAIDs, including aspirin and ibuprofen, while you are on anticoagulant therapy. These medicines increase your risk of dangerous bleeding.  Get approval from your health care provider before you start taking any new medicines, vitamins, or herbal products. Some of these could interfere with your therapy. General instructions  Keep all follow-up visits as told by your health care provider. This is important.  If you are pregnant or trying to get pregnant, talk with a health care provider about anticoagulants. Some of these medicines are not safe to take during pregnancy.  Tell all health care providers, including your dentist, that you are on anticoagulant therapy. It is especially important to tell providers before you have any surgery, medical procedures, or dental work done. What precautions should I take?   Be very careful when using knives, scissors, or other sharp objects.  Use an electric razor instead of a blade.  Do not use toothpicks.  Use a soft-bristled toothbrush. Brush your teeth gently.  Always wear shoes outdoors and wear slippers indoors.  Be careful when cutting your fingernails and toenails.  Place bath  mats in the bathroom. If possible, install handrails as well.  Wear gloves while you do yard work.  Wear your seat belt.  Prevent falls by removing loose rugs and extension cords from  areas where you walk. Use a cane or walker if you need it.  Avoid constipation by: ? Drinking enough fluid to keep your urine clear or pale yellow. ? Eating foods that are high in fiber, such as fresh fruits and vegetables, whole grains, and beans. ? Limiting foods that are high in fat and processed sugars, such as fried and sweet foods.  Do not play contact sports or participate in other activities that have a high risk for injury. What other precautions are important if on warfarin therapy? If you are taking a type of anticoagulant called warfarin, make sure you:  Work with a diet and nutrition specialist (dietitian) to make an eating plan. Do not make any sudden changes to your diet after you have started your eating plan.  Do not drink alcohol. It can interfere with your medicine and increase your risk of an injury that causes bleeding.  Get regular blood tests as told by your health care provider. What are some questions to ask my health care provider?  Why do I need anticoagulant therapy?  What is the best anticoagulant therapy for my condition?  How long will I need anticoagulant therapy?  What are the side effects of anticoagulant therapy?  When should I take my medicine? What should I do if I forget to take it?  Will I need to have regular blood tests?  Do I need to change my diet? Are there foods or drinks that I should avoid?  What activities are safe for me?  What should I do if I want to get pregnant? Contact a health care provider if:  You miss a dose of medicine: ? And you are not sure what to do. ? For more than one day.  You have: ? Menstrual bleeding that is heavier than normal. ? Bloody or brown urine. ? Easy bruising. ? Black and tarry stool or bright red stool. ? Side effects from your medicine.  You feel weak or dizzy.  You become pregnant. Get help right away if:  You have bleeding that will not stop within 20 minutes from: ? The  nose. ? The gums. ? A cut on the skin.  You have a severe headache or stomachache.  You vomit or cough up blood.  You fall or hit your head. Summary  Anticoagulant therapy, also called blood thinner therapy, is medicine that helps to prevent and treat blood clots.  Anticoagulants work in different ways to prevent blood clots. They also have different risks and side effects.  Talk with your health care provider about any precautions that you should take while on anticoagulant therapy. This information is not intended to replace advice given to you by your health care provider. Make sure you discuss any questions you have with your health care provider. Document Released: 07/19/2015 Document Revised: 10/24/2016 Document Reviewed: 10/24/2016 Elsevier Interactive Patient Education  2019 [employer].   Deep Vein Thrombosis  Deep vein thrombosis (DVT) is a condition in which a blood clot forms in a deep vein, such as a lower leg, thigh, or arm vein. A clot is blood that has thickened into a gel or solid. This condition is dangerous. It can lead to serious and even life-threatening complications if the clot travels to the lungs and causes a blockage (  pulmonary embolism). It can also damage veins in the leg. This can result in leg pain, swelling, discoloration, and sores (post-thrombotic syndrome). What are the causes? This condition may be caused by:  A slowdown of blood flow.  Damage to a vein.  A condition that causes blood to clot more easily, such as an inherited clotting disorder. What increases the risk? The following factors may make you more likely to develop this condition:  Being overweight.  Being older, especially over age 62.  Sitting or lying down for more than four hours.  Being in the hospital.  Lack of physical activity (sedentary lifestyle).  Pregnancy, being in childbirth, or having recently given birth.  Taking medicines that contain estrogen, such as  medicines to prevent pregnancy.  Smoking.  A history of any of the following: ? Blood clots or a blood clotting disease. ? Peripheral vascular disease. ? Inflammatory bowel disease. ? Cancer. ? Heart disease. ? Genetic conditions that affect how your blood clots, such as Factor V Leiden mutation. ? Neurological diseases that affect your legs (leg paresis). ? A recent injury, such as a car accident. ? Major or lengthy surgery. ? A central line placed inside a large vein. What are the signs or symptoms? Symptoms of this condition include:  Swelling, pain, or tenderness in an arm or leg.  Warmth, redness, or discoloration in an arm or leg. If the clot is in your leg, symptoms may be more noticeable or worse when you stand or walk. Some people may not develop any symptoms. How is this diagnosed? This condition is diagnosed with:  A medical history and physical exam.  Tests, such as: ? Blood tests. These are done to check how well your blood clots. ? Ultrasound. This is done to check for clots. ? Venogram. For this test, contrast dye is injected into a vein and X-rays are taken to check for any clots. How is this treated? Treatment for this condition depends on:  The cause of your DVT.  Your risk for bleeding or developing more clots.  Any other medical conditions that you have. Treatment may include:  Taking a blood thinner (anticoagulant). This type of medicine prevents clots from forming. It may be taken by mouth, injected under the skin, or injected through an IV (catheter).  Injecting clot-dissolving medicines into the affected vein (catheter-directed thrombolysis).  Having surgery. Surgery may be done to: ? Remove the clot. ? Place a filter in a large vein to catch blood clots before they reach the lungs. Some treatments may be continued for up to six months. Follow these instructions at home: If you are taking blood thinners:  Take the medicine exactly as told by  your health care provider. Some blood thinners need to be taken at the same time every day. Do not skip a dose.  Talk with your health care provider before you take any medicines that contain aspirin or NSAIDs. These medicines increase your risk for dangerous bleeding.  Ask your health care provider about foods and drugs that could change the way the medicine works (may interact). Avoid those things if your health care provider tells you to do so.  Blood thinners can cause easy bruising and may make it difficult to stop bleeding. Because of this: ? Be very careful when using knives, scissors, or other sharp objects. ? Use an electric razor instead of a blade. ? Avoid activities that could cause injury or bruising, and follow instructions about how to prevent falls.  Wear a medical alert bracelet or carry a card that lists what medicines you take. General instructions  Take over-the-counter and prescription medicines only as told by your health care provider.  Return to your normal activities as told by your health care provider. Ask your health care provider what activities are safe for you.  Wear compression stockings if recommended by your health care provider.  Keep all follow-up visits as told by your health care provider. This is important. How is this prevented? To lower your risk of developing this condition again:  For 30 or more minutes every day, do an activity that: ? Involves moving your arms and legs. ? Increases your heart rate.  When traveling for longer than four hours: ? Exercise your arms and legs every hour. ? Drink plenty of water. ? Avoid drinking alcohol.  Avoid sitting or lying for a long time without moving your legs.  If you have surgery or you are hospitalized, ask about ways to prevent blood clots. These may include taking frequent walks or using anticoagulants.  Stay at a healthy weight.  If you are a woman who is older than age 2, avoid unnecessary  use of medicines that contain estrogen, such as some birth control pills.  Do not use any products that contain nicotine or tobacco, such as cigarettes and e-cigarettes. This is especially important if you take estrogen medicines. If you need help quitting, ask your health care provider. Contact a health care provider if:  You miss a dose of your blood thinner.  Your menstrual period is heavier than usual.  You have unusual bruising. Get help right away if:  You have: ? New or increased pain, swelling, or redness in an arm or leg. ? Numbness or tingling in an arm or leg. ? Shortness of breath. ? Chest pain. ? A rapid or irregular heartbeat. ? A severe headache or confusion. ? A cut that will not stop bleeding.  There is blood in your vomit, stool, or urine.  You have a serious fall or accident, or you hit your head.  You feel light-headed or dizzy.  You cough up blood. These symptoms may represent a serious problem that is an emergency. Do not wait to see if the symptoms will go away. Get medical help right away. Call your local emergency services (911 in the [place] Do not drive yourself to the hospital. Summary  Deep vein thrombosis (DVT) is a condition in which a blood clot forms in a deep vein, such as a lower leg, thigh, or arm vein.  Symptoms can include swelling, warmth, pain, and redness in your leg or arm.  This condition may be treated with a blood thinner (anticoagulant medicine), medicine that is injected to dissolve blood clots,compression stockings, or surgery.  If you are prescribed blood thinners, take them exactly as told. This information is not intended to replace advice given to you by your health care provider. Make sure you discuss any questions you have with your health care provider. Document Released: 08/07/2005 Document Revised: 01/05/2017 Document Reviewed: 01/05/2017 Elsevier Interactive Patient Education  2019 [employer]   Pulmonary  Embolism  A pulmonary embolism (PE) is a sudden blockage or decrease of blood flow in one lung or both lungs. Most blockages come from a blood clot that forms in a lower leg, thigh, or arm vein (deep vein thrombosis, DVT) and travels to the lungs. A clot is blood that has thickened into a gel or solid. PE is a  dangerous and life-threatening condition that needs to be treated right away. What are the causes? This condition is usually caused by a blood clot that forms in a vein and moves to the lungs. In rare cases, it may be caused by air, fat, part of a tumor, or other tissue that moves through the veins and into the lungs. What increases the risk? The following factors may make you more likely to develop this condition:  Traumatic injury, such as breaking a hip or leg.  Spinal cord injury.  Orthopedic surgery, especially hip or knee replacement.  Any major surgery.  Stroke.  Having DVT.  Blood clots or blood clotting disease.  Long-term (chronic) lung or heart disease.  Taking medicines that contain estrogen. These include birth control pills and hormone replacement therapy.  Cancer and chemotherapy.  Having a central venous catheter.  Pregnancy and the period of time after delivery (postpartum).  Being older than age 32.  Being overweight.  Smoking. What are the signs or symptoms? Symptoms of this condition usually start suddenly and include:  Shortness of breath during activity or at rest.  Coughing or coughing up blood or blood-tinged mucus.  Chest pain that is often worse with deep breaths.  Rapid or irregular heartbeat.  Feeling light-headed or dizzy.  Fainting.  Feeling anxious.  Fever.  Sweating.  Pain and swelling in a leg. This is a symptom of DVT, which can lead to PE. How is this diagnosed? This condition may be diagnosed based on:  Your medical history.  A physical exam.  Blood tests.  CT pulmonary angiogram. This test checks blood flow  in and around your lungs.  Ventilation-perfusion scan, also called a lung VQ scan. This test measures air flow and blood flow to the lungs.  Ultrasound of the legs. How is this treated? Treatment for this condition depends on many factors, such as the cause of your PE, your risk for bleeding or developing more clots, and other medical conditions you have. Treatment aims to remove, dissolve, or stop blood clots from forming or growing larger. Treatment may include:  Medicines, such as: ? Blood thinning medicines (anticoagulants) to stop clots from forming or growing. ? Medicines that dissolve clots (thrombolytics).  Procedures, such as: ? Using a flexible tube to remove a blood clot (embolectomy) or deliver medicine to destroy it (catheter-directed thrombolysis). ? Inserting a filter into a large vein that carries blood to the heart (inferior vena cava). This filter (vena cava filter) catches blood clots before they reach the lungs. ? Surgery to remove the clot (surgical embolectomy). This is rare. You may need a combination of immediate, long-term (up to 3 months after diagnosis), and extended (more than 3 months after diagnosis) treatments. Your treatment may continue for several months (maintenance therapy). You and your health care provider will work together to choose the treatment program that is best for you. Follow these instructions at home: Medicines  Take over-the-counter and prescription medicines only as told by your health care provider.  If you are taking an anticoagulant medicine: ? Take the medicine every day at the same time each day. ? Understand what foods and drugs interact with your medicine. ? Understand the side effects of this medicine, including excessive bruising or bleeding. Ask your health care provider or pharmacist about other side effects. General instructions  Wear a medical alert bracelet or carry a medical alert card that says you have had a PE and lists  what medicines you take.  Ask  your health care provider when you may return to your normal activities. Avoid sitting or lying for a long time without moving.  Maintain a healthy weight. Ask your health care provider what weight is healthy for you.  Do not use any products that contain nicotine or tobacco, such as cigarettes and e-cigarettes. If you need help quitting, ask your health care provider.  Talk with your health care provider about any travel plans. It is important to make sure that you are still able to take your medicine while on trips.  Keep all follow-up visits as told by your health care provider. This is important. Contact a health care provider if:  You missed a dose of your blood thinner medicine. Get help right away if:  You have: ? New or increased pain, swelling, warmth, or redness in an arm or leg. ? Numbness or tingling in an arm or leg. ? Shortness of breath during activity or at rest. ? A fever. ? Chest pain. ? A rapid or irregular heartbeat. ? A severe headache. ? Vision changes. ? A serious fall or accident, or you hit your head. ? Stomach (abdominal) pain. ? Blood in your vomit, stool, or urine. ? A cut that will not stop bleeding.  You cough up blood.  You feel light-headed or dizzy.  You cannot move your arms or legs.  You are confused or have memory loss. These symptoms may represent a serious problem that is an emergency. Do not wait to see if the symptoms will go away. Get medical help right away. Call your local emergency services (911 in the [place] Do not drive yourself to the hospital. Summary  A pulmonary embolism (PE) is a sudden blockage or decrease of blood flow in one lung or both lungs. PE is a dangerous and life-threatening condition that needs to be treated right away.  Treatments for this condition usually include medicines to thin your blood (anticoagulants) or medicines to break apart blood clots (thrombolytics).  If you are  given blood thinners, it is important to take the medicine every single day at the same time each day.  If you have signs of PE or DVT, call your local emergency services (911 in the [place] This information is not intended to replace advice given to you by your health care provider. Make sure you discuss any questions you have with your health care provider. Document Released: 08/04/2000 Document Revised: 03/22/2018 Document Reviewed: 09/20/2017 Elsevier Interactive Patient Education  2019 [employer]   IMPORTANT INFORMATION: PAY CLOSE ATTENTION   PHYSICIAN DISCHARGE INSTRUCTIONS  Follow with Primary care provider  Terald Sleeper, PA-C  and other consultants as instructed your Hospitalist Physician  SEEK MEDICAL CARE OR RETURN TO EMERGENCY ROOM IF SYMPTOMS COME BACK, WORSEN OR NEW PROBLEM DEVELOPS.   Please note: You were cared for by a hospitalist during your hospital stay. Every effort will be made to forward records to your primary care provider.  You can request that your primary care provider send for your hospital records if they have not received them.  Once you are discharged, your primary care physician will handle any further medical issues. Please note that NO REFILLS for any discharge medications will be authorized once you are discharged, as it is imperative that you return to your primary care physician (or establish a relationship with a primary care physician if you do not have one) for your post hospital discharge needs so that they can reassess your need for medications and  monitor your lab values.  Please get a complete blood count and chemistry panel checked by your Primary MD at your next visit, and again as instructed by your Primary MD.  Get Medicines reviewed and adjusted: Please take all your medications with you for your next visit with your Primary MD  Laboratory/radiological data: Please request your Primary MD to go over all hospital tests and  procedure/radiological results at the follow up, please ask your primary care provider to get all Hospital records sent to his/her office.  In some cases, they will be blood work, cultures and biopsy results pending at the time of your discharge. Please request that your primary care provider follow up on these results.  If you are diabetic, please bring your blood sugar readings with you to your follow up appointment with primary care.    Please call and make your follow up appointments as soon as possible.    Also Note the following: If you experience worsening of your admission symptoms, develop shortness of breath, life threatening emergency, suicidal or homicidal thoughts you must seek medical attention immediately by calling 911 or calling your MD immediately  if symptoms less severe.  You must read complete instructions/literature along with all the possible adverse reactions/side effects for all the Medicines you take and that have been prescribed to you. Take any new Medicines after you have completely understood and accpet all the possible adverse reactions/side effects.   Do not drive when taking Pain medications or sleeping medications (Benzodiazepines)  Do not take more than prescribed Pain, Sleep and Anxiety Medications. It is not advisable to combine anxiety,sleep and pain medications without talking with your primary care practitioner  Special Instructions: If you have smoked or chewed Tobacco  in the last 2 yrs please stop smoking, stop any regular Alcohol  and or any Recreational drug use.  Wear Seat belts while driving.

## 2018-09-24 ENCOUNTER — Telehealth: Payer: Self-pay | Admitting: Physician Assistant

## 2018-10-01 ENCOUNTER — Other Ambulatory Visit (HOSPITAL_COMMUNITY): Payer: Medicare HMO

## 2018-10-07 ENCOUNTER — Encounter: Payer: Self-pay | Admitting: Physician Assistant

## 2018-10-07 ENCOUNTER — Ambulatory Visit (INDEPENDENT_AMBULATORY_CARE_PROVIDER_SITE_OTHER): Payer: Medicare HMO | Admitting: Physician Assistant

## 2018-10-07 VITALS — BP 116/73 | HR 80 | Temp 98.6°F | Ht 62.5 in | Wt 144.2 lb

## 2018-10-07 DIAGNOSIS — E785 Hyperlipidemia, unspecified: Secondary | ICD-10-CM | POA: Diagnosis not present

## 2018-10-07 DIAGNOSIS — I2699 Other pulmonary embolism without acute cor pulmonale: Secondary | ICD-10-CM

## 2018-10-07 DIAGNOSIS — I824Z1 Acute embolism and thrombosis of unspecified deep veins of right distal lower extremity: Secondary | ICD-10-CM

## 2018-10-08 LAB — CMP14+EGFR
ALT: 13 IU/L (ref 0–32)
AST: 18 IU/L (ref 0–40)
Albumin/Globulin Ratio: 1.5 (ref 1.2–2.2)
Albumin: 4.2 g/dL (ref 3.7–4.7)
Alkaline Phosphatase: 104 IU/L (ref 39–117)
BUN/Creatinine Ratio: 18 (ref 12–28)
BUN: 24 mg/dL (ref 8–27)
Bilirubin Total: 0.3 mg/dL (ref 0.0–1.2)
CO2: 23 mmol/L (ref 20–29)
Calcium: 10 mg/dL (ref 8.7–10.3)
Chloride: 103 mmol/L (ref 96–106)
Creatinine, Ser: 1.35 mg/dL — ABNORMAL HIGH (ref 0.57–1.00)
GFR calc Af Amer: 43 mL/min/{1.73_m2} — ABNORMAL LOW (ref >59)
GFR calc non Af Amer: 38 mL/min/{1.73_m2} — ABNORMAL LOW (ref >59)
Globulin, Total: 2.8 g/dL (ref 1.5–4.5)
Glucose: 101 mg/dL — ABNORMAL HIGH (ref 65–99)
Potassium: 4.5 mmol/L (ref 3.5–5.2)
SODIUM: 140 mmol/L (ref 134–144)
Total Protein: 7 g/dL (ref 6.0–8.5)

## 2018-10-08 LAB — LIPID PANEL
Chol/HDL Ratio: 2.9 ratio (ref 0.0–4.4)
Cholesterol, Total: 211 mg/dL — ABNORMAL HIGH (ref 100–199)
HDL: 73 mg/dL (ref >39)
LDL Calculated: 110 mg/dL — ABNORMAL HIGH (ref 0–99)
Triglycerides: 138 mg/dL (ref 0–149)
VLDL Cholesterol Cal: 28 mg/dL (ref 5–40)

## 2018-10-09 ENCOUNTER — Encounter: Payer: Self-pay | Admitting: Physician Assistant

## 2018-10-09 NOTE — Progress Notes (Signed)
BP 116/73   Pulse 80   Temp 98.6 F (37 C) (Oral)   Ht 5' 2.5" (1.588 m)   Wt 144 lb 3.2 oz (65.4 kg)   SpO2 97%   BMI 25.95 kg/m    Subjective:    Patient ID: Angela Parrish, female    DOB: 1941/08/01, 78 y.o.   MRN: [id number]  HPI: Angela Parrish is a 78 y.o. female presenting on 10/07/2018 for Hospitalization Follow-up (PE )  This patient comes in for hospital follow-up and a 40-monthrecheck.  She was having some right upper quadrant pain and some shortness of breath.  She was first evaluated for gallbladder disease and that was negative.  However within a week it got much worse and she was having right lower leg discomfort.  She was found to have a nonocclusive thrombus and also a right lower lobe pulmonary embolism.  She was followed at the hospital and all records are reviewed.  We will have her continue her Eliquis 5 mg twice daily for at least the next 6 months and we will recheck her at that time to see how things are feeling.  And then plan for rescan.  Patient also needs a refill and check on her hyperlipidemia.  She is tolerating her meds well.  All her medications are reviewed today.  And no refills are needed.  Past Medical History:  Diagnosis Date  . Abnormal Pap smear    Previous colposcopy  . Arthritis    hip   . Asthma    childhood    . [place] [place]   Status post left lumpectomy  . COPD (chronic obstructive pulmonary disease) ([facility]   . Dyspnea    on exertion ; improved with use Spiriva   . Headache    occ  . Hyperlipidemia   . Hypertension   . Personal history of radiation therapy 2000   Relevant past medical, surgical, family and social history reviewed and updated as indicated. Interim medical history since our last visit reviewed. Allergies and medications reviewed and updated. DATA REVIEWED: CHART IN EPIC  Family History reviewed for pertinent findings.  Review of Systems  Constitutional: Negative.  Negative for activity change,  fatigue and fever.  HENT: Negative.   Eyes: Negative.   Respiratory: Negative.  Negative for cough.   Cardiovascular: Negative.  Negative for chest pain.  Gastrointestinal: Negative.  Negative for abdominal pain.  Endocrine: Negative.   Genitourinary: Negative.  Negative for dysuria.  Musculoskeletal: Negative.   Skin: Negative.   Neurological: Negative.     Allergies as of 10/07/2018   No Known Allergies     Medication List       Accurate as of October 07, 2018 11:59 PM. Always use your most recent med list.        acetaminophen 325 MG tablet Commonly known as:  TYLENOL Take 2 tablets (650 mg total) by mouth every 6 (six) hours as needed for mild pain (or Fever >/= 101).   albuterol (2.5 MG/3ML) 0.083% nebulizer solution Commonly known as:  PROVENTIL Take 3 mLs (2.5 mg total) by nebulization every 6 (six) hours as needed for wheezing or shortness of breath.   albuterol 108 (90 Base) MCG/ACT inhaler Commonly known as:  PROVENTIL HFA;VENTOLIN HFA Inhale 2 puffs into the lungs every 6 (six) hours as needed for wheezing or shortness of breath. Prefers VENTOLIN   atorvastatin 10 MG tablet Commonly known as:  LIPITOR TAKE ONE (1) TABLET EACH  DAY   budesonide-formoterol 160-4.5 MCG/ACT inhaler Commonly known as:  SYMBICORT Inhale 2 puffs into the lungs 2 (two) times daily.   ELIQUIS DVT/PE STARTER PACK 5 MG Tabs Take as directed on package: start with two-52m tablets twice daily for 7 days. On day 8, switch to one-568mtablet twice daily.   tiotropium 18 MCG inhalation capsule Commonly known as:  SPIRIVA Place 1 capsule (18 mcg total) into inhaler and inhale daily.          Objective:    BP 116/73   Pulse 80   Temp 98.6 F (37 C) (Oral)   Ht 5' 2.5" (1.588 m)   Wt 144 lb 3.2 oz (65.4 kg)   SpO2 97%   BMI 25.95 kg/m   No Known Allergies  Wt Readings from Last 3 Encounters:  10/07/18 144 lb 3.2 oz (65.4 kg)  09/21/18 142 lb (64.4 kg)  09/17/18 146 lb 12.8  oz (66.6 kg)    Physical Exam Constitutional:      Appearance: She is well-developed.  HENT:     Head: Normocephalic and atraumatic.     Right Ear: Tympanic membrane, ear canal and external ear normal.     Left Ear: Tympanic membrane, ear canal and external ear normal.     Nose: Nose normal. No rhinorrhea.     Mouth/Throat:     Pharynx: No oropharyngeal exudate or posterior oropharyngeal erythema.  Eyes:     Conjunctiva/sclera: Conjunctivae normal.     Pupils: Pupils are equal, round, and reactive to light.  Neck:     Musculoskeletal: Normal range of motion and neck supple.  Cardiovascular:     Rate and Rhythm: Normal rate and regular rhythm.     Heart sounds: Normal heart sounds.  Pulmonary:     Effort: Pulmonary effort is normal.     Breath sounds: Normal breath sounds.  Abdominal:     General: Bowel sounds are normal.     Palpations: Abdomen is soft.  Skin:    General: Skin is warm and dry.     Findings: No rash.  Neurological:     Mental Status: She is alert and oriented to person, place, and time.     Deep Tendon Reflexes: Reflexes are normal and symmetric.  Psychiatric:        Behavior: Behavior normal.        Thought Content: Thought content normal.        Judgment: Judgment normal.     Results for orders placed or performed in visit on 10/07/18  CMP14+EGFR  Result Value Ref Range   Glucose 101 (H) 65 - 99 mg/dL   BUN 24 8 - 27 mg/dL   Creatinine, Ser 1.35 (H) 0.57 - 1.00 mg/dL   GFR calc non Af Amer 38 (L) >59 mL/min/1.73   GFR calc Af Amer 43 (L) >59 mL/min/1.73   BUN/Creatinine Ratio 18 12 - 28   Sodium 140 134 - 144 mmol/L   Potassium 4.5 3.5 - 5.2 mmol/L   Chloride 103 96 - 106 mmol/L   CO2 23 20 - 29 mmol/L   Calcium 10.0 8.7 - 10.3 mg/dL   Total Protein 7.0 6.0 - 8.5 g/dL   Albumin 4.2 3.7 - 4.7 g/dL   Globulin, Total 2.8 1.5 - 4.5 g/dL   Albumin/Globulin Ratio 1.5 1.2 - 2.2   Bilirubin Total 0.3 0.0 - 1.2 mg/dL   Alkaline Phosphatase 104 39 -  117 IU/L   AST 18 0 - 40  IU/L   ALT 13 0 - 32 IU/L  Lipid panel  Result Value Ref Range   Cholesterol, Total 211 (H) 100 - 199 mg/dL   Triglycerides 138 0 - 149 mg/dL   HDL 73 >39 mg/dL   VLDL Cholesterol Cal 28 5 - 40 mg/dL   LDL Calculated 110 (H) 0 - 99 mg/dL   Chol/HDL Ratio 2.9 0.0 - 4.4 ratio      Assessment & Plan:   1. Hyperlipidemia, unspecified hyperlipidemia type - CMP14+EGFR - Lipid panel  2. Acute pulmonary embolism without acute cor pulmonale, unspecified pulmonary embolism type (HCC) Continue Eliquis 5 mg 1 twice daily Recheck in office in 6 months to see how patient is doing Consider 6 to 12 months of therapy  3. Acute deep vein thrombosis (DVT) of distal vein of right lower extremity ([place]) See above treatment for pulmonary embolism   Continue all other maintenance medications as listed above.  Follow up plan: Return in about 6 months (around 04/07/2019).  Educational handout given for [place] PA-C [facility] [address]  [place], [place] [address] [phone]   10/09/2018, 8:29 AM

## 2018-10-14 ENCOUNTER — Telehealth: Payer: Self-pay | Admitting: Physician Assistant

## 2018-10-14 ENCOUNTER — Other Ambulatory Visit: Payer: Self-pay | Admitting: Physician Assistant

## 2018-10-14 ENCOUNTER — Other Ambulatory Visit (HOSPITAL_COMMUNITY): Payer: Self-pay | Admitting: Physician Assistant

## 2018-10-14 MED ORDER — APIXABAN 5 MG PO TABS: 5.0000 mg | ORAL_TABLET | Freq: Two times a day (BID) | ORAL | 5 refills | Status: DC

## 2018-10-14 NOTE — Telephone Encounter (Signed)
Patient aware.

## 2018-10-14 NOTE — Telephone Encounter (Signed)
eliquis 5 mg BID is sent to [facility] with refills

## 2019-03-05 ENCOUNTER — Telehealth: Payer: Self-pay | Admitting: Physician Assistant

## 2019-03-05 NOTE — Chronic Care Management (AMB) (Signed)
Chronic Care Management   Note  03/05/2019 Name: LUCIEL BRICKMAN MRN: [id number] DOB: 21-Nov-1940  PENNIE VANBLARCOM is a 78 y.o. year old female who is a primary care patient of Terald Sleeper, PA-C. I reached out to [name] by phone today in response to a referral sent by Ms. Jesse Sans Malak's health plan.    Ms. Dales was given information about Chronic Care Management services today including:  1. CCM service includes personalized support from designated clinical staff supervised by her physician, including individualized plan of care and coordination with other care providers 2. 24/7 contact phone numbers for assistance for urgent and routine care needs. 3. Service will only be billed when office clinical staff spend 20 minutes or more in a month to coordinate care. 4. Only one practitioner may furnish and bill the service in a calendar month. 5. The patient may stop CCM services at any time (effective at the end of the month) by phone call to the office staff. 6. The patient will be responsible for cost sharing (co-pay) of up to 20% of the service fee (after annual deductible is met).  Patient agreed to services and verbal consent obtained.   Follow up plan: Telephone appointment with CCM team member scheduled for: 03/12/2019  [place]  [email or web address]   ??[phone]

## 2019-03-12 ENCOUNTER — Telehealth: Payer: Medicare HMO | Admitting: *Deleted

## 2019-03-14 ENCOUNTER — Other Ambulatory Visit: Payer: Self-pay | Admitting: Physician Assistant

## 2019-03-28 ENCOUNTER — Other Ambulatory Visit: Payer: Self-pay | Admitting: Physician Assistant

## 2019-03-28 DIAGNOSIS — Z1231 Encounter for screening mammogram for malignant neoplasm of breast: Secondary | ICD-10-CM

## 2019-04-15 ENCOUNTER — Other Ambulatory Visit: Payer: Self-pay | Admitting: Physician Assistant

## 2019-05-02 ENCOUNTER — Other Ambulatory Visit: Payer: Self-pay

## 2019-05-05 ENCOUNTER — Other Ambulatory Visit: Payer: Self-pay

## 2019-05-05 ENCOUNTER — Encounter: Payer: Self-pay | Admitting: Physician Assistant

## 2019-05-05 ENCOUNTER — Ambulatory Visit (INDEPENDENT_AMBULATORY_CARE_PROVIDER_SITE_OTHER): Payer: Medicare HMO | Admitting: Physician Assistant

## 2019-05-05 VITALS — BP 146/75 | HR 70 | Temp 97.1°F | Ht 62.5 in | Wt 148.6 lb

## 2019-05-05 DIAGNOSIS — R635 Abnormal weight gain: Secondary | ICD-10-CM

## 2019-05-05 DIAGNOSIS — Z23 Encounter for immunization: Secondary | ICD-10-CM

## 2019-05-05 DIAGNOSIS — Z0000 Encounter for general adult medical examination without abnormal findings: Secondary | ICD-10-CM | POA: Diagnosis not present

## 2019-05-05 DIAGNOSIS — E7841 Elevated Lipoprotein(a): Secondary | ICD-10-CM | POA: Diagnosis not present

## 2019-05-05 DIAGNOSIS — I824Z1 Acute embolism and thrombosis of unspecified deep veins of right distal lower extremity: Secondary | ICD-10-CM

## 2019-05-05 DIAGNOSIS — I2699 Other pulmonary embolism without acute cor pulmonale: Secondary | ICD-10-CM | POA: Diagnosis not present

## 2019-05-05 DIAGNOSIS — J410 Simple chronic bronchitis: Secondary | ICD-10-CM | POA: Diagnosis not present

## 2019-05-05 DIAGNOSIS — E785 Hyperlipidemia, unspecified: Secondary | ICD-10-CM | POA: Diagnosis not present

## 2019-05-05 MED ORDER — ALBUTEROL SULFATE (2.5 MG/3ML) 0.083% IN NEBU: 2.5000 mg | INHALATION_SOLUTION | Freq: Four times a day (QID) | RESPIRATORY_TRACT | 12 refills | Status: DC | PRN

## 2019-05-05 MED ORDER — CEFDINIR 300 MG PO CAPS: 300.0000 mg | ORAL_CAPSULE | Freq: Two times a day (BID) | ORAL | 2 refills | Status: DC

## 2019-05-05 MED ORDER — TIOTROPIUM BROMIDE MONOHYDRATE 18 MCG IN CAPS: 18.0000 ug | ORAL_CAPSULE | Freq: Every day | RESPIRATORY_TRACT | 12 refills | Status: DC

## 2019-05-05 MED ORDER — PHENTERMINE HCL 37.5 MG PO TABS: 37.5000 mg | ORAL_TABLET | Freq: Every day | ORAL | 1 refills | Status: DC

## 2019-05-05 MED ORDER — ATORVASTATIN CALCIUM 10 MG PO TABS: ORAL_TABLET | ORAL | 11 refills | Status: DC

## 2019-05-05 MED ORDER — BUDESONIDE-FORMOTEROL FUMARATE 160-4.5 MCG/ACT IN AERO: 2.0000 | INHALATION_SPRAY | Freq: Two times a day (BID) | RESPIRATORY_TRACT | 12 refills | Status: DC

## 2019-05-05 NOTE — Patient Instructions (Signed)
Breakfast: eggs 2-3 Or greek yogurt low fat DANNON 1 slice delightful Lynnae Sandhoff bread  Lunch: 2 slice Lynnae Sandhoff delightfully bread       Or Nature's Own Light 4 ounces chicken, [place], roast beef 1 slice Thin sliced cheese S[place]ento Mustard ok 1 piece of fruit  Supper: 6 ounces lean meat 2 cups raw/cooked veg or 1 cup pintos, corn lima  3 snacks 100 calories or less

## 2019-05-05 NOTE — Progress Notes (Signed)
BP (!) 146/75   Pulse 70   Temp (!) 97.1 F (36.2 C) (Temporal)   Ht 5' 2.5" (1.588 m)   Wt 148 lb 9.6 oz (67.4 kg)   SpO2 100%   BMI 26.75 kg/m    Subjective:    Patient ID: Angela Parrish, female    DOB: [birth year], 78 y.o.   MRN: [id number]  HPI: Angela Parrish is a 78 y.o. female presenting on 05/05/2019 for Follow-up (on Pulmonary Embolis )  This patient comes in for follow-up on her pulmonary embolism and DVT.  It has been over 6 months and she is taken Eliquis throughout this time.  She is feeling quite good and is fully active again with no issues.  Her clot occurred in a post surgical state.  She has never had any risk factors for hypercoagulability other than smoking.  At this time we can discontinue the medication of Eliquis.  She can use of aspirin if wanted a small dose, but it is not indicated.  She will call if any symptoms recur  Patient is also wanting refills on some of her medications for her COPD, weight gain, hyperlipidemia.  All of her medications are reviewed and refilled.  She will have up-to-date labs performed.  We will see her back as needed.  Past Medical History:  Diagnosis Date  . Abnormal Pap smear    Previous colposcopy  . Arthritis    hip   . Asthma    childhood    . Breast cancer ([place]) 2000   Status post left lumpectomy  . COPD (chronic obstructive pulmonary disease) ([place])   . Dyspnea    on exertion ; improved with use Spiriva   . Headache    occ  . Hyperlipidemia   . Hypertension   . Personal history of radiation therapy 2000  . Pulmonary emboli (HCC)    Relevant past medical, surgical, family and social history reviewed and updated as indicated. Interim medical history since our last visit reviewed. Allergies and medications reviewed and updated. DATA REVIEWED: CHART IN EPIC  Family History reviewed for pertinent findings.  Review of Systems  Constitutional: Negative.  Negative for activity change, fatigue and fever.  HENT:  Negative.   Eyes: Negative.   Respiratory: Negative.  Negative for cough.   Cardiovascular: Negative.  Negative for chest pain.  Gastrointestinal: Negative.  Negative for abdominal pain.  Endocrine: Negative.   Genitourinary: Negative.  Negative for dysuria.  Musculoskeletal: Negative.   Skin: Negative.   Neurological: Negative.     Allergies as of 05/05/2019   No Known Allergies     Medication List       Accurate as of May 05, 2019 11:59 PM. If you have any questions, ask your nurse or doctor.        STOP taking these medications   Eliquis 5 MG Tabs tablet Generic drug: apixaban Stopped by: Terald Sleeper, PA-C     TAKE these medications   acetaminophen 325 MG tablet Commonly known as: TYLENOL Take 2 tablets (650 mg total) by mouth every 6 (six) hours as needed for mild pain (or Fever >/= 101).   albuterol 108 (90 Base) MCG/ACT inhaler Commonly known as: VENTOLIN HFA Inhale 2 puffs into the lungs every 6 (six) hours as needed for wheezing or shortness of breath. Prefers VENTOLIN   albuterol (2.5 MG/3ML) 0.083% nebulizer solution Commonly known as: PROVENTIL Take 3 mLs (2.5 mg total) by nebulization every 6 (six) hours  as needed for wheezing or shortness of breath.   atorvastatin 10 MG tablet Commonly known as: LIPITOR TAKE ONE (1) TABLET EACH DAY   budesonide-formoterol 160-4.5 MCG/ACT inhaler Commonly known as: SYMBICORT Inhale 2 puffs into the lungs 2 (two) times daily.   cefdinir 300 MG capsule Commonly known as: OMNICEF Take 1 capsule (300 mg total) by mouth 2 (two) times daily. 1 po BID Started by: Terald Sleeper, PA-C   phentermine 37.5 MG tablet Commonly known as: ADIPEX-P Take 1 tablet (37.5 mg total) by mouth daily before breakfast. Started by: Terald Sleeper, PA-C   tiotropium 18 MCG inhalation capsule Commonly known as: SPIRIVA Place 1 capsule (18 mcg total) into inhaler and inhale daily.          Objective:    BP (!) 146/75   Pulse  70   Temp (!) 97.1 F (36.2 C) (Temporal)   Ht 5' 2.5" (1.588 m)   Wt 148 lb 9.6 oz (67.4 kg)   SpO2 100%   BMI 26.75 kg/m   No Known Allergies  Wt Readings from Last 3 Encounters:  05/05/19 148 lb 9.6 oz (67.4 kg)  10/07/18 144 lb 3.2 oz (65.4 kg)  09/21/18 142 lb (64.4 kg)    Physical Exam Constitutional:      General: She is not in acute distress.    Appearance: Normal appearance. She is well-developed.  HENT:     Head: Normocephalic and atraumatic.  Cardiovascular:     Rate and Rhythm: Normal rate.  Pulmonary:     Effort: Pulmonary effort is normal.     Breath sounds: Wheezing present.  Skin:    General: Skin is warm and dry.     Findings: No rash.  Neurological:     Mental Status: She is alert and oriented to person, place, and time.     Deep Tendon Reflexes: Reflexes are normal and symmetric.     Results for orders placed or performed in visit on 05/05/19  CBC with Differential/Platelet  Result Value Ref Range   WBC 7.6 3.4 - 10.8 x10E3/uL   RBC 3.71 (L) 3.77 - 5.28 x10E6/uL   Hemoglobin 11.3 11.1 - 15.9 g/dL   Hematocrit 35.1 34.0 - 46.6 %   MCV 95 79 - 97 fL   MCH 30.5 26.6 - 33.0 pg   MCHC 32.2 31.5 - 35.7 g/dL   RDW 12.2 11.7 - 15.4 %   Platelets 331 150 - 450 x10E3/uL   Neutrophils 64 Not Estab. %   Lymphs 16 Not Estab. %   Monocytes 9 Not Estab. %   Eos 10 Not Estab. %   Basos 1 Not Estab. %   Neutrophils Absolute 4.8 1.4 - 7.0 x10E3/uL   Lymphocytes Absolute 1.3 0.7 - 3.1 x10E3/uL   Monocytes Absolute 0.7 0.1 - 0.9 x10E3/uL   EOS (ABSOLUTE) 0.8 (H) 0.0 - 0.4 x10E3/uL   Basophils Absolute 0.1 0.0 - 0.2 x10E3/uL   Immature Granulocytes 0 Not Estab. %   Immature Grans (Abs) 0.0 0.0 - 0.1 x10E3/uL  CMP14+EGFR  Result Value Ref Range   Glucose 84 65 - 99 mg/dL   BUN 24 8 - 27 mg/dL   Creatinine, Ser 1.90 (H) 0.57 - 1.00 mg/dL   GFR calc non Af Amer 25 (L) >59 mL/min/1.73   GFR calc Af Amer 28 (L) >59 mL/min/1.73   BUN/Creatinine Ratio 13 12 -  28   Sodium 142 134 - 144 mmol/L   Potassium 4.8 3.5 -  5.2 mmol/L   Chloride 106 96 - 106 mmol/L   CO2 20 20 - 29 mmol/L   Calcium 9.4 8.7 - 10.3 mg/dL   Total Protein 6.7 6.0 - 8.5 g/dL   Albumin 4.0 3.7 - 4.7 g/dL   Globulin, Total 2.7 1.5 - 4.5 g/dL   Albumin/Globulin Ratio 1.5 1.2 - 2.2   Bilirubin Total 0.3 0.0 - 1.2 mg/dL   Alkaline Phosphatase 95 39 - 117 IU/L   AST 15 0 - 40 IU/L   ALT 8 0 - 32 IU/L  Lipid panel  Result Value Ref Range   Cholesterol, Total 173 100 - 199 mg/dL   Triglycerides 93 0 - 149 mg/dL   HDL 47 >39 mg/dL   VLDL Cholesterol Cal 17 5 - 40 mg/dL   LDL Chol Calc (NIH) 109 (H) 0 - 99 mg/dL   Chol/HDL Ratio 3.7 0.0 - 4.4 ratio  TSH  Result Value Ref Range   TSH 4.920 (H) 0.450 - 4.500 uIU/mL      Assessment & Plan:   1. Acute pulmonary embolism without acute cor pulmonale, unspecified pulmonary embolism type ([place]) Discontinue Eliquis May use aspirin if desired Call if any symptoms return  2. Acute deep vein thrombosis (DVT) of distal vein of right lower extremity (HCC) Discontinue Eliquis May use aspirin if desired Call if any symptoms return  3. Hyperlipidemia - atorvastatin (LIPITOR) 10 MG tablet; TAKE ONE (1) TABLET EACH DAY  Dispense: 30 tablet; Refill: 11 - Lipid panel  4. Need for immunization against influenza - Flu Vaccine QUAD High Dose(Fluad)  5. Simple chronic bronchitis (HCC) - albuterol (PROVENTIL) (2.5 MG/3ML) 0.083% nebulizer solution; Take 3 mLs (2.5 mg total) by nebulization every 6 (six) hours as needed for wheezing or shortness of breath.  Dispense: 360 mL; Refill: 12 - tiotropium (SPIRIVA) 18 MCG inhalation capsule; Place 1 capsule (18 mcg total) into inhaler and inhale daily.  Dispense: 30 capsule; Refill: 12 - budesonide-formoterol (SYMBICORT) 160-4.5 MCG/ACT inhaler; Inhale 2 puffs into the lungs 2 (two) times daily.  Dispense: 1 Inhaler; Refill: 12 - cefdinir (OMNICEF) 300 MG capsule; Take 1 capsule (300 mg total) by  mouth 2 (two) times daily. 1 po BID  Dispense: 20 capsule; Refill: 2  6. Weight gain - phentermine (ADIPEX-P) 37.5 MG tablet; Take 1 tablet (37.5 mg total) by mouth daily before breakfast.  Dispense: 30 tablet; Refill: 1  7. Well adult exam - CBC with Differential/Platelet - CMP14+EGFR - Lipid panel - TSH     Continue all other maintenance medications as listed above.  Follow up plan: No follow-ups on file.  Educational handout given for [place] PA-C [facility] [address]  [place], [place] [address] [phone]   05/07/2019, 2:54 PM

## 2019-05-06 ENCOUNTER — Other Ambulatory Visit: Payer: Self-pay | Admitting: Physician Assistant

## 2019-05-06 DIAGNOSIS — R944 Abnormal results of kidney function studies: Secondary | ICD-10-CM

## 2019-05-06 LAB — CBC WITH DIFFERENTIAL/PLATELET
Basophils Absolute: 0.1 10*3/uL (ref 0.0–0.2)
Basos: 1 %
EOS (ABSOLUTE): 0.8 10*3/uL — ABNORMAL HIGH (ref 0.0–0.4)
Eos: 10 %
Hematocrit: 35.1 % (ref 34.0–46.6)
Hemoglobin: 11.3 g/dL (ref 11.1–15.9)
Immature Grans (Abs): 0 10*3/uL (ref 0.0–0.1)
Immature Granulocytes: 0 %
Lymphocytes Absolute: 1.3 10*3/uL (ref 0.7–3.1)
Lymphs: 16 %
MCH: 30.5 pg (ref 26.6–33.0)
MCHC: 32.2 g/dL (ref 31.5–35.7)
MCV: 95 fL (ref 79–97)
Monocytes Absolute: 0.7 10*3/uL (ref 0.1–0.9)
Monocytes: 9 %
Neutrophils Absolute: 4.8 10*3/uL (ref 1.4–7.0)
Neutrophils: 64 %
Platelets: 331 10*3/uL (ref 150–450)
RBC: 3.71 x10E6/uL — ABNORMAL LOW (ref 3.77–5.28)
RDW: 12.2 % (ref 11.7–15.4)
WBC: 7.6 10*3/uL (ref 3.4–10.8)

## 2019-05-06 LAB — LIPID PANEL
Chol/HDL Ratio: 3.7 ratio (ref 0.0–4.4)
Cholesterol, Total: 173 mg/dL (ref 100–199)
HDL: 47 mg/dL (ref >39)
LDL Chol Calc (NIH): 109 mg/dL — ABNORMAL HIGH (ref 0–99)
Triglycerides: 93 mg/dL (ref 0–149)
VLDL Cholesterol Cal: 17 mg/dL (ref 5–40)

## 2019-05-06 LAB — CMP14+EGFR
ALT: 8 IU/L (ref 0–32)
AST: 15 IU/L (ref 0–40)
Albumin/Globulin Ratio: 1.5 (ref 1.2–2.2)
Albumin: 4 g/dL (ref 3.7–4.7)
Alkaline Phosphatase: 95 IU/L (ref 39–117)
BUN/Creatinine Ratio: 13 (ref 12–28)
BUN: 24 mg/dL (ref 8–27)
Bilirubin Total: 0.3 mg/dL (ref 0.0–1.2)
CO2: 20 mmol/L (ref 20–29)
Calcium: 9.4 mg/dL (ref 8.7–10.3)
Chloride: 106 mmol/L (ref 96–106)
Creatinine, Ser: 1.9 mg/dL — ABNORMAL HIGH (ref 0.57–1.00)
GFR calc Af Amer: 28 mL/min/{1.73_m2} — ABNORMAL LOW (ref >59)
GFR calc non Af Amer: 25 mL/min/{1.73_m2} — ABNORMAL LOW (ref >59)
Globulin, Total: 2.7 g/dL (ref 1.5–4.5)
Glucose: 84 mg/dL (ref 65–99)
Potassium: 4.8 mmol/L (ref 3.5–5.2)
Sodium: 142 mmol/L (ref 134–144)
Total Protein: 6.7 g/dL (ref 6.0–8.5)

## 2019-05-06 LAB — TSH: TSH: 4.92 u[IU]/mL — ABNORMAL HIGH (ref 0.450–4.500)

## 2019-05-14 ENCOUNTER — Ambulatory Visit
Admission: RE | Admit: 2019-05-14 | Discharge: 2019-05-14 | Disposition: A | Discharge status: Home / Self Care | Payer: Medicare HMO | Source: Ambulatory Visit | Attending: Physician Assistant | Admitting: Physician Assistant

## 2019-05-14 ENCOUNTER — Other Ambulatory Visit: Payer: Self-pay

## 2019-05-14 DIAGNOSIS — Z1231 Encounter for screening mammogram for malignant neoplasm of breast: Secondary | ICD-10-CM

## 2019-05-15 ENCOUNTER — Telehealth: Payer: Self-pay | Admitting: Physician Assistant

## 2019-05-15 NOTE — Telephone Encounter (Signed)
Patient would like angel to call her in regards to her mamo results. Aware Glenard Haring is out of the officer today. Please advise

## 2019-05-16 ENCOUNTER — Other Ambulatory Visit: Payer: Self-pay | Admitting: Physician Assistant

## 2019-05-16 DIAGNOSIS — R928 Other abnormal and inconclusive findings on diagnostic imaging of breast: Secondary | ICD-10-CM

## 2019-05-16 NOTE — Telephone Encounter (Signed)
Patient aware that order was signed and sent to [facility]

## 2019-05-16 NOTE — Telephone Encounter (Signed)
Lmtcb/ww  

## 2019-05-16 NOTE — Telephone Encounter (Signed)
Called another time, no answer. And tried cell no answer.

## 2019-05-16 NOTE — Telephone Encounter (Signed)
I have called 3 times today to the home number, no answer.

## 2019-05-19 ENCOUNTER — Ambulatory Visit (INDEPENDENT_AMBULATORY_CARE_PROVIDER_SITE_OTHER): Payer: Medicare HMO | Admitting: *Deleted

## 2019-05-19 ENCOUNTER — Ambulatory Visit
Admission: RE | Admit: 2019-05-19 | Discharge: 2019-05-19 | Disposition: A | Discharge status: Home / Self Care | Payer: Medicare HMO | Source: Ambulatory Visit | Attending: Physician Assistant | Admitting: Physician Assistant

## 2019-05-19 ENCOUNTER — Other Ambulatory Visit: Payer: Self-pay

## 2019-05-19 DIAGNOSIS — Z0000 Encounter for general adult medical examination without abnormal findings: Secondary | ICD-10-CM | POA: Diagnosis not present

## 2019-05-19 DIAGNOSIS — N6011 Diffuse cystic mastopathy of right breast: Secondary | ICD-10-CM | POA: Diagnosis not present

## 2019-05-19 DIAGNOSIS — R928 Other abnormal and inconclusive findings on diagnostic imaging of breast: Secondary | ICD-10-CM

## 2019-05-19 NOTE — Progress Notes (Signed)
MEDICARE ANNUAL WELLNESS VISIT  05/19/2019  Telephone Visit Disclaimer This Medicare AWV was conducted by telephone due to national recommendations for restrictions regarding the COVID-19 Pandemic (e.g. social distancing).  I verified, using two identifiers, that I am speaking with Angela Parrish or their authorized healthcare agent. I discussed the limitations, risks, security, and privacy concerns of performing an evaluation and management service by telephone and the potential availability of an in-person appointment in the future. The patient expressed understanding and agreed to proceed.   Subjective:  Angela Parrish is a 78 y.o. female patient of Terald Sleeper, PA-C who had a Medicare Annual Wellness Visit today via telephone. Angela Parrish is Retired and lives with an adult companion. she has 1 child. she reports that she is socially active and does interact with friends/family regularly. she is minimally physically active and enjoys sewing, reading, crafting and shopping.  Patient Care Team: Theodoro Clock as PCP - General (General Practice) Rogene Houston, MD as Consulting Physician (Gastroenterology) Gaynelle Arabian, MD as Consulting Physician (Orthopedic Surgery) Ilean China, RN as Registered Nurse  Advanced Directives 05/19/2019 09/22/2018 09/21/2018 09/21/2018 07/17/2018 07/12/2018 05/17/2018  Does Patient Have a Medical Advance Directive? Yes No Yes Yes No No No  Type of Paramedic of [place];Living will Healthcare Power of Attorney Living will [place] - - -  Does patient want to make changes to medical advance directive? No - Patient declined No - Patient declined No - Patient declined No - Patient declined - - -  Copy of [place] in Chart? Yes - validated most recent copy scanned in chart (See row information) No - copy requested No - copy requested No - copy requested - - -  Would patient like information on  creating a medical advance directive? - No - Patient declined No - Patient declined No - Patient declined No - Patient declined - -    Hospital Utilization Over the Past 12 Months: # of hospitalizations or ER visits: 1 # of surgeries: 1  Review of Systems    Patient reports that her overall health is better compared to last year.  History obtained from chart review  Patient Reported Readings (BP, Pulse, CBG, Weight, etc) none  Pain Assessment Pain : No/denies pain     Current Medications & Allergies (verified) Allergies as of 05/19/2019   No Known Allergies     Medication List       Accurate as of May 19, 2019  8:41 AM. If you have any questions, ask your nurse or doctor.        STOP taking these medications   cefdinir 300 MG capsule Commonly known as: OMNICEF   phentermine 37.5 MG tablet Commonly known as: ADIPEX-P     TAKE these medications   acetaminophen 325 MG tablet Commonly known as: TYLENOL Take 2 tablets (650 mg total) by mouth every 6 (six) hours as needed for mild pain (or Fever >/= 101).   albuterol 108 (90 Base) MCG/ACT inhaler Commonly known as: VENTOLIN HFA Inhale 2 puffs into the lungs every 6 (six) hours as needed for wheezing or shortness of breath. Prefers VENTOLIN   albuterol (2.5 MG/3ML) 0.083% nebulizer solution Commonly known as: PROVENTIL Take 3 mLs (2.5 mg total) by nebulization every 6 (six) hours as needed for wheezing or shortness of breath.   atorvastatin 10 MG tablet Commonly known as: LIPITOR TAKE ONE (1) TABLET EACH DAY  budesonide-formoterol 160-4.5 MCG/ACT inhaler Commonly known as: SYMBICORT Inhale 2 puffs into the lungs 2 (two) times daily.   tiotropium 18 MCG inhalation capsule Commonly known as: SPIRIVA Place 1 capsule (18 mcg total) into inhaler and inhale daily.       History (reviewed): Past Medical History:  Diagnosis Date  . Abnormal Pap smear    Previous colposcopy  . Arthritis    hip   .  Asthma    childhood    . Breast cancer ([place]) 2000   Status post left lumpectomy  . COPD (chronic obstructive pulmonary disease) ([place])   . Dyspnea    on exertion ; improved with use Spiriva   . Headache    occ  . Hyperlipidemia   . Hypertension   . Personal history of radiation therapy 2000  . Pulmonary emboli [facility])    Past Surgical History:  Procedure Laterality Date  . BREAST LUMPECTOMY  2000   Left breast  . CATARACT EXTRACTION, BILATERAL    . COLONOSCOPY N/A 09/16/2014   Procedure: COLONOSCOPY;  Surgeon: Rogene Houston, MD;  Location: [facility] SUITE;  Service: Endoscopy;  Laterality: N/A;  830 - moved to 9:15 - Ann to notify  . JOINT REPLACEMENT  06/2018   right hip  . KNEE CARTILAGE SURGERY Right   . TOTAL HIP ARTHROPLASTY Right 07/17/2018   Procedure: RIGHT TOTAL HIP ARTHROPLASTY ANTERIOR APPROACH;  Surgeon: Gaynelle Arabian, MD;  Location: [facility] ORS;  Service: Orthopedics;  Laterality: Right;  151min   Family History  Problem Relation Age of Onset  . Heart disease Father   . Lung cancer Father        smoker  . Heart attack Father   . Colon cancer Mother 56  . Heart attack Mother   . Heart disease Sister   . Hypertension Brother   . Heart attack Brother   . Leukemia Brother   . Obesity Daughter   . Anxiety disorder Daughter   . Hypertension Brother    Social History   Socioeconomic History  . Marital status: Widowed    Spouse name: Not on file  . Number of children: 1  . Years of education: 106  . Highest education level: Some college, no degree  Occupational History  . Occupation: Retired    Comment: [employer]  Social Needs  . Financial resource strain: Not very hard  . Food insecurity    Worry: Never true    Inability: Never true  . Transportation needs    Medical: No    Non-medical: No  Tobacco Use  . Smoking status: Former Smoker    Types: Cigarettes    Quit date: 08/14/2007    Years since quitting: 11.7  . Smokeless tobacco: Never Used  Substance and  Sexual Activity  . Alcohol use: No  . Drug use: No  . Sexual activity: Yes    Birth control/protection: Post-menopausal  Lifestyle  . Physical activity    Days per week: 3 days    Minutes per session: 20 min  . Stress: Only a little  Relationships  . Social connections    Talks on phone: More than three times a week    Gets together: More than three times a week    Attends religious service: Never    Active member of club or organization: No    Attends meetings of clubs or organizations: Never    Relationship status: Living with partner  Other Topics Concern  . Not on file  Social History Narrative  . Not on file    Activities of Daily Living In your present state of health, do you have any difficulty performing the following activities: 05/19/2019 09/22/2018  Hearing? N -  Vision? N -  Difficulty concentrating or making decisions? N -  Walking or climbing stairs? Y -  Comment walking long distances and a lot of stairs makes her SOB -  Dressing or bathing? N -  Doing errands, shopping? N N  Preparing Food and eating ? N -  Using the Toilet? N -  In the past six months, have you accidently leaked urine? N -  Do you have problems with loss of bowel control? N -  Managing your Medications? N -  Managing your Finances? N -  Housekeeping or managing your Housekeeping? N -  Some recent data might be hidden    Patient Education/ Literacy How often do you need to have someone help you when you read instructions, pamphlets, or other written materials from your doctor or pharmacy?: 1 - Never What is the last grade level you completed in school?: Some college-no degree  Exercise Current Exercise Habits: Home exercise routine, Type of exercise: walking, Time (Minutes): 15, Frequency (Times/Week): 3, Weekly Exercise (Minutes/Week): 45, Intensity: Mild, Exercise limited by: orthopedic condition(s);respiratory conditions(s)  Diet Patient reports consuming 3 meals a day and 1 snack(s)  a day Patient reports that her primary diet is: Regular Patient reports that she does have regular access to food.   Depression Screen PHQ 2/9 Scores 05/19/2019 05/05/2019 10/07/2018 09/17/2018 08/30/2018 07/03/2018 05/17/2018  PHQ - 2 Score 0 0 0 0 0 0 0  PHQ- 9 Score - - - - - - -     Fall Risk Fall Risk  05/19/2019 05/05/2019 10/07/2018 09/17/2018 08/30/2018  Falls in the past year? 0 0 0 0 0  Number falls in past yr: 0 - - - -  Injury with Fall? 0 - - - -  Follow up Falls prevention discussed - - - -  Comment get rid of all throw rugs in the house, adequate lighting in the walkways and grab bars in the bathroom - - - -     Objective:  Angela Parrish seemed alert and oriented and she participated appropriately during our telephone visit.  Blood Pressure Weight BMI  BP Readings from Last 3 Encounters:  05/05/19 (!) 146/75  10/07/18 116/73  09/22/18 (!) 123/42   Wt Readings from Last 3 Encounters:  05/05/19 148 lb 9.6 oz (67.4 kg)  10/07/18 144 lb 3.2 oz (65.4 kg)  09/21/18 142 lb (64.4 kg)   BMI Readings from Last 1 Encounters:  05/05/19 26.75 kg/m    *Unable to obtain current vital signs, weight, and BMI due to telephone visit type  Hearing/Vision  . Angela Parrish did not seem to have difficulty with hearing/understanding during the telephone conversation . Reports that she has not had a formal eye exam by an eye care professional within the past year . Reports that she has not had a formal hearing evaluation within the past year *Unable to fully assess hearing and vision during telephone visit type  Cognitive Function: 6CIT Screen 05/19/2019  What Year? 0 points  What month? 0 points  What time? 0 points  Count back from 20 0 points  Months in reverse 0 points  Repeat phrase 2 points  Total Score 2   (Normal:0-7, Significant for Dysfunction: >8)  Normal Cognitive Function Screening: Yes   Immunization &  Health Maintenance Record Immunization History  Administered Date(s)  Administered  . Fluad Quad(high Dose 65+) 05/05/2019  . Influenza, High Dose Seasonal PF 07/11/2016, 06/13/2017, 05/17/2018  . Pneumococcal Conjugate-13 06/20/2017  . Pneumococcal Polysaccharide-23 06/25/2014  . Tdap 11/22/2016  . Zoster 09/19/2008    Health Maintenance  Topic Date Due  . DEXA SCAN  04/28/2005  . TETANUS/TDAP  11/23/2026  . INFLUENZA VACCINE  Completed  . PNA vac Low Risk Adult  Completed       Assessment  This is a routine wellness examination for Angela Parrish.  Health Maintenance: Due or Overdue Health Maintenance Due  Topic Date Due  . DEXA SCAN  04/28/2005    Angela Parrish does not need a referral for Community Assistance: Care Management:   no Social Work:    no Prescription Assistance:  no Nutrition/Diabetes Education:  no   Plan:  Personalized Goals Goals Addressed            This Visit's Progress   . DIET - INCREASE WATER INTAKE       Try to drink 6-8 glasses of water daily.      Personalized Health Maintenance & Screening Recommendations  Bone densitometry screening  Lung Cancer Screening Recommended: no (Low Dose CT Chest recommended if Age 20-80 years, 30 pack-year currently smoking OR have quit w/in past 15 years) Hepatitis C Screening recommended: no HIV Screening recommended: no  Advanced Directives: Written information was not prepared per patient's request.  Referrals & Orders No orders of the defined types were placed in this encounter.   Follow-up Plan . Follow-up with Terald Sleeper, PA-C as planned . Schedule your DEXA scan as discussed   I have personally reviewed and noted the following in the patient's chart:   . Medical and social history . Use of alcohol, tobacco or illicit drugs  . Current medications and supplements . Functional ability and status . Nutritional status . Physical activity . Advanced directives . List of other physicians . Hospitalizations, surgeries, and ER visits in previous 12  months . Vitals . Screenings to include cognitive, depression, and falls . Referrals and appointments  In addition, I have reviewed and discussed with Angela Parrish certain preventive protocols, quality metrics, and best practice recommendations. A written personalized care plan for preventive services as well as general preventive health recommendations is available and can be mailed to the patient at her request.      Milas Hock, LPN  [id number]

## 2019-05-19 NOTE — Patient Instructions (Signed)
Preventive Care 38 Years and Older, Female Preventive care refers to lifestyle choices and visits with your health care provider that can promote health and wellness. This includes:  A yearly physical exam. This is also called an annual well check.  Regular dental and eye exams.  Immunizations.  Screening for certain conditions.  Healthy lifestyle choices, such as diet and exercise. What can I expect for my preventive care visit? Physical exam Your health care provider will check:  Height and weight. These may be used to calculate body mass index (BMI), which is a measurement that tells if you are at a healthy weight.  Heart rate and blood pressure.  Your skin for abnormal spots. Counseling Your health care provider may ask you questions about:  Alcohol, tobacco, and drug use.  Emotional well-being.  Home and relationship well-being.  Sexual activity.  Eating habits.  History of falls.  Memory and ability to understand (cognition).  Work and work Statistician.  Pregnancy and menstrual history. What immunizations do I need?  Influenza (flu) vaccine  This is recommended every year. Tetanus, diphtheria, and pertussis (Tdap) vaccine  You may need a Td booster every 10 years. Varicella (chickenpox) vaccine  You may need this vaccine if you have not already been vaccinated. Zoster (shingles) vaccine  You may need this after age 33. Pneumococcal conjugate (PCV13) vaccine  One dose is recommended after age 33. Pneumococcal polysaccharide (PPSV23) vaccine  One dose is recommended after age 72. Measles, mumps, and rubella (MMR) vaccine  You may need at least one dose of MMR if you were born in 1957 or later. You may also need a second dose. Meningococcal conjugate (MenACWY) vaccine  You may need this if you have certain conditions. Hepatitis A vaccine  You may need this if you have certain conditions or if you travel or work in places where you may be exposed  to hepatitis A. Hepatitis B vaccine  You may need this if you have certain conditions or if you travel or work in places where you may be exposed to hepatitis B. Haemophilus influenzae type b (Hib) vaccine  You may need this if you have certain conditions. You may receive vaccines as individual doses or as more than one vaccine together in one shot (combination vaccines). Talk with your health care provider about the risks and benefits of combination vaccines. What tests do I need? Blood tests  Lipid and cholesterol levels. These may be checked every 5 years, or more frequently depending on your overall health.  Hepatitis C test.  Hepatitis B test. Screening  Lung cancer screening. You may have this screening every year starting at age 39 if you have a 30-pack-year history of smoking and currently smoke or have quit within the past 15 years.  Colorectal cancer screening. All adults should have this screening starting at age 36 and continuing until age 15. Your health care provider may recommend screening at age 23 if you are at increased risk. You will have tests every 1-10 years, depending on your results and the type of screening test.  Diabetes screening. This is done by checking your blood sugar (glucose) after you have not eaten for a while (fasting). You may have this done every 1-3 years.  Mammogram. This may be done every 1-2 years. Talk with your health care provider about how often you should have regular mammograms.  BRCA-related cancer screening. This may be done if you have a family history of breast, ovarian, tubal, or peritoneal cancers.  Other tests  Sexually transmitted disease (STD) testing.  Bone density scan. This is done to screen for osteoporosis. You may have this done starting at age 76. Follow these instructions at home: Eating and drinking  Eat a diet that includes fresh fruits and vegetables, whole grains, lean protein, and low-fat dairy products. Limit  your intake of foods with high amounts of sugar, saturated fats, and salt.  Take vitamin and mineral supplements as recommended by your health care provider.  Do not drink alcohol if your health care provider tells you not to drink.  If you drink alcohol: ? Limit how much you have to 0-1 drink a day. ? Be aware of how much alcohol is in your drink. In the [place] one drink equals one 12 oz bottle of beer (355 mL), one 5 oz glass of wine (148 mL), or one 1 oz glass of hard liquor (44 mL). Lifestyle  Take daily care of your teeth and gums.  Stay active. Exercise for at least 30 minutes on 5 or more days each week.  Do not use any products that contain nicotine or tobacco, such as cigarettes, e-cigarettes, and chewing tobacco. If you need help quitting, ask your health care provider.  If you are sexually active, practice safe sex. Use a condom or other form of protection in order to prevent STIs (sexually transmitted infections).  Talk with your health care provider about taking a low-dose aspirin or statin. What's next?  Go to your health care provider once a year for a well check visit.  Ask your health care provider how often you should have your eyes and teeth checked.  Stay up to date on all vaccines. This information is not intended to replace advice given to you by your health care provider. Make sure you discuss any questions you have with your health care provider. Document Released: 09/03/2015 Document Revised: 08/01/2018 Document Reviewed: 08/01/2018 Elsevier Patient Education  2020 [employer].

## 2019-05-21 ENCOUNTER — Other Ambulatory Visit: Payer: Self-pay

## 2019-05-21 ENCOUNTER — Other Ambulatory Visit: Payer: Medicare HMO

## 2019-05-21 DIAGNOSIS — R944 Abnormal results of kidney function studies: Secondary | ICD-10-CM | POA: Diagnosis not present

## 2019-05-21 DIAGNOSIS — R1084 Generalized abdominal pain: Secondary | ICD-10-CM

## 2019-05-22 LAB — BMP8+EGFR
BUN/Creatinine Ratio: 14 (ref 12–28)
BUN: 34 mg/dL — ABNORMAL HIGH (ref 8–27)
CO2: 21 mmol/L (ref 20–29)
Calcium: 9.9 mg/dL (ref 8.7–10.3)
Chloride: 104 mmol/L (ref 96–106)
Creatinine, Ser: 2.38 mg/dL — ABNORMAL HIGH (ref 0.57–1.00)
GFR calc Af Amer: 22 mL/min/{1.73_m2} — ABNORMAL LOW (ref >59)
GFR calc non Af Amer: 19 mL/min/{1.73_m2} — ABNORMAL LOW (ref >59)
Glucose: 94 mg/dL (ref 65–99)
Potassium: 5 mmol/L (ref 3.5–5.2)
Sodium: 141 mmol/L (ref 134–144)

## 2019-05-22 LAB — MICROALBUMIN / CREATININE URINE RATIO
Creatinine, Urine: 73.4 mg/dL
Microalb/Creat Ratio: 75 mg/g creat — ABNORMAL HIGH (ref 0–29)
Microalbumin, Urine: 54.7 ug/mL

## 2019-05-25 ENCOUNTER — Other Ambulatory Visit: Payer: Self-pay | Admitting: Physician Assistant

## 2019-05-25 DIAGNOSIS — N289 Disorder of kidney and ureter, unspecified: Secondary | ICD-10-CM

## 2019-06-04 ENCOUNTER — Ambulatory Visit (INDEPENDENT_AMBULATORY_CARE_PROVIDER_SITE_OTHER): Payer: Medicare HMO | Admitting: Physician Assistant

## 2019-06-04 ENCOUNTER — Encounter: Payer: Self-pay | Admitting: Physician Assistant

## 2019-06-04 DIAGNOSIS — I10 Essential (primary) hypertension: Secondary | ICD-10-CM | POA: Diagnosis not present

## 2019-06-04 MED ORDER — LISINOPRIL 5 MG PO TABS: 5.0000 mg | ORAL_TABLET | Freq: Every day | ORAL | 1 refills | Status: DC

## 2019-06-04 NOTE — Progress Notes (Signed)
Telephone visit  Subjective: CC: Headache and elevated blood pressure PCP: Terald Sleeper, PA-C LNL:[id number] Angela Parrish is a 78 y.o. female calls for telephone consult today. Patient provides verbal consent for consult held via phone.  Patient is identified with 2 separate identifiers.  At this time the entire area is on COVID-19 social distancing and stay home orders are in place.  Patient is of higher risk and therefore we are performing this by a virtual method.  Location of patient: Home Location of provider: HOME Others present for call: No  Patient reports that for the past 4 days she has had a headache.  That is been more severe.  She is never really had a problem with the past.  She has had slight elevation of her blood pressure in the past and was taking something at one point.  However she lost weight and her blood pressure became normal again.  She that she has increased her weight over this year after having surgery and then the coag restrictions.  Her blood pressure readings have been as high as 177/86 and as low as 134/73.  She denies any chest pain, shortness of breath, swelling in the lower legs.  Will restart some lisinopril and plan of her to come in in a few weeks.  She will also be having labs performed.   ROS: Per HPI  No Known Allergies Past Medical History:  Diagnosis Date  . Abnormal Pap smear    Previous colposcopy  . Arthritis    hip   . Asthma    childhood    . Breast cancer ([place]) 2000   Status post left lumpectomy  . COPD (chronic obstructive pulmonary disease) ([place])   . Dyspnea    on exertion ; improved with use Spiriva   . Headache    occ  . Hyperlipidemia   . Hypertension   . Personal history of radiation therapy 2000  . Pulmonary emboli ([facility])     Current Outpatient Medications:  .  acetaminophen (TYLENOL) 325 MG tablet, Take 2 tablets (650 mg total) by mouth every 6 (six) hours as needed for mild pain (or Fever >/= 101)., Disp: ,  Rfl:  .  albuterol (PROVENTIL HFA;VENTOLIN HFA) 108 (90 Base) MCG/ACT inhaler, Inhale 2 puffs into the lungs every 6 (six) hours as needed for wheezing or shortness of breath. Prefers VENTOLIN, Disp: 1 Inhaler, Rfl: 12 .  albuterol (PROVENTIL) (2.5 MG/3ML) 0.083% nebulizer solution, Take 3 mLs (2.5 mg total) by nebulization every 6 (six) hours as needed for wheezing or shortness of breath., Disp: 360 mL, Rfl: 12 .  atorvastatin (LIPITOR) 10 MG tablet, TAKE ONE (1) TABLET EACH DAY, Disp: 30 tablet, Rfl: 11 .  budesonide-formoterol (SYMBICORT) 160-4.5 MCG/ACT inhaler, Inhale 2 puffs into the lungs 2 (two) times daily., Disp: 1 Inhaler, Rfl: 12 .  lisinopril (ZESTRIL) 5 MG tablet, Take 1 tablet (5 mg total) by mouth daily., Disp: 90 tablet, Rfl: 1 .  tiotropium (SPIRIVA) 18 MCG inhalation capsule, Place 1 capsule (18 mcg total) into inhaler and inhale daily., Disp: 30 capsule, Rfl: 12  Assessment/ Plan: 78 y.o. female   1. Essential hypertension Low sodium diet - lisinopril (ZESTRIL) 5 MG tablet; Take 1 tablet (5 mg total) by mouth daily.  Dispense: 90 tablet; Refill: 1   Return in about 4 weeks (around 07/02/2019).  Continue all other maintenance medications as listed above.  Start time: 3:36 PM End time: 3:46 PM  Meds ordered this encounter  Medications  . lisinopril (ZESTRIL) 5 MG tablet    Sig: Take 1 tablet (5 mg total) by mouth daily.    Dispense:  90 tablet    Refill:  1    Order Specific Question:   Supervising Provider    Answer:   [name] [[id number]]    Particia Nearing PA-C [place] [phone]

## 2019-07-03 ENCOUNTER — Other Ambulatory Visit: Payer: Self-pay

## 2019-07-04 ENCOUNTER — Encounter: Payer: Self-pay | Admitting: Physician Assistant

## 2019-07-04 ENCOUNTER — Ambulatory Visit (INDEPENDENT_AMBULATORY_CARE_PROVIDER_SITE_OTHER): Payer: Medicare HMO | Admitting: Physician Assistant

## 2019-07-04 VITALS — BP 142/67 | HR 79 | Temp 97.4°F | Ht 62.5 in | Wt 152.2 lb

## 2019-07-04 DIAGNOSIS — I10 Essential (primary) hypertension: Secondary | ICD-10-CM

## 2019-07-11 NOTE — Progress Notes (Signed)
BP (!) 142/67   Pulse 79   Temp (!) 97.4 F (36.3 C) (Temporal)   Ht 5' 2.5" (1.588 m)   Wt 152 lb 3.2 oz (69 kg)   SpO2 92%   BMI 27.39 kg/m    Subjective:    Patient ID: Angela Parrish, female    DOB: 12-19-1940, 78 y.o.   MRN: [id number]  HPI: Angela Parrish is a 78 y.o. female presenting on 07/04/2019 for Hypertension (4 week rck )  This patient comes in for 4-week recheck on her blood pressure.  She states she has been having some fairly good readings at home.  They are usually between 130 and 140.  Today in the office she was 142/67.  Her pulse was good.  She does feel good on the medication.  We will continue the medication and plan to recheck her in a few months.  Past Medical History:  Diagnosis Date  . Abnormal Pap smear    Previous colposcopy  . Arthritis    hip   . Asthma    childhood    . Breast cancer ([place]) 2000   Status post left lumpectomy  . COPD (chronic obstructive pulmonary disease) ([place])   . Dyspnea    on exertion ; improved with use Spiriva   . Headache    occ  . Hyperlipidemia   . Hypertension   . Personal history of radiation therapy 2000  . Pulmonary emboli (HCC)    Relevant past medical, surgical, family and social history reviewed and updated as indicated. Interim medical history since our last visit reviewed. Allergies and medications reviewed and updated. DATA REVIEWED: CHART IN EPIC  Family History reviewed for pertinent findings.  Review of Systems  Constitutional: Negative.   HENT: Negative.   Eyes: Negative.   Respiratory: Negative.   Gastrointestinal: Negative.   Genitourinary: Negative.     Allergies as of 07/04/2019   No Known Allergies     Medication List       Accurate as of July 04, 2019 11:59 PM. If you have any questions, ask your nurse or doctor.        acetaminophen 325 MG tablet Commonly known as: TYLENOL Take 2 tablets (650 mg total) by mouth every 6 (six) hours as needed for mild pain  (or Fever >/= 101).   albuterol 108 (90 Base) MCG/ACT inhaler Commonly known as: VENTOLIN HFA Inhale 2 puffs into the lungs every 6 (six) hours as needed for wheezing or shortness of breath. Prefers VENTOLIN   albuterol (2.5 MG/3ML) 0.083% nebulizer solution Commonly known as: PROVENTIL Take 3 mLs (2.5 mg total) by nebulization every 6 (six) hours as needed for wheezing or shortness of breath.   atorvastatin 10 MG tablet Commonly known as: LIPITOR TAKE ONE (1) TABLET EACH DAY   budesonide-formoterol 160-4.5 MCG/ACT inhaler Commonly known as: SYMBICORT Inhale 2 puffs into the lungs 2 (two) times daily.   lisinopril 5 MG tablet Commonly known as: ZESTRIL Take 1 tablet (5 mg total) by mouth daily.   tiotropium 18 MCG inhalation capsule Commonly known as: SPIRIVA Place 1 capsule (18 mcg total) into inhaler and inhale daily.          Objective:    BP (!) 142/67   Pulse 79   Temp (!) 97.4 F (36.3 C) (Temporal)   Ht 5' 2.5" (1.588 m)   Wt 152 lb 3.2 oz (69 kg)   SpO2 92%   BMI 27.39 kg/m  No Known Allergies  Wt Readings from Last 3 Encounters:  07/04/19 152 lb 3.2 oz (69 kg)  05/05/19 148 lb 9.6 oz (67.4 kg)  10/07/18 144 lb 3.2 oz (65.4 kg)    Physical Exam Constitutional:      General: She is not in acute distress.    Appearance: Normal appearance. She is well-developed.  HENT:     Head: Normocephalic and atraumatic.  Cardiovascular:     Rate and Rhythm: Normal rate.  Pulmonary:     Effort: Pulmonary effort is normal.  Skin:    General: Skin is warm and dry.     Findings: No rash.  Neurological:     Mental Status: She is alert and oriented to person, place, and time.     Deep Tendon Reflexes: Reflexes are normal and symmetric.     Results for orders placed or performed in visit on 05/21/19  Microalbumin / creatinine urine ratio  Result Value Ref Range   Creatinine, Urine 73.4 Not Estab. mg/dL   Microalbumin, Urine 54.7 Not Estab. ug/mL    Microalb/Creat Ratio 75 (H) 0 - 29 mg/g creat  BMP8+EGFR  Result Value Ref Range   Glucose 94 65 - 99 mg/dL   BUN 34 (H) 8 - 27 mg/dL   Creatinine, Ser 2.38 (H) 0.57 - 1.00 mg/dL   GFR calc non Af Amer 19 (L) >59 mL/min/1.73   GFR calc Af Amer 22 (L) >59 mL/min/1.73   BUN/Creatinine Ratio 14 12 - 28   Sodium 141 134 - 144 mmol/L   Potassium 5.0 3.5 - 5.2 mmol/L   Chloride 104 96 - 106 mmol/L   CO2 21 20 - 29 mmol/L   Calcium 9.9 8.7 - 10.3 mg/dL      Assessment & Plan:   1. Essential hypertension Continue lisinopril 5 mg 1 daily Monitor blood pressure Recheck 3 months   Continue all other maintenance medications as listed above.  Follow up plan: No follow-ups on file.  Educational handout given for Maretta Bees PA-C [facility] [address]  [place], [place] [address] [phone]   07/11/2019, 4:23 PM

## 2019-07-15 DIAGNOSIS — Z96641 Presence of right artificial hip joint: Secondary | ICD-10-CM | POA: Diagnosis not present

## 2019-07-15 DIAGNOSIS — Z471 Aftercare following joint replacement surgery: Secondary | ICD-10-CM | POA: Diagnosis not present

## 2019-07-24 ENCOUNTER — Ambulatory Visit (INDEPENDENT_AMBULATORY_CARE_PROVIDER_SITE_OTHER): Payer: Medicare HMO | Admitting: Physician Assistant

## 2019-07-24 DIAGNOSIS — I10 Essential (primary) hypertension: Secondary | ICD-10-CM | POA: Diagnosis not present

## 2019-07-24 MED ORDER — LOSARTAN POTASSIUM 25 MG PO TABS: 25.0000 mg | ORAL_TABLET | Freq: Every day | ORAL | 2 refills | Status: DC

## 2019-07-25 ENCOUNTER — Encounter: Payer: Self-pay | Admitting: Physician Assistant

## 2019-07-25 NOTE — Progress Notes (Signed)
Telephone visit  Subjective: CC: Hypertension issues PCP: Terald Sleeper, PA-C [id number] Angela Parrish is a 78 y.o. female calls for telephone consult today. Patient provides verbal consent for consult held via phone.  Patient is identified with 2 separate identifiers.  At this time the entire area is on COVID-19 social distancing and stay home orders are in place.  Patient is of higher risk and therefore we are performing this by a virtual method.  Location of patient: Home Location of provider: [facility] Others present for call: No  The patient had been taking her lisinopril 5 mg one half or 1/day.  In the recent couple weeks she has had morning readings that are generally too low.  For instance she has had 88/49, 98/57.  But in the afternoon she will have readings typically in the 150/70 range.  The highest was 167/70 last week.  Then she will not take her medicine when it is very low in the morning.  I think we will change to a different medication and see if we can get a smoother effect from the medicine.  Gabriel Cirri discontinued the lisinopril this time and start losartan at 25 mg.  I explained to her that this is a half dose of the medication to begin with.  But we can even reduce it if needed.  And we will plan to recheck her next week.  An appointment has been made.   ROS: Per HPI  No Known Allergies Past Medical History:  Diagnosis Date  . Abnormal Pap smear    Previous colposcopy  . Arthritis    hip   . Asthma    childhood    . Breast cancer ([place]) 2000   Status post left lumpectomy  . COPD (chronic obstructive pulmonary disease) ([place])   . Dyspnea    on exertion ; improved with use Spiriva   . Headache    occ  . Hyperlipidemia   . Hypertension   . Personal history of radiation therapy 2000  . Pulmonary emboli (HCC)     Current Outpatient Medications:  .  acetaminophen (TYLENOL) 325 MG tablet, Take 2 tablets (650 mg total) by mouth every 6 (six) hours as needed for  mild pain (or Fever >/= 101)., Disp: , Rfl:  .  albuterol (PROVENTIL HFA;VENTOLIN HFA) 108 (90 Base) MCG/ACT inhaler, Inhale 2 puffs into the lungs every 6 (six) hours as needed for wheezing or shortness of breath. Prefers VENTOLIN, Disp: 1 Inhaler, Rfl: 12 .  albuterol (PROVENTIL) (2.5 MG/3ML) 0.083% nebulizer solution, Take 3 mLs (2.5 mg total) by nebulization every 6 (six) hours as needed for wheezing or shortness of breath., Disp: 360 mL, Rfl: 12 .  atorvastatin (LIPITOR) 10 MG tablet, TAKE ONE (1) TABLET EACH DAY, Disp: 30 tablet, Rfl: 11 .  budesonide-formoterol (SYMBICORT) 160-4.5 MCG/ACT inhaler, Inhale 2 puffs into the lungs 2 (two) times daily., Disp: 1 Inhaler, Rfl: 12 .  losartan (COZAAR) 25 MG tablet, Take 1 tablet (25 mg total) by mouth daily., Disp: 30 tablet, Rfl: 2 .  tiotropium (SPIRIVA) 18 MCG inhalation capsule, Place 1 capsule (18 mcg total) into inhaler and inhale daily., Disp: 30 capsule, Rfl: 12  Assessment/ Plan: 79 y.o. female   1. Essential hypertension - losartan (COZAAR) 25 MG tablet; Take 1 tablet (25 mg total) by mouth daily.  Dispense: 30 tablet; Refill: 2   No follow-ups on file.  Continue all other maintenance medications as listed above.  Start time: 12:20 PM End  time: 12:30 PM  Meds ordered this encounter  Medications  . losartan (COZAAR) 25 MG tablet    Sig: Take 1 tablet (25 mg total) by mouth daily.    Dispense:  30 tablet    Refill:  2    Order Specific Question:   Supervising Provider    Answer:   [name] [[id number]]    Particia Nearing PA-C [place] [phone]

## 2019-07-28 ENCOUNTER — Other Ambulatory Visit: Payer: Self-pay

## 2019-07-29 ENCOUNTER — Ambulatory Visit (INDEPENDENT_AMBULATORY_CARE_PROVIDER_SITE_OTHER): Payer: Medicare HMO | Admitting: Physician Assistant

## 2019-07-29 ENCOUNTER — Encounter: Payer: Self-pay | Admitting: Physician Assistant

## 2019-07-29 VITALS — BP 135/70 | HR 81 | Temp 97.5°F | Ht 62.5 in | Wt 152.2 lb

## 2019-07-29 DIAGNOSIS — I10 Essential (primary) hypertension: Secondary | ICD-10-CM

## 2019-07-29 NOTE — Progress Notes (Signed)
Acute Office Visit  Subjective:    Patient ID: Angela Parrish, female    DOB: 01-25-1941, 78 y.o.   MRN: [id number]  Chief Complaint  Patient presents with  . Hypertension    Hypertension This is a recurrent problem. The current episode started more than 1 month ago. The problem is controlled. Pertinent negatives include no anxiety, chest pain, orthopnea, palpitations or shortness of breath. There are no associated agents to hypertension. There are no known risk factors for coronary artery disease. Past treatments include nothing. The current treatment provides mild improvement. There are no compliance problems.  There is no history of angina.     Past Medical History:  Diagnosis Date  . Abnormal Pap smear    Previous colposcopy  . Arthritis    hip   . Asthma    childhood    . Breast cancer ([place]) 2000   Status post left lumpectomy  . COPD (chronic obstructive pulmonary disease) ([place])   . Dyspnea    on exertion ; improved with use Spiriva   . Headache    occ  . Hyperlipidemia   . Hypertension   . Personal history of radiation therapy 2000  . Pulmonary emboli [facility])     Past Surgical History:  Procedure Laterality Date  . BREAST LUMPECTOMY Left 2000  . CATARACT EXTRACTION, BILATERAL    . COLONOSCOPY N/A 09/16/2014   Procedure: COLONOSCOPY;  Surgeon: Rogene Houston, MD;  [facility];  Service: Endoscopy;  Laterality: N/A;  830 - moved to 9:15 - Ann to notify  . JOINT REPLACEMENT  06/2018   right hip  . KNEE CARTILAGE SURGERY Right   . TOTAL HIP ARTHROPLASTY Right 07/17/2018   Procedure: RIGHT TOTAL HIP ARTHROPLASTY ANTERIOR APPROACH;  Surgeon: Gaynelle Arabian, MD;  Location: [facility];  Service: Orthopedics;  Laterality: Right;  16min    Family History  Problem Relation Age of Onset  . Heart disease Father   . Lung cancer Father        smoker  . Heart attack Father   . Colon cancer Mother 7  . Heart attack Mother   . Heart disease Sister   .  Hypertension Brother   . Heart attack Brother   . Leukemia Brother   . Obesity Daughter   . Anxiety disorder Daughter   . Hypertension Brother     Social History   Socioeconomic History  . Marital status: Widowed    Spouse name: Not on file  . Number of children: 1  . Years of education: 81  . Highest education level: Some college, no degree  Occupational History  . Occupation: Retired    Comment: [employer]  Social Needs  . Financial resource strain: Not very hard  . Food insecurity    Worry: Never true    Inability: Never true  . Transportation needs    Medical: No    Non-medical: No  Tobacco Use  . Smoking status: Former Smoker    Types: Cigarettes    Quit date: 08/14/2007    Years since quitting: 11.9  . Smokeless tobacco: Never Used  Substance and Sexual Activity  . Alcohol use: No  . Drug use: No  . Sexual activity: Yes    Birth control/protection: Post-menopausal  Lifestyle  . Physical activity    Days per week: 3 days    Minutes per session: 20 min  . Stress: Only a little  Relationships  . Social connections    Talks  on phone: More than three times a week    Gets together: More than three times a week    Attends religious service: Never    Active member of club or organization: No    Attends meetings of clubs or organizations: Never    Relationship status: Living with partner  . Intimate partner violence    Fear of current or ex partner: No    Emotionally abused: No    Physically abused: No    Forced sexual activity: No  Other Topics Concern  . Not on file  Social History Narrative  . Not on file    Outpatient Medications Prior to Visit  Medication Sig Dispense Refill  . acetaminophen (TYLENOL) 325 MG tablet Take 2 tablets (650 mg total) by mouth every 6 (six) hours as needed for mild pain (or Fever >/= 101).    Marland Kitchen albuterol (PROVENTIL HFA;VENTOLIN HFA) 108 (90 Base) MCG/ACT inhaler Inhale 2 puffs into the lungs every 6 (six) hours as needed for  wheezing or shortness of breath. Prefers VENTOLIN 1 Inhaler 12  . albuterol (PROVENTIL) (2.5 MG/3ML) 0.083% nebulizer solution Take 3 mLs (2.5 mg total) by nebulization every 6 (six) hours as needed for wheezing or shortness of breath. 360 mL 12  . atorvastatin (LIPITOR) 10 MG tablet TAKE ONE (1) TABLET EACH DAY 30 tablet 11  . budesonide-formoterol (SYMBICORT) 160-4.5 MCG/ACT inhaler Inhale 2 puffs into the lungs 2 (two) times daily. 1 Inhaler 12  . losartan (COZAAR) 25 MG tablet Take 1 tablet (25 mg total) by mouth daily. 30 tablet 2  . tiotropium (SPIRIVA) 18 MCG inhalation capsule Place 1 capsule (18 mcg total) into inhaler and inhale daily. 30 capsule 12   No facility-administered medications prior to visit.     No Known Allergies  Review of Systems  Constitutional: Negative.  Negative for chills and fever.  HENT: Negative.   Respiratory: Negative.  Negative for cough and shortness of breath.   Cardiovascular: Negative.  Negative for chest pain, palpitations and orthopnea.  Gastrointestinal: Negative.   Skin: Negative.   Neurological: Negative.        Objective:    Physical Exam  Constitutional: She is oriented to person, place, and time. She appears well-developed and well-nourished.  HENT:  Head: Normocephalic and atraumatic.  Eyes: Pupils are equal, round, and reactive to light. Conjunctivae and EOM are normal.  Cardiovascular: Normal rate, regular rhythm, normal heart sounds and intact distal pulses.  Pulmonary/Chest: Effort normal and breath sounds normal.  Abdominal: Soft. Bowel sounds are normal.  Neurological: She is alert and oriented to person, place, and time. She has normal reflexes.  Skin: Skin is warm and dry. No rash noted.  Psychiatric: She has a normal mood and affect. Her behavior is normal. Judgment and thought content normal.    BP 135/70   Pulse 81   Temp (!) 97.5 F (36.4 C) (Temporal)   Ht 5' 2.5" (1.588 m)   Wt 152 lb 3.2 oz (69 kg)   SpO2 96%    BMI 27.39 kg/m  Wt Readings from Last 3 Encounters:  07/29/19 152 lb 3.2 oz (69 kg)  07/04/19 152 lb 3.2 oz (69 kg)  05/05/19 148 lb 9.6 oz (67.4 kg)    Health Maintenance Due  Topic Date Due  . DEXA SCAN  04/28/2005    There are no preventive care reminders to display for this patient.   Lab Results  Component Value Date   TSH 4.920 (H) 05/05/2019  Lab Results  Component Value Date   WBC 7.6 05/05/2019   HGB 11.3 05/05/2019   HCT 35.1 05/05/2019   MCV 95 05/05/2019   PLT 331 05/05/2019   Lab Results  Component Value Date   NA 141 05/21/2019   K 5.0 05/21/2019   CO2 21 05/21/2019   GLUCOSE 94 05/21/2019   BUN 34 (H) 05/21/2019   CREATININE 2.38 (H) 05/21/2019   BILITOT 0.3 05/05/2019   ALKPHOS 95 05/05/2019   AST 15 05/05/2019   ALT 8 05/05/2019   PROT 6.7 05/05/2019   ALBUMIN 4.0 05/05/2019   CALCIUM 9.9 05/21/2019   ANIONGAP 9 09/22/2018   Lab Results  Component Value Date   CHOL 173 05/05/2019   Lab Results  Component Value Date   HDL 47 05/05/2019   Lab Results  Component Value Date   LDLCALC 109 (H) 05/05/2019   Lab Results  Component Value Date   TRIG 93 05/05/2019   Lab Results  Component Value Date   CHOLHDL 3.7 05/05/2019   No results found for: HGBA1C     Assessment & Plan:    1. Essential hypertension LOSARTAN 25 MG one daily Recheck 1 month  No orders of the defined types were placed in this encounter.    Terald Sleeper, PA-C

## 2019-07-30 ENCOUNTER — Other Ambulatory Visit: Payer: Medicare HMO

## 2019-07-30 ENCOUNTER — Other Ambulatory Visit: Payer: Self-pay

## 2019-07-30 DIAGNOSIS — N289 Disorder of kidney and ureter, unspecified: Secondary | ICD-10-CM

## 2019-07-30 LAB — BMP8+EGFR
BUN/Creatinine Ratio: 14 (ref 12–28)
BUN: 30 mg/dL — ABNORMAL HIGH (ref 8–27)
CO2: 21 mmol/L (ref 20–29)
Calcium: 10.1 mg/dL (ref 8.7–10.3)
Chloride: 104 mmol/L (ref 96–106)
Creatinine, Ser: 2.07 mg/dL — ABNORMAL HIGH (ref 0.57–1.00)
GFR calc Af Amer: 26 mL/min/{1.73_m2} — ABNORMAL LOW (ref >59)
GFR calc non Af Amer: 22 mL/min/{1.73_m2} — ABNORMAL LOW (ref >59)
Glucose: 99 mg/dL (ref 65–99)
Potassium: 5.6 mmol/L — ABNORMAL HIGH (ref 3.5–5.2)
Sodium: 141 mmol/L (ref 134–144)

## 2019-07-31 LAB — MICROALBUMIN / CREATININE URINE RATIO
Creatinine, Urine: 59.6 mg/dL
Microalb/Creat Ratio: 27 mg/g creat (ref 0–29)
Microalbumin, Urine: 16.2 ug/mL

## 2019-08-28 ENCOUNTER — Other Ambulatory Visit: Payer: Self-pay

## 2019-08-29 ENCOUNTER — Encounter: Payer: Self-pay | Admitting: Physician Assistant

## 2019-08-29 ENCOUNTER — Ambulatory Visit (INDEPENDENT_AMBULATORY_CARE_PROVIDER_SITE_OTHER): Payer: Medicare HMO | Admitting: Physician Assistant

## 2019-08-29 VITALS — BP 131/68 | HR 68 | Temp 97.3°F | Ht 62.5 in | Wt 154.0 lb

## 2019-08-29 DIAGNOSIS — I10 Essential (primary) hypertension: Secondary | ICD-10-CM

## 2019-08-29 DIAGNOSIS — N289 Disorder of kidney and ureter, unspecified: Secondary | ICD-10-CM

## 2019-08-30 LAB — CMP14+EGFR
ALT: 9 IU/L (ref 0–32)
AST: 17 IU/L (ref 0–40)
Albumin/Globulin Ratio: 1.6 (ref 1.2–2.2)
Albumin: 4.5 g/dL (ref 3.7–4.7)
Alkaline Phosphatase: 119 IU/L — ABNORMAL HIGH (ref 39–117)
BUN/Creatinine Ratio: 16 (ref 12–28)
BUN: 33 mg/dL — ABNORMAL HIGH (ref 8–27)
Bilirubin Total: 0.5 mg/dL (ref 0.0–1.2)
CO2: 21 mmol/L (ref 20–29)
Calcium: 9.6 mg/dL (ref 8.7–10.3)
Chloride: 103 mmol/L (ref 96–106)
Creatinine, Ser: 2.01 mg/dL — ABNORMAL HIGH (ref 0.57–1.00)
GFR calc Af Amer: 27 mL/min/{1.73_m2} — ABNORMAL LOW (ref >59)
GFR calc non Af Amer: 23 mL/min/{1.73_m2} — ABNORMAL LOW (ref >59)
Globulin, Total: 2.8 g/dL (ref 1.5–4.5)
Glucose: 87 mg/dL (ref 65–99)
Potassium: 5.8 mmol/L — ABNORMAL HIGH (ref 3.5–5.2)
Sodium: 138 mmol/L (ref 134–144)
Total Protein: 7.3 g/dL (ref 6.0–8.5)

## 2019-08-30 LAB — MICROALBUMIN / CREATININE URINE RATIO
Creatinine, Urine: 55.4 mg/dL
Microalb/Creat Ratio: 29 mg/g creat (ref 0–29)
Microalbumin, Urine: 16.2 ug/mL

## 2019-08-31 NOTE — Progress Notes (Signed)
Acute Office Visit  Subjective:    Patient ID: Angela Parrish, female    DOB: Jun 20, 1941, 79 y.o.   MRN: [id number]  Chief Complaint  Patient presents with  . Hypertension    Hypertension This is a chronic problem. The current episode started more than 1 month ago. The problem has been rapidly improving since onset. The problem is controlled. Pertinent negatives include no chest pain. Risk factors for coronary artery disease include smoking/tobacco exposure. Past treatments include angiotensin blockers. The current treatment provides significant improvement. There are no compliance problems.  There is no history of angina.     Past Medical History:  Diagnosis Date  . Abnormal Pap smear    Previous colposcopy  . Arthritis    hip   . Asthma    childhood    . Breast cancer ([place]) 2000   Status post left lumpectomy  . COPD (chronic obstructive pulmonary disease) ([place])   . Dyspnea    on exertion ; improved with use Spiriva   . Headache    occ  . Hyperlipidemia   . Personal history of radiation therapy 2000    Past Surgical History:  Procedure Laterality Date  . BREAST LUMPECTOMY Left 2000  . CATARACT EXTRACTION, BILATERAL    . COLONOSCOPY N/A 09/16/2014   Procedure: COLONOSCOPY;  Surgeon: Rogene Houston, MD;  [facility];  Service: Endoscopy;  Laterality: N/A;  830 - moved to 9:15 - Ann to notify  . JOINT REPLACEMENT  06/2018   right hip  . KNEE CARTILAGE SURGERY Right   . TOTAL HIP ARTHROPLASTY Right 07/17/2018   Procedure: RIGHT TOTAL HIP ARTHROPLASTY ANTERIOR APPROACH;  Surgeon: Gaynelle Arabian, MD;  [facility];  Service: Orthopedics;  Laterality: Right;  164mn    Family History  Problem Relation Age of Onset  . Heart disease Father   . Lung cancer Father        smoker  . Heart attack Father   . Colon cancer Mother 726 . Heart attack Mother   . Heart disease Sister   . Hypertension Brother   . Heart attack Brother   . Leukemia Brother    . Obesity Daughter   . Anxiety disorder Daughter   . Hypertension Brother     Social History   Socioeconomic History  . Marital status: Widowed    Spouse name: Not on file  . Number of children: 1  . Years of education: 12 . Highest education level: Some college, no degree  Occupational History  . Occupation: Retired    Comment: [employer]  Tobacco Use  . Smoking status: Former Smoker    Types: Cigarettes    Quit date: 08/14/2007    Years since quitting: 12.0  . Smokeless tobacco: Never Used  Substance and Sexual Activity  . Alcohol use: No  . Drug use: No  . Sexual activity: Yes    Birth control/protection: Post-menopausal  Other Topics Concern  . Not on file  Social History Narrative  . Not on file   Social Determinants of Health   Financial Resource Strain:   . Difficulty of Paying Living Expenses: Not on file  Food Insecurity:   . Worried About RCharity fundraiserin the Last Year: Not on file  . Ran Out of Food in the Last Year: Not on file  Transportation Needs:   . Lack of Transportation (Medical): Not on file  . Lack of Transportation (Non-Medical): Not on file  Physical Activity: Insufficiently Active  . Days of Exercise per Week: 3 days  . Minutes of Exercise per Session: 20 min  Stress:   . Feeling of Stress : Not on file  Social Connections:   . Frequency of Communication with Friends and Family: Not on file  . Frequency of Social Gatherings with Friends and Family: Not on file  . Attends Religious Services: Not on file  . Active Member of Clubs or Organizations: Not on file  . Attends Archivist Meetings: Not on file  . Marital Status: Not on file  Intimate Partner Violence:   . Fear of Current or Ex-Partner: Not on file  . Emotionally Abused: Not on file  . Physically Abused: Not on file  . Sexually Abused: Not on file    Outpatient Medications Prior to Visit  Medication Sig Dispense Refill  . acetaminophen (TYLENOL) 325 MG tablet  Take 2 tablets (650 mg total) by mouth every 6 (six) hours as needed for mild pain (or Fever >/= 101).    Marland Kitchen albuterol (PROVENTIL HFA;VENTOLIN HFA) 108 (90 Base) MCG/ACT inhaler Inhale 2 puffs into the lungs every 6 (six) hours as needed for wheezing or shortness of breath. Prefers VENTOLIN 1 Inhaler 12  . albuterol (PROVENTIL) (2.5 MG/3ML) 0.083% nebulizer solution Take 3 mLs (2.5 mg total) by nebulization every 6 (six) hours as needed for wheezing or shortness of breath. 360 mL 12  . atorvastatin (LIPITOR) 10 MG tablet TAKE ONE (1) TABLET EACH DAY 30 tablet 11  . budesonide-formoterol (SYMBICORT) 160-4.5 MCG/ACT inhaler Inhale 2 puffs into the lungs 2 (two) times daily. 1 Inhaler 12  . losartan (COZAAR) 25 MG tablet Take 1 tablet (25 mg total) by mouth daily. 30 tablet 2  . tiotropium (SPIRIVA) 18 MCG inhalation capsule Place 1 capsule (18 mcg total) into inhaler and inhale daily. 30 capsule 12   No facility-administered medications prior to visit.    No Known Allergies  Review of Systems  Constitutional: Negative.  Negative for activity change, fatigue and fever.  HENT: Negative.   Eyes: Negative.   Respiratory: Negative.  Negative for cough.   Cardiovascular: Negative.  Negative for chest pain.  Gastrointestinal: Negative.  Negative for abdominal pain.  Endocrine: Negative.   Genitourinary: Negative.  Negative for dysuria.  Musculoskeletal: Negative.   Skin: Negative.   Neurological: Negative.        Objective:    Physical Exam Constitutional:      General: She is not in acute distress.    Appearance: Normal appearance. She is well-developed.  HENT:     Head: Normocephalic and atraumatic.  Cardiovascular:     Rate and Rhythm: Normal rate.  Pulmonary:     Effort: Pulmonary effort is normal.  Skin:    General: Skin is warm and dry.     Findings: No rash.  Neurological:     Mental Status: She is alert and oriented to person, place, and time.     Deep Tendon Reflexes:  Reflexes are normal and symmetric.     BP 131/68   Pulse 68   Temp (!) 97.3 F (36.3 C) (Temporal)   Ht 5' 2.5" (1.588 m)   Wt 154 lb (69.9 kg)   SpO2 98%   BMI 27.72 kg/m  Wt Readings from Last 3 Encounters:  08/29/19 154 lb (69.9 kg)  07/29/19 152 lb 3.2 oz (69 kg)  07/04/19 152 lb 3.2 oz (69 kg)    Health Maintenance Due  Topic  Date Due  . DEXA SCAN  04/28/2005    There are no preventive care reminders to display for this patient.   Lab Results  Component Value Date   TSH 4.920 (H) 05/05/2019   Lab Results  Component Value Date   WBC 7.6 05/05/2019   HGB 11.3 05/05/2019   HCT 35.1 05/05/2019   MCV 95 05/05/2019   PLT 331 05/05/2019   Lab Results  Component Value Date   NA 138 08/29/2019   K 5.8 (H) 08/29/2019   CO2 21 08/29/2019   GLUCOSE 87 08/29/2019   BUN 33 (H) 08/29/2019   CREATININE 2.01 (H) 08/29/2019   BILITOT 0.5 08/29/2019   ALKPHOS 119 (H) 08/29/2019   AST 17 08/29/2019   ALT 9 08/29/2019   PROT 7.3 08/29/2019   ALBUMIN 4.5 08/29/2019   CALCIUM 9.6 08/29/2019   ANIONGAP 9 09/22/2018   Lab Results  Component Value Date   CHOL 173 05/05/2019   Lab Results  Component Value Date   HDL 47 05/05/2019   Lab Results  Component Value Date   LDLCALC 109 (H) 05/05/2019   Lab Results  Component Value Date   TRIG 93 05/05/2019   Lab Results  Component Value Date   CHOLHDL 3.7 05/05/2019   No results found for: HGBA1C     Assessment & Plan:   Problem List Items Addressed This Visit      Cardiovascular and Mediastinum   Essential hypertension   Relevant Orders   Microalbumin / creatinine urine ratio (Completed)   CMP14+EGFR (Completed)    Other Visit Diagnoses    Renal insufficiency    -  Primary   Relevant Orders   Microalbumin / creatinine urine ratio (Completed)   CMP14+EGFR (Completed)       No orders of the defined types were placed in this encounter.    Terald Sleeper, PA-C

## 2019-09-02 ENCOUNTER — Other Ambulatory Visit: Payer: Self-pay | Admitting: Physician Assistant

## 2019-09-02 DIAGNOSIS — N1831 Chronic kidney disease, stage 3a: Secondary | ICD-10-CM

## 2019-09-02 DIAGNOSIS — R944 Abnormal results of kidney function studies: Secondary | ICD-10-CM

## 2019-09-09 ENCOUNTER — Telehealth: Payer: Self-pay | Admitting: Physician Assistant

## 2019-09-09 NOTE — Telephone Encounter (Signed)
Pt advised we are sending her to the specialist to evaluate why her kidney function has been impaired. Further testing and evaluation is needed to decide if treatment/anything further is needed. Pt voiced understanding.

## 2019-09-11 ENCOUNTER — Telehealth: Payer: Self-pay | Admitting: Physician Assistant

## 2019-09-11 NOTE — Telephone Encounter (Signed)
Patient talked to referrals about her referral to the kidney doctor

## 2019-09-16 ENCOUNTER — Encounter (INDEPENDENT_AMBULATORY_CARE_PROVIDER_SITE_OTHER): Payer: Self-pay | Admitting: *Deleted

## 2019-09-17 ENCOUNTER — Ambulatory Visit (INDEPENDENT_AMBULATORY_CARE_PROVIDER_SITE_OTHER): Payer: Medicare HMO | Admitting: Physician Assistant

## 2019-09-17 DIAGNOSIS — I10 Essential (primary) hypertension: Secondary | ICD-10-CM

## 2019-09-17 DIAGNOSIS — N1832 Chronic kidney disease, stage 3b: Secondary | ICD-10-CM | POA: Diagnosis not present

## 2019-09-17 DIAGNOSIS — I129 Hypertensive chronic kidney disease with stage 1 through stage 4 chronic kidney disease, or unspecified chronic kidney disease: Secondary | ICD-10-CM | POA: Diagnosis not present

## 2019-09-17 NOTE — Progress Notes (Signed)
      Telephone visit  Subjective: CC: Abnormal renal function, fluctuating blood pressure PCP: Terald Sleeper, PA-C TKZ:[id number] Angela Parrish is a 79 y.o. female calls for telephone consult today. Patient provides verbal consent for consult held via phone.  Patient is identified with 2 separate identifiers.  At this time the entire area is on COVID-19 social distancing and stay home orders are in place.  Patient is of higher risk and therefore we are performing this by a virtual method.  Location of patient: Home Location of provider: HOME Others present for call: None  The patient is having a telephone visit for follow-up on her recent kidney abnormalities.  We have been following it for a few months and there is not been an acute change.  It has been fairly stable.  Her microalbumin was normal.  However the BUN and creatinine have continued to go up over the past year  Her recent blood pressure readings will be slightly hypertensive in the morning but in the afternoon will be 130-1 forties over sixties.  She denies any chest pain or shortness of breath.  Addition she understands why we want her to have referral to nephrology for a long time maintenance   ROS: Per HPI  No Known Allergies Past Medical History:  Diagnosis Date  . Abnormal Pap smear    Previous colposcopy  . Arthritis    hip   . Asthma    childhood    . Breast cancer ([place]) 2000   Status post left lumpectomy  . COPD (chronic obstructive pulmonary disease) ([place])   . Dyspnea    on exertion ; improved with use Spiriva   . Headache    occ  . Hyperlipidemia   . Personal history of radiation therapy 2000    Current Outpatient Medications:  .  acetaminophen (TYLENOL) 325 MG tablet, Take 2 tablets (650 mg total) by mouth every 6 (six) hours as needed for mild pain (or Fever >/= 101)., Disp: , Rfl:  .  albuterol (PROVENTIL HFA;VENTOLIN HFA) 108 (90 Base) MCG/ACT inhaler, Inhale 2 puffs into the lungs every 6 (six)  hours as needed for wheezing or shortness of breath. Prefers VENTOLIN, Disp: 1 Inhaler, Rfl: 12 .  albuterol (PROVENTIL) (2.5 MG/3ML) 0.083% nebulizer solution, Take 3 mLs (2.5 mg total) by nebulization every 6 (six) hours as needed for wheezing or shortness of breath., Disp: 360 mL, Rfl: 12 .  atorvastatin (LIPITOR) 10 MG tablet, TAKE ONE (1) TABLET EACH DAY, Disp: 30 tablet, Rfl: 11 .  budesonide-formoterol (SYMBICORT) 160-4.5 MCG/ACT inhaler, Inhale 2 puffs into the lungs 2 (two) times daily., Disp: 1 Inhaler, Rfl: 12 .  losartan (COZAAR) 25 MG tablet, Take 1 tablet (25 mg total) by mouth daily., Disp: 30 tablet, Rfl: 2 .  tiotropium (SPIRIVA) 18 MCG inhalation capsule, Place 1 capsule (18 mcg total) into inhaler and inhale daily., Disp: 30 capsule, Rfl: 12  Assessment/ Plan: 79 y.o. female   1. Stage 3b chronic kidney disease Nephrology referral  2. Essential hypertension Nephrology will follow   No follow-ups on file.  Continue all other maintenance medications as listed above.  Start time: 11:39 AM End time: 11:50 AM  No orders of the defined types were placed in this encounter.   Particia Nearing PA-C [place] [phone]

## 2019-09-18 ENCOUNTER — Encounter: Payer: Self-pay | Admitting: Physician Assistant

## 2019-09-22 ENCOUNTER — Other Ambulatory Visit: Payer: Self-pay | Admitting: Physician Assistant

## 2019-09-22 DIAGNOSIS — I10 Essential (primary) hypertension: Secondary | ICD-10-CM

## 2019-09-30 DIAGNOSIS — I129 Hypertensive chronic kidney disease with stage 1 through stage 4 chronic kidney disease, or unspecified chronic kidney disease: Secondary | ICD-10-CM | POA: Diagnosis not present

## 2019-09-30 DIAGNOSIS — N184 Chronic kidney disease, stage 4 (severe): Secondary | ICD-10-CM | POA: Diagnosis not present

## 2019-10-02 ENCOUNTER — Other Ambulatory Visit: Payer: Self-pay | Admitting: Nephrology

## 2019-10-02 DIAGNOSIS — N184 Chronic kidney disease, stage 4 (severe): Secondary | ICD-10-CM

## 2019-10-08 ENCOUNTER — Ambulatory Visit
Admission: RE | Admit: 2019-10-08 | Discharge: 2019-10-08 | Disposition: A | Discharge status: Home / Self Care | Payer: Medicare HMO | Source: Ambulatory Visit | Attending: Nephrology | Admitting: Nephrology

## 2019-10-08 DIAGNOSIS — N184 Chronic kidney disease, stage 4 (severe): Secondary | ICD-10-CM

## 2019-10-08 DIAGNOSIS — N189 Chronic kidney disease, unspecified: Secondary | ICD-10-CM | POA: Diagnosis not present

## 2019-11-04 ENCOUNTER — Ambulatory Visit: Payer: Medicare HMO | Admitting: Physician Assistant

## 2019-11-10 DIAGNOSIS — Z0289 Encounter for other administrative examinations: Secondary | ICD-10-CM

## 2019-11-28 DIAGNOSIS — I129 Hypertensive chronic kidney disease with stage 1 through stage 4 chronic kidney disease, or unspecified chronic kidney disease: Secondary | ICD-10-CM | POA: Diagnosis not present

## 2019-11-28 DIAGNOSIS — N184 Chronic kidney disease, stage 4 (severe): Secondary | ICD-10-CM | POA: Diagnosis not present

## 2019-12-08 ENCOUNTER — Encounter: Payer: Self-pay | Admitting: *Deleted

## 2020-02-19 DIAGNOSIS — I129 Hypertensive chronic kidney disease with stage 1 through stage 4 chronic kidney disease, or unspecified chronic kidney disease: Secondary | ICD-10-CM | POA: Diagnosis not present

## 2020-02-19 DIAGNOSIS — N184 Chronic kidney disease, stage 4 (severe): Secondary | ICD-10-CM | POA: Diagnosis not present

## 2020-02-27 ENCOUNTER — Ambulatory Visit: Payer: Medicare HMO | Admitting: Physician Assistant

## 2020-02-27 ENCOUNTER — Other Ambulatory Visit: Payer: Self-pay

## 2020-02-27 ENCOUNTER — Ambulatory Visit (INDEPENDENT_AMBULATORY_CARE_PROVIDER_SITE_OTHER): Payer: Medicare HMO | Admitting: Family

## 2020-02-27 ENCOUNTER — Encounter: Payer: Self-pay | Admitting: Family

## 2020-02-27 VITALS — BP 119/61 | HR 86 | Temp 97.4°F | Ht 62.5 in | Wt 153.2 lb

## 2020-02-27 DIAGNOSIS — Z780 Asymptomatic menopausal state: Secondary | ICD-10-CM

## 2020-02-27 DIAGNOSIS — J410 Simple chronic bronchitis: Secondary | ICD-10-CM

## 2020-02-27 DIAGNOSIS — N184 Chronic kidney disease, stage 4 (severe): Secondary | ICD-10-CM | POA: Diagnosis not present

## 2020-02-27 DIAGNOSIS — I10 Essential (primary) hypertension: Secondary | ICD-10-CM | POA: Diagnosis not present

## 2020-02-27 DIAGNOSIS — E7841 Elevated Lipoprotein(a): Secondary | ICD-10-CM | POA: Diagnosis not present

## 2020-02-27 MED ORDER — ATORVASTATIN CALCIUM 10 MG PO TABS: ORAL_TABLET | ORAL | 3 refills | Status: DC

## 2020-02-27 MED ORDER — ALBUTEROL SULFATE HFA 108 (90 BASE) MCG/ACT IN AERS: 2.0000 | INHALATION_SPRAY | Freq: Four times a day (QID) | RESPIRATORY_TRACT | 2 refills | Status: DC | PRN

## 2020-02-27 MED ORDER — TIOTROPIUM BROMIDE MONOHYDRATE 18 MCG IN CAPS: 18.0000 ug | ORAL_CAPSULE | Freq: Every day | RESPIRATORY_TRACT | 3 refills | Status: DC

## 2020-02-27 MED ORDER — BUDESONIDE-FORMOTEROL FUMARATE 160-4.5 MCG/ACT IN AERO: 2.0000 | INHALATION_SPRAY | Freq: Two times a day (BID) | RESPIRATORY_TRACT | 12 refills | Status: DC

## 2020-02-27 MED ORDER — LOSARTAN POTASSIUM 25 MG PO TABS: ORAL_TABLET | ORAL | 3 refills | Status: DC

## 2020-02-27 NOTE — Progress Notes (Signed)
Subjective:    Patient ID: Angela Parrish, female    DOB: Jul 25, 1941, 79 y.o.   MRN: [id number]  Chief Complaint  Patient presents with  . Medical Management of Chronic Issues    [name] patient, 6 mth    Pt presents to the office today to establish care with me. She is followed by nephrologists every 4 months for CKD.  Hypertension This is a chronic problem. The current episode started more than 1 year ago. The problem has been resolved since onset. The problem is controlled. Associated symptoms include shortness of breath. Pertinent negatives include no malaise/fatigue or peripheral edema. Risk factors for coronary artery disease include sedentary lifestyle and dyslipidemia. The current treatment provides moderate improvement. Hypertensive end-organ damage includes kidney disease. There is no history of CAD/MI or CVA.  Hyperlipidemia This is a chronic problem. The current episode started more than 1 year ago. The problem is uncontrolled. Recent lipid tests were reviewed and are high. Associated symptoms include shortness of breath. Current antihyperlipidemic treatment includes statins. The current treatment provides moderate improvement of lipids. Risk factors for coronary artery disease include dyslipidemia, hypertension and post-menopausal.  COPD PT does not currently smoke, quit 13 years ago. Uses Spiriva daily and Symbicort BID. Uses albuterol as needed about 1-2 times a week.     Review of Systems  Constitutional: Negative for malaise/fatigue.  Respiratory: Positive for shortness of breath.   All other systems reviewed and are negative.      Objective:   Physical Exam Vitals reviewed.  Constitutional:      General: She is not in acute distress.    Appearance: She is well-developed.  HENT:     Head: Normocephalic and atraumatic.     Right Ear: Tympanic membrane normal.     Left Ear: Tympanic membrane normal.  Eyes:     Pupils: Pupils are equal, round, and reactive to  light.  Neck:     Thyroid: No thyromegaly.  Cardiovascular:     Rate and Rhythm: Normal rate and regular rhythm.     Heart sounds: Normal heart sounds. No murmur heard.   Pulmonary:     Effort: Pulmonary effort is normal. No respiratory distress.     Breath sounds: Normal breath sounds. No wheezing.  Abdominal:     General: Bowel sounds are normal. There is no distension.     Palpations: Abdomen is soft.     Tenderness: There is no abdominal tenderness.  Musculoskeletal:        General: No tenderness. Normal range of motion.     Cervical back: Normal range of motion and neck supple.  Skin:    General: Skin is warm and dry.  Neurological:     Mental Status: She is alert and oriented to person, place, and time.     Cranial Nerves: No cranial nerve deficit.     Deep Tendon Reflexes: Reflexes are normal and symmetric.  Psychiatric:        Behavior: Behavior normal.        Thought Content: Thought content normal.        Judgment: Judgment normal.      BP 119/61   Pulse 86   Temp (!) 97.4 F (36.3 C) (Temporal)   Ht 5' 2.5" (1.588 m)   Wt 153 lb 3.2 oz (69.5 kg)   BMI 27.57 kg/m       Assessment & Plan:  [name] comes in today with chief complaint of Medical Management of  Chronic Issues [name] patient, 6 mth )   Diagnosis and orders addressed:  1. Elevated lipoprotein(a) - atorvastatin (LIPITOR) 10 MG tablet; TAKE ONE (1) TABLET EACH DAY  Dispense: 90 tablet; Refill: 3 - CMP14+EGFR - CBC with Differential/Platelet  2. Essential hypertension - losartan (COZAAR) 25 MG tablet; TAKE ONE (1) TABLET EACH DAY  Dispense: 90 tablet; Refill: 3 - CMP14+EGFR - CBC with Differential/Platelet  3. Simple chronic bronchitis (HCC) - tiotropium (SPIRIVA) 18 MCG inhalation capsule; Place 1 capsule (18 mcg total) into inhaler and inhale daily.  Dispense: 90 capsule; Refill: 3 - budesonide-formoterol (SYMBICORT) 160-4.5 MCG/ACT inhaler; Inhale 2 puffs into the lungs 2  (two) times daily.  Dispense: 1 Inhaler; Refill: 12 - albuterol (VENTOLIN HFA) 108 (90 Base) MCG/ACT inhaler; Inhale 2 puffs into the lungs every 6 (six) hours as needed for wheezing or shortness of breath. Prefers VENTOLIN  Dispense: 8 g; Refill: 2 - CMP14+EGFR - CBC with Differential/Platelet  4. Stage 4 chronic kidney disease (HCC) - CMP14+EGFR - CBC with Differential/Platelet  5. Post-menopausal - CMP14+EGFR - CBC with Differential/Platelet - DG [facility] DEXA   Labs pending Health Maintenance reviewed Diet and exercise encouraged  Follow up plan: 6 months    Evelina Dun, FNP

## 2020-02-27 NOTE — Patient Instructions (Signed)
Health Maintenance After Age 80 After age 80, you are at a higher risk for certain long-term diseases and infections as well as injuries from falls. Falls are a major cause of broken bones and head injuries in people who are older than age 80. Getting regular preventive care can help to keep you healthy and well. Preventive care includes getting regular testing and making lifestyle changes as recommended by your health care provider. Talk with your health care provider about:  Which screenings and tests you should have. A screening is a test that checks for a disease when you have no symptoms.  A diet and exercise plan that is right for you. What should I know about screenings and tests to prevent falls? Screening and testing are the best ways to find a health problem early. Early diagnosis and treatment give you the best chance of managing medical conditions that are common after age 80. Certain conditions and lifestyle choices may make you more likely to have a fall. Your health care provider may recommend:  Regular vision checks. Poor vision and conditions such as cataracts can make you more likely to have a fall. If you wear glasses, make sure to get your prescription updated if your vision changes.  Medicine review. Work with your health care provider to regularly review all of the medicines you are taking, including over-the-counter medicines. Ask your health care provider about any side effects that may make you more likely to have a fall. Tell your health care provider if any medicines that you take make you feel dizzy or sleepy.  Osteoporosis screening. Osteoporosis is a condition that causes the bones to get weaker. This can make the bones weak and cause them to break more easily.  Blood pressure screening. Blood pressure changes and medicines to control blood pressure can make you feel dizzy.  Strength and balance checks. Your health care provider may recommend certain tests to check your  strength and balance while standing, walking, or changing positions.  Foot health exam. Foot pain and numbness, as well as not wearing proper footwear, can make you more likely to have a fall.  Depression screening. You may be more likely to have a fall if you have a fear of falling, feel emotionally low, or feel unable to do activities that you used to do.  Alcohol use screening. Using too much alcohol can affect your balance and may make you more likely to have a fall. What actions can I take to lower my risk of falls? General instructions  Talk with your health care provider about your risks for falling. Tell your health care provider if: ? You fall. Be sure to tell your health care provider about all falls, even ones that seem minor. ? You feel dizzy, sleepy, or off-balance.  Take over-the-counter and prescription medicines only as told by your health care provider. These include any supplements.  Eat a healthy diet and maintain a healthy weight. A healthy diet includes low-fat dairy products, low-fat (lean) meats, and fiber from whole grains, beans, and lots of fruits and vegetables. Home safety  Remove any tripping hazards, such as rugs, cords, and clutter.  Install safety equipment such as grab bars in bathrooms and safety rails on stairs.  Keep rooms and walkways well-lit. Activity   Follow a regular exercise program to stay fit. This will help you maintain your balance. Ask your health care provider what types of exercise are appropriate for you.  If you need a cane or   walker, use it as recommended by your health care provider.  Wear supportive shoes that have nonskid soles. Lifestyle  Do not drink alcohol if your health care provider tells you not to drink.  If you drink alcohol, limit how much you have: ? 0-1 drink a day for women. ? 0-2 drinks a day for men.  Be aware of how much alcohol is in your drink. In the [place] one drink equals one typical bottle of beer (12  oz), one-half glass of wine (5 oz), or one shot of hard liquor (1 oz).  Do not use any products that contain nicotine or tobacco, such as cigarettes and e-cigarettes. If you need help quitting, ask your health care provider. Summary  Having a healthy lifestyle and getting preventive care can help to protect your health and wellness after age 80.  Screening and testing are the best way to find a health problem early and help you avoid having a fall. Early diagnosis and treatment give you the best chance for managing medical conditions that are more common for people who are older than age 80.  Falls are a major cause of broken bones and head injuries in people who are older than age 80. Take precautions to prevent a fall at home.  Work with your health care provider to learn what changes you can make to improve your health and wellness and to prevent falls. This information is not intended to replace advice given to you by your health care provider. Make sure you discuss any questions you have with your health care provider. Document Revised: 11/28/2018 Document Reviewed: 06/20/2017 Elsevier Patient Education  2020 [employer]vier Inc.  

## 2020-02-28 LAB — CBC WITH DIFFERENTIAL/PLATELET
Basophils Absolute: 0.1 10*3/uL (ref 0.0–0.2)
Basos: 1 %
EOS (ABSOLUTE): 0.7 10*3/uL — ABNORMAL HIGH (ref 0.0–0.4)
Eos: 8 %
Hematocrit: 37 % (ref 34.0–46.6)
Hemoglobin: 12.5 g/dL (ref 11.1–15.9)
Immature Grans (Abs): 0 10*3/uL (ref 0.0–0.1)
Immature Granulocytes: 1 %
Lymphocytes Absolute: 1.6 10*3/uL (ref 0.7–3.1)
Lymphs: 19 %
MCH: 31.8 pg (ref 26.6–33.0)
MCHC: 33.8 g/dL (ref 31.5–35.7)
MCV: 94 fL (ref 79–97)
Monocytes Absolute: 0.7 10*3/uL (ref 0.1–0.9)
Monocytes: 9 %
Neutrophils Absolute: 5.1 10*3/uL (ref 1.4–7.0)
Neutrophils: 62 %
Platelets: 239 10*3/uL (ref 150–450)
RBC: 3.93 x10E6/uL (ref 3.77–5.28)
RDW: 12.1 % (ref 11.7–15.4)
WBC: 8.2 10*3/uL (ref 3.4–10.8)

## 2020-02-28 LAB — CMP14+EGFR
ALT: 13 IU/L (ref 0–32)
AST: 25 IU/L (ref 0–40)
Albumin/Globulin Ratio: 1.5 (ref 1.2–2.2)
Albumin: 4.4 g/dL (ref 3.7–4.7)
Alkaline Phosphatase: 115 IU/L (ref 48–121)
BUN/Creatinine Ratio: 15 (ref 12–28)
BUN: 30 mg/dL — ABNORMAL HIGH (ref 8–27)
Bilirubin Total: 0.5 mg/dL (ref 0.0–1.2)
CO2: 20 mmol/L (ref 20–29)
Calcium: 10.4 mg/dL — ABNORMAL HIGH (ref 8.7–10.3)
Chloride: 102 mmol/L (ref 96–106)
Creatinine, Ser: 2.05 mg/dL — ABNORMAL HIGH (ref 0.57–1.00)
GFR calc Af Amer: 26 mL/min/{1.73_m2} — ABNORMAL LOW (ref >59)
GFR calc non Af Amer: 23 mL/min/{1.73_m2} — ABNORMAL LOW (ref >59)
Globulin, Total: 2.9 g/dL (ref 1.5–4.5)
Glucose: 102 mg/dL — ABNORMAL HIGH (ref 65–99)
Potassium: 5.3 mmol/L — ABNORMAL HIGH (ref 3.5–5.2)
Sodium: 137 mmol/L (ref 134–144)
Total Protein: 7.3 g/dL (ref 6.0–8.5)

## 2020-03-02 ENCOUNTER — Telehealth: Payer: Self-pay | Admitting: Family Medicine

## 2020-03-02 NOTE — Telephone Encounter (Signed)
Reviewed results with pt .

## 2020-03-12 ENCOUNTER — Other Ambulatory Visit: Payer: Self-pay | Admitting: *Deleted

## 2020-03-12 DIAGNOSIS — J410 Simple chronic bronchitis: Secondary | ICD-10-CM

## 2020-03-12 DIAGNOSIS — I10 Essential (primary) hypertension: Secondary | ICD-10-CM

## 2020-03-12 DIAGNOSIS — E7841 Elevated Lipoprotein(a): Secondary | ICD-10-CM

## 2020-03-12 MED ORDER — TIOTROPIUM BROMIDE MONOHYDRATE 18 MCG IN CAPS: 18.0000 ug | ORAL_CAPSULE | Freq: Every day | RESPIRATORY_TRACT | 1 refills | Status: DC

## 2020-03-12 MED ORDER — ALBUTEROL SULFATE HFA 108 (90 BASE) MCG/ACT IN AERS: 2.0000 | INHALATION_SPRAY | Freq: Four times a day (QID) | RESPIRATORY_TRACT | 2 refills | Status: DC | PRN

## 2020-03-12 MED ORDER — LOSARTAN POTASSIUM 25 MG PO TABS: ORAL_TABLET | ORAL | 1 refills | Status: DC

## 2020-03-17 ENCOUNTER — Other Ambulatory Visit: Payer: Self-pay | Admitting: Family Medicine

## 2020-03-17 DIAGNOSIS — E7841 Elevated Lipoprotein(a): Secondary | ICD-10-CM

## 2020-03-17 DIAGNOSIS — J410 Simple chronic bronchitis: Secondary | ICD-10-CM

## 2020-03-17 MED ORDER — ATORVASTATIN CALCIUM 10 MG PO TABS: ORAL_TABLET | ORAL | 3 refills | Status: DC

## 2020-03-17 MED ORDER — BUDESONIDE-FORMOTEROL FUMARATE 160-4.5 MCG/ACT IN AERO: 2.0000 | INHALATION_SPRAY | Freq: Two times a day (BID) | RESPIRATORY_TRACT | 3 refills | Status: DC

## 2020-03-17 NOTE — Telephone Encounter (Signed)
Atorvastatin and Symbicort sent today Others were received and shipped out already

## 2020-03-17 NOTE — Telephone Encounter (Signed)
  Prescription Request  03/17/2020  What is the name of the medication or equipment? Albuterol 2.5 MG/39ml, Atorvastatin 10 mg, Losartan 25 mg, Tiotropium 18 MCG, Budesonide-formoterol 160-4.5 MG #90  Have you contacted your pharmacy to request a refill? (if applicable) yes  Which pharmacy would you like this sent to? [facility]   Patient notified that their request is being sent to the clinical staff for review and that they should receive a response within 2 business days.

## 2020-03-23 ENCOUNTER — Other Ambulatory Visit: Payer: Self-pay | Admitting: *Deleted

## 2020-03-23 DIAGNOSIS — E7841 Elevated Lipoprotein(a): Secondary | ICD-10-CM

## 2020-03-23 DIAGNOSIS — J410 Simple chronic bronchitis: Secondary | ICD-10-CM

## 2020-03-23 MED ORDER — ALBUTEROL SULFATE (2.5 MG/3ML) 0.083% IN NEBU: 2.5000 mg | INHALATION_SOLUTION | Freq: Four times a day (QID) | RESPIRATORY_TRACT | 12 refills | Status: DC | PRN

## 2020-04-01 DIAGNOSIS — H40033 Anatomical narrow angle, bilateral: Secondary | ICD-10-CM | POA: Diagnosis not present

## 2020-04-01 DIAGNOSIS — G44219 Episodic tension-type headache, not intractable: Secondary | ICD-10-CM | POA: Diagnosis not present

## 2020-04-02 ENCOUNTER — Other Ambulatory Visit: Payer: Self-pay | Admitting: Family Medicine

## 2020-04-02 DIAGNOSIS — Z1231 Encounter for screening mammogram for malignant neoplasm of breast: Secondary | ICD-10-CM

## 2020-05-07 ENCOUNTER — Other Ambulatory Visit: Payer: Self-pay | Admitting: Family Medicine

## 2020-05-07 DIAGNOSIS — J410 Simple chronic bronchitis: Secondary | ICD-10-CM

## 2020-05-07 DIAGNOSIS — E7841 Elevated Lipoprotein(a): Secondary | ICD-10-CM

## 2020-05-07 MED ORDER — BUDESONIDE-FORMOTEROL FUMARATE 160-4.5 MCG/ACT IN AERO: 2.0000 | INHALATION_SPRAY | Freq: Two times a day (BID) | RESPIRATORY_TRACT | 2 refills | Status: DC

## 2020-05-07 MED ORDER — ATORVASTATIN CALCIUM 10 MG PO TABS: ORAL_TABLET | ORAL | 2 refills | Status: DC

## 2020-05-07 NOTE — Telephone Encounter (Signed)
  Prescription Request  05/07/2020  What is the name of the medication or equipment? Atorvastatin 10 mg, Budesonide-formoterol 160-4.5. 90 day supply  Have you contacted your pharmacy to request a refill? (if applicable) Yes  Which pharmacy would you like this sent to? Switching to [facility]   Patient notified that their request is being sent to the clinical staff for review and that they should receive a response within 2 business days.

## 2020-05-07 NOTE — Telephone Encounter (Signed)
NA/NVM refill sent to mail order pharmacy

## 2020-05-14 ENCOUNTER — Ambulatory Visit
Admission: RE | Admit: 2020-05-14 | Discharge: 2020-05-14 | Disposition: A | Discharge status: Home / Self Care | Payer: Medicare HMO | Source: Ambulatory Visit | Attending: Family Medicine | Admitting: Family Medicine

## 2020-05-14 ENCOUNTER — Other Ambulatory Visit: Payer: Self-pay

## 2020-05-14 DIAGNOSIS — Z1231 Encounter for screening mammogram for malignant neoplasm of breast: Secondary | ICD-10-CM | POA: Diagnosis not present

## 2020-05-26 ENCOUNTER — Ambulatory Visit (INDEPENDENT_AMBULATORY_CARE_PROVIDER_SITE_OTHER): Payer: Medicare HMO

## 2020-05-26 ENCOUNTER — Ambulatory Visit (INDEPENDENT_AMBULATORY_CARE_PROVIDER_SITE_OTHER): Payer: Medicare HMO | Admitting: *Deleted

## 2020-05-26 ENCOUNTER — Other Ambulatory Visit: Payer: Self-pay

## 2020-05-26 DIAGNOSIS — Z780 Asymptomatic menopausal state: Secondary | ICD-10-CM

## 2020-05-26 DIAGNOSIS — M8589 Other specified disorders of bone density and structure, multiple sites: Secondary | ICD-10-CM | POA: Diagnosis not present

## 2020-05-26 DIAGNOSIS — Z23 Encounter for immunization: Secondary | ICD-10-CM

## 2020-05-26 DIAGNOSIS — M810 Age-related osteoporosis without current pathological fracture: Secondary | ICD-10-CM | POA: Diagnosis not present

## 2020-05-28 ENCOUNTER — Other Ambulatory Visit: Payer: Self-pay | Admitting: Family

## 2020-05-28 MED ORDER — ALENDRONATE SODIUM 70 MG PO TABS: 70.0000 mg | ORAL_TABLET | ORAL | 11 refills | Status: DC

## 2020-05-31 ENCOUNTER — Telehealth: Payer: Self-pay | Admitting: Pharmacist

## 2020-05-31 ENCOUNTER — Ambulatory Visit (INDEPENDENT_AMBULATORY_CARE_PROVIDER_SITE_OTHER): Payer: Medicare HMO | Admitting: Pharmacist

## 2020-05-31 DIAGNOSIS — M810 Age-related osteoporosis without current pathological fracture: Secondary | ICD-10-CM

## 2020-05-31 DIAGNOSIS — N184 Chronic kidney disease, stage 4 (severe): Secondary | ICD-10-CM

## 2020-05-31 NOTE — Telephone Encounter (Signed)
Totally agree with discontinuation.  I haven't had the pleasure of meeting her yet.  I agree with Prolia if she is willing.

## 2020-05-31 NOTE — Telephone Encounter (Signed)
FINISHED APPT TODAY WITH PATIENT DI:[id number] Fosamax was called in previously, however patient with most recent GFR 23 (02/27/2020).  Instructed patient not to take.  Would recommend Prolia T-score -3.2 for forearm/FRACTURE risk is high.  Patient is not keen on injection, however would like to know cost before starting.  She will continue to think about.  Also instructed patient to start calcium and vitamin D , dietary recs, exercises, etc.

## 2020-05-31 NOTE — Progress Notes (Signed)
    05/31/2020 Name: Angela Parrish MRN: [id number] DOB: February 06, 1941  S:  81 yoF Presents for osteoporosis clinic for education and management  Current Height:   5'2"     Max Lifetime Height: 5'2" Current Weight:    153lb     Ethnicity:Caucasian     HPI: Does pt already have a diagnosis of:  Osteopenia?  No Osteoporosis?  No  Back Pain?  Yes       Kyphosis?  No Prior fracture?  Yes --foot fracture Med(s) for Osteoporosis/Osteopenia:  none Med(s) previously tried for Osteoporosis/Osteopenia:  none                                                             PMH: HRT? No Steroid Use?  No Thyroid med?  No History of cancer?  Yes, breast cancer 2000 History of digestive disorders (ie Crohn's)?  No Current or previous eating disorders?  No Last Vitamin D Result: ordered Last GFR Result:  23 (02/27/20)   FH/SH: Family history of osteoporosis?  No Parent with history of hip fracture?  Yes  Family history of breast cancer?  No Exercise?  No Smoking?  No Alcohol?  No    Calcium Assessment Calcium Intake  # of servings/day  Calcium mg  Milk (8 oz) 1  x  300  = 300  Yogurt (4 oz) 0 x  200 = 0  Cheese (1 oz) 2 x  200 = 400  Other Calcium sources tums  250mg   Ca supplement 0 = 0   Estimated calcium intake per day 950   FRAX 10 year estimate: World Health Organization FRAX assessment of absolute fracture risk is not calculated for this patient because the patient has osteoporosis.  DEXA 2021 results AP LUMBAR SPINE L1, L2 and L4. Bone Mineral Density (BMD):  0.951 g/cm2 Young Adult T-Score:  -1.9  LEFT FEMUR NECK Bone Mineral Density (BMD):  0.817 g/cm2 Young Adult T-Score: -1.6  RIGHT FOREARM (1/3 RADIUS) Bone Mineral Density (BMD):  0.601 Young Adult T Score:  -3.2  Assessment: Patient's diagnostic category is OSTEOPOROSIS by WHO Criteria.  Recommendations:  1.  Consider prolia--will have to explore cost. RX sent to PCP to determine benefits.  Cannot do  bisphosphontate due to GFR 22 2.  recommend calcium 1200mg  daily through supplementation or diet. Recommend vitamin D (needs level, ordered) 3.  recommend weight bearing exercise - 30 minutes at least 4 days per week.  COPD does inhibit exercise 4.  Counseled and educated about fall risk and prevention.  Recheck DEXA:  2 years  Time spent counseling patient:  15 minutes   Regina Eck, PharmD, BCPS Clinical Pharmacist, [place]  [facility] Phone [phone]

## 2020-06-01 MED ORDER — DENOSUMAB 60 MG/ML ~~LOC~~ SOSY: 60.0000 mg | PREFILLED_SYRINGE | Freq: Once | SUBCUTANEOUS | Status: DC

## 2020-06-01 NOTE — Telephone Encounter (Signed)
Okay! Can you send Prolia RX to explore benefits, etc?  Thank you!

## 2020-06-01 NOTE — Telephone Encounter (Signed)
Ordered. I'd like her to come get a Vit D Level before she starts anything.  Will cc to Pandora Leiter to submit to ins.

## 2020-06-25 ENCOUNTER — Telehealth: Payer: Self-pay | Admitting: Family Medicine

## 2020-06-25 DIAGNOSIS — I10 Essential (primary) hypertension: Secondary | ICD-10-CM

## 2020-06-25 DIAGNOSIS — J410 Simple chronic bronchitis: Secondary | ICD-10-CM

## 2020-06-25 MED ORDER — TIOTROPIUM BROMIDE MONOHYDRATE 18 MCG IN CAPS: 18.0000 ug | ORAL_CAPSULE | Freq: Every day | RESPIRATORY_TRACT | 1 refills | Status: DC

## 2020-06-25 MED ORDER — LOSARTAN POTASSIUM 25 MG PO TABS: ORAL_TABLET | ORAL | 1 refills | Status: DC

## 2020-06-25 NOTE — Telephone Encounter (Signed)
Pt said [facility] pharmacy needs to be faxed an updated prescription for losartan and spiriva

## 2020-06-28 DIAGNOSIS — N184 Chronic kidney disease, stage 4 (severe): Secondary | ICD-10-CM | POA: Diagnosis not present

## 2020-06-28 DIAGNOSIS — I129 Hypertensive chronic kidney disease with stage 1 through stage 4 chronic kidney disease, or unspecified chronic kidney disease: Secondary | ICD-10-CM | POA: Diagnosis not present

## 2020-07-16 ENCOUNTER — Other Ambulatory Visit: Payer: Self-pay | Admitting: Family Medicine

## 2020-07-16 DIAGNOSIS — J410 Simple chronic bronchitis: Secondary | ICD-10-CM

## 2020-07-21 NOTE — Telephone Encounter (Signed)
Spoke with pt and she does not want to take prolia at this time. I explained benefits of injection, still declined.

## 2020-08-06 ENCOUNTER — Other Ambulatory Visit: Payer: Self-pay | Admitting: Family Medicine

## 2020-08-06 DIAGNOSIS — J410 Simple chronic bronchitis: Secondary | ICD-10-CM

## 2020-08-25 ENCOUNTER — Other Ambulatory Visit: Payer: Self-pay | Admitting: Family Medicine

## 2020-08-25 DIAGNOSIS — J410 Simple chronic bronchitis: Secondary | ICD-10-CM

## 2020-08-30 ENCOUNTER — Other Ambulatory Visit: Payer: Self-pay

## 2020-08-30 ENCOUNTER — Ambulatory Visit (INDEPENDENT_AMBULATORY_CARE_PROVIDER_SITE_OTHER): Payer: Medicare HMO | Admitting: Family Medicine

## 2020-08-30 ENCOUNTER — Encounter: Payer: Self-pay | Admitting: Family Medicine

## 2020-08-30 VITALS — BP 95/58 | HR 77 | Temp 97.3°F | Resp 20 | Ht 62.5 in | Wt 152.0 lb

## 2020-08-30 DIAGNOSIS — M810 Age-related osteoporosis without current pathological fracture: Secondary | ICD-10-CM

## 2020-08-30 DIAGNOSIS — I10 Essential (primary) hypertension: Secondary | ICD-10-CM

## 2020-08-30 DIAGNOSIS — N184 Chronic kidney disease, stage 4 (severe): Secondary | ICD-10-CM | POA: Diagnosis not present

## 2020-08-30 DIAGNOSIS — J410 Simple chronic bronchitis: Secondary | ICD-10-CM | POA: Diagnosis not present

## 2020-08-30 DIAGNOSIS — E7841 Elevated Lipoprotein(a): Secondary | ICD-10-CM

## 2020-08-30 MED ORDER — TIOTROPIUM BROMIDE MONOHYDRATE 18 MCG IN CAPS: 18.0000 ug | ORAL_CAPSULE | Freq: Every day | RESPIRATORY_TRACT | 3 refills | Status: DC

## 2020-08-30 MED ORDER — ATORVASTATIN CALCIUM 10 MG PO TABS: ORAL_TABLET | ORAL | 3 refills | Status: DC

## 2020-08-30 MED ORDER — BUDESONIDE-FORMOTEROL FUMARATE 160-4.5 MCG/ACT IN AERO: 2.0000 | INHALATION_SPRAY | Freq: Two times a day (BID) | RESPIRATORY_TRACT | 2 refills | Status: DC

## 2020-08-30 MED ORDER — LOSARTAN POTASSIUM 25 MG PO TABS: ORAL_TABLET | ORAL | 3 refills | Status: DC

## 2020-08-30 NOTE — Progress Notes (Signed)
Subjective: CC: Establish care, CKD 4, hypertension, hyperlipidemia PCP: [name], DO QMG:NOIBB Angela Parrish is a 80 y.o. female presenting to clinic today for:  1.  Hypertension with CKD 4, hyperlipidemia Patient reports compliance with Cozaar 25 mg daily, Lipitor 10 mg daily.  She is followed closely by Dr. Moshe Cipro with next appointment in about 4 to 6 months.  She has never had any complications of her renal disease including fluid overload, MI or CVA.  She is not on any fluid restriction.  She avoids NSAIDs totally.  Blood pressures tend to run in the 110s over 60s.  No dizziness or loss of consciousness.  No orthostasis.  2. COPD Patient is compliant with Spiriva, Symbicort.  No reports of hemoptysis.  She is a former smoker, who quit in the early 2000's-after being a smoker since age 45.  No reports of wheezing but she does get dyspneic on moderate exertion.   ROS: Per HPI  No Known Allergies Past Medical History:  Diagnosis Date  . Abnormal Pap smear    Previous colposcopy  . Arthritis    hip   . Asthma    childhood    . Breast cancer ([place]) 2000   Status post left lumpectomy  . COPD (chronic obstructive pulmonary disease) ([place])   . Dyspnea    on exertion ; improved with use Spiriva   . Headache    occ  . Hyperlipidemia   . Personal history of radiation therapy 2000    Current Outpatient Medications:  .  albuterol (PROVENTIL) (2.5 MG/3ML) 0.083% nebulizer solution, Take 3 mLs (2.5 mg total) by nebulization every 6 (six) hours as needed for wheezing or shortness of breath., Disp: 360 mL, Rfl: 12 .  albuterol (VENTOLIN HFA) 108 (90 Base) MCG/ACT inhaler, INHALE 2 PUFFS INTO THE LUNGS EVERY 6 HOURS AS NEEDED FOR WHEEZING OR SHORTNESS OF BREATH., Disp: 8 g, Rfl: 0 .  acetaminophen (TYLENOL) 325 MG tablet, Take 2 tablets (650 mg total) by mouth every 6 (six) hours as needed for mild pain (or Fever >/= 101). (Patient not taking: Reported on 08/30/2020), Disp:  , Rfl:  .  atorvastatin (LIPITOR) 10 MG tablet, TAKE ONE (1) TABLET EACH DAY, Disp: 90 tablet, Rfl: 3 .  budesonide-formoterol (SYMBICORT) 160-4.5 MCG/ACT inhaler, Inhale 2 puffs into the lungs 2 (two) times daily., Disp: 30.6 g, Rfl: 2 .  losartan (COZAAR) 25 MG tablet, TAKE ONE (1) TABLET EACH DAY, Disp: 90 tablet, Rfl: 3 .  tiotropium (SPIRIVA) 18 MCG inhalation capsule, Place 1 capsule (18 mcg total) into inhaler and inhale daily., Disp: 90 capsule, Rfl: 3 Social History   Socioeconomic History  . Marital status: Significant Other    Spouse name: Carlton Adam  . Number of children: 1  . Years of education: 20  . Highest education level: Some college, no degree  Occupational History  . Occupation: Retired    Comment: [employer]  Tobacco Use  . Smoking status: Former Smoker    Types: Cigarettes    Quit date: 08/14/2007    Years since quitting: 13.0  . Smokeless tobacco: Never Used  Vaping Use  . Vaping Use: Never used  Substance and Sexual Activity  . Alcohol use: No  . Drug use: No  . Sexual activity: Yes    Birth control/protection: Post-menopausal  Other Topics Concern  . Not on file  Social History Narrative   Retired from [employer] age 18   She has 1 daughter who is 65 years  old and lives in [place])   She has 2 grandsons age 63 and 9.   She has a significant other, Landscape architect.     No pets   She loves crafting and thrift stores   Social Determinants of Health   Financial Resource Strain: Not on file  Food Insecurity: Not on file  Transportation Needs: Not on file  Physical Activity: Not on file  Stress: Not on file  Social Connections: Not on file  Intimate Partner Violence: Not on file   Family History  Problem Relation Age of Onset  . Heart disease Father   . Lung cancer Father        smoker  . Heart attack Father   . Colon cancer Mother 54  . Heart attack Mother   . Heart disease Sister   . Hypertension Brother   . Heart attack Brother    . Leukemia Brother   . Obesity Daughter   . Anxiety disorder Daughter   . Hypertension Brother     Objective: Office vital signs reviewed. BP (!) 95/58   Pulse 77   Temp (!) 97.3 F (36.3 C)   Resp 20   Ht 5' 2.5" (1.588 m)   Wt 152 lb (68.9 kg)   SpO2 98%   BMI 27.36 kg/m   Physical Examination:  General: Awake, alert, well nourished, No acute distress HEENT: Normal; no carotid bruits, sclera white Cardio: regular rate and rhythm, S1S2 heard, no murmurs appreciated Pulm: clear to auscultation bilaterally, no wheezes, rhonchi or rales; normal work of breathing on room air Extremities: warm, well perfused, No edema, cyanosis or clubbing; +2 pulses bilaterally MSK: normal gait and station  Assessment/ Plan: 80 y.o. female   Chronic kidney disease, stage 4 (severe) (HCC) - Plan: Renal Function Panel, VITAMIN D 25 Hydroxy (Vit-D Deficiency, Fractures), CBC, CANCELED: VITAMIN D 25 Hydroxy (Vit-D Deficiency, Fractures), CANCELED: Renal Function Panel, CANCELED: CBC  Essential hypertension - Plan: losartan (COZAAR) 25 MG tablet  Elevated lipoprotein(a) - Plan: Lipid Panel, atorvastatin (LIPITOR) 10 MG tablet  Age-related osteoporosis without current pathological fracture - Plan: VITAMIN D 25 Hydroxy (Vit-D Deficiency, Fractures), CANCELED: VITAMIN D 25 Hydroxy (Vit-D Deficiency, Fractures)  Simple chronic bronchitis (HCC) - Plan: budesonide-formoterol (SYMBICORT) 160-4.5 MCG/ACT inhaler, tiotropium (SPIRIVA) 18 MCG inhalation capsule  Check renal function, will CC labs to her nephrologist.  Blood pressure was a little borderline low but she was asymptomatic.  I encouraged her to monitor her blood pressures for the next couple of weeks.  If systolics remain below 093 then I would like her to back down to 12.5 mg of the losartan.  Check lipid panel  I have renewed her COPD medications.  May consider triple therapy with Trelegy at some point if this would make co-pays more  convenient  She is interested in Prolia but the cost of it was too high the last time it was checked.  She is restricted due to CKD 4.  Will CC pharmacy to see if there is any suggestions for this patient.  Follow-up in 6 to 12 months for annual physical exam  Orders Placed This Encounter  Procedures  . Lipid Panel  . Renal Function Panel    Order Specific Question:   CC Results    Answer:   Corliss Parish [[id number]]  . VITAMIN D 25 Hydroxy (Vit-D Deficiency, Fractures)    Order Specific Question:   CC Results    Answer:   Corliss Parish [[id number]]  . CBC  Order Specific Question:   CC Results    Answer:   Corliss Parish [[id number]]   Meds ordered this encounter  Medications  . budesonide-formoterol (SYMBICORT) 160-4.5 MCG/ACT inhaler    Sig: Inhale 2 puffs into the lungs 2 (two) times daily.    Dispense:  30.6 g    Refill:  2  . atorvastatin (LIPITOR) 10 MG tablet    Sig: TAKE ONE (1) TABLET EACH DAY    Dispense:  90 tablet    Refill:  3  . tiotropium (SPIRIVA) 18 MCG inhalation capsule    Sig: Place 1 capsule (18 mcg total) into inhaler and inhale daily.    Dispense:  90 capsule    Refill:  3  . losartan (COZAAR) 25 MG tablet    Sig: TAKE ONE (1) TABLET EACH DAY    Dispense:  90 tablet    Refill:  [place], [place] [phone]

## 2020-08-30 NOTE — Patient Instructions (Signed)
It was a pleasure meeting you today!  I will copy all today's labs to Dr Moshe Cipro.  See me back in 6-12 months for a full physical exam.

## 2020-08-31 ENCOUNTER — Other Ambulatory Visit: Payer: Self-pay | Admitting: Family Medicine

## 2020-08-31 DIAGNOSIS — E559 Vitamin D deficiency, unspecified: Secondary | ICD-10-CM

## 2020-08-31 LAB — RENAL FUNCTION PANEL
Albumin: 4.3 g/dL (ref 3.7–4.7)
BUN/Creatinine Ratio: 15 (ref 12–28)
BUN: 37 mg/dL — ABNORMAL HIGH (ref 8–27)
CO2: 20 mmol/L (ref 20–29)
Calcium: 10.2 mg/dL (ref 8.7–10.3)
Chloride: 103 mmol/L (ref 96–106)
Creatinine, Ser: 2.44 mg/dL — ABNORMAL HIGH (ref 0.57–1.00)
GFR calc Af Amer: 21 mL/min/{1.73_m2} — ABNORMAL LOW (ref >59)
GFR calc non Af Amer: 18 mL/min/{1.73_m2} — ABNORMAL LOW (ref >59)
Glucose: 97 mg/dL (ref 65–99)
Phosphorus: 3.7 mg/dL (ref 3.0–4.3)
Potassium: 5.4 mmol/L — ABNORMAL HIGH (ref 3.5–5.2)
Sodium: 139 mmol/L (ref 134–144)

## 2020-08-31 LAB — CBC
Hematocrit: 40.3 % (ref 34.0–46.6)
Hemoglobin: 13.4 g/dL (ref 11.1–15.9)
MCH: 31.2 pg (ref 26.6–33.0)
MCHC: 33.3 g/dL (ref 31.5–35.7)
MCV: 94 fL (ref 79–97)
Platelets: 243 10*3/uL (ref 150–450)
RBC: 4.29 x10E6/uL (ref 3.77–5.28)
RDW: 12.1 % (ref 11.7–15.4)
WBC: 6.3 10*3/uL (ref 3.4–10.8)

## 2020-08-31 LAB — LIPID PANEL
Chol/HDL Ratio: 2.9 ratio (ref 0.0–4.4)
Cholesterol, Total: 207 mg/dL — ABNORMAL HIGH (ref 100–199)
HDL: 72 mg/dL (ref >39)
LDL Chol Calc (NIH): 115 mg/dL — ABNORMAL HIGH (ref 0–99)
Triglycerides: 112 mg/dL (ref 0–149)
VLDL Cholesterol Cal: 20 mg/dL (ref 5–40)

## 2020-08-31 LAB — VITAMIN D 25 HYDROXY (VIT D DEFICIENCY, FRACTURES): Vit D, 25-Hydroxy: 18 ng/mL — ABNORMAL LOW (ref 30.0–100.0)

## 2020-08-31 MED ORDER — VITAMIN D (ERGOCALCIFEROL) 1.25 MG (50000 UNIT) PO CAPS: 50000.0000 [IU] | ORAL_CAPSULE | ORAL | 0 refills | Status: AC

## 2020-09-15 ENCOUNTER — Telehealth: Payer: Self-pay

## 2020-09-15 NOTE — Telephone Encounter (Signed)
Left message to call back  

## 2020-09-16 ENCOUNTER — Other Ambulatory Visit: Payer: Self-pay | Admitting: Family Medicine

## 2020-09-16 DIAGNOSIS — J410 Simple chronic bronchitis: Secondary | ICD-10-CM

## 2020-09-16 NOTE — Telephone Encounter (Signed)
Returning nurse call.

## 2020-09-21 ENCOUNTER — Telehealth: Payer: Self-pay

## 2020-09-21 NOTE — Telephone Encounter (Signed)
Spoke to patient she states that she contacted her insurance in regards to Prolia and was told her cost is @ 9.85 patient would like to start the medication. Asked the patient which pharmacy she prefers for the medication.   Patient states that she is confused on the process for this medication.  Asked to speak to Dr. Lajuana Ripple, I informed her that she is with patients.   Please review this as I am not sure if PA was started for medication or if one is needed.   And call patient as she needs more information

## 2020-09-27 ENCOUNTER — Telehealth: Payer: Self-pay | Admitting: *Deleted

## 2020-09-27 NOTE — Telephone Encounter (Signed)
Patient was confused about the process with prolia. I advised patient that we are working on getting her prolia approved. She thought that she was supposed to pick up and administer herself. She is aware that we will contact her once we get the prolia approved and will call her with the cost.

## 2020-10-25 ENCOUNTER — Telehealth: Payer: Self-pay

## 2020-10-25 ENCOUNTER — Other Ambulatory Visit: Payer: Self-pay | Admitting: Family Medicine

## 2020-10-25 DIAGNOSIS — E559 Vitamin D deficiency, unspecified: Secondary | ICD-10-CM

## 2020-10-25 NOTE — Telephone Encounter (Signed)
done

## 2020-10-26 ENCOUNTER — Telehealth: Payer: Self-pay | Admitting: *Deleted

## 2020-10-26 NOTE — Telephone Encounter (Signed)
Pt says that she spoke with [facility] and she was told that it would be $9.85. She is willing to go ahead with the $9.85 price. Pt says that Caryl Pina told her to call [employer] and she did just that and was given that price. I relayed the message to patient about with the $280 price and she does not want to pay that.

## 2020-10-26 NOTE — Telephone Encounter (Signed)
Left message for patient to call back. We are working on [employer] and it looks like her portion would be approximately $280. Would patient still be interested in going ahead with Prolia?

## 2020-10-26 NOTE — Telephone Encounter (Signed)
Will review once more with [employer]

## 2020-10-27 ENCOUNTER — Other Ambulatory Visit: Payer: Medicare HMO

## 2020-10-27 ENCOUNTER — Other Ambulatory Visit: Payer: Self-pay

## 2020-10-27 DIAGNOSIS — E559 Vitamin D deficiency, unspecified: Secondary | ICD-10-CM

## 2020-10-28 LAB — VITAMIN D 25 HYDROXY (VIT D DEFICIENCY, FRACTURES): Vit D, 25-Hydroxy: 25.1 ng/mL — ABNORMAL LOW (ref 30.0–100.0)

## 2020-10-29 ENCOUNTER — Other Ambulatory Visit: Payer: Self-pay | Admitting: Family Medicine

## 2020-10-29 DIAGNOSIS — M810 Age-related osteoporosis without current pathological fracture: Secondary | ICD-10-CM

## 2020-10-29 DIAGNOSIS — E559 Vitamin D deficiency, unspecified: Secondary | ICD-10-CM

## 2020-10-29 DIAGNOSIS — N184 Chronic kidney disease, stage 4 (severe): Secondary | ICD-10-CM

## 2020-10-29 MED ORDER — VITAMIN D (ERGOCALCIFEROL) 1.25 MG (50000 UNIT) PO CAPS
50000.0000 [IU] | ORAL_CAPSULE | ORAL | 3 refills | Status: DC
Start: 2020-10-29 — End: 2021-10-13

## 2020-10-29 MED ORDER — DENOSUMAB 60 MG/ML ~~LOC~~ SOSY: 60.0000 mg | PREFILLED_SYRINGE | Freq: Once | SUBCUTANEOUS | 0 refills | Status: DC

## 2020-10-29 MED ORDER — DENOSUMAB 60 MG/ML ~~LOC~~ SOSY: 60.0000 mg | PREFILLED_SYRINGE | Freq: Once | SUBCUTANEOUS | 0 refills | Status: AC

## 2020-10-29 NOTE — Telephone Encounter (Signed)
Patient aware that through medical benefits it will cost approximately $280. Patient would like for [place] to run it through her prescription benefits to see what the cost might be. [facility] [place]. We will complete a pharmacy PA for patient. Patient aware we will contact her once we have it all completed.    Per Aaron Edelman at [employer] it will cost patient $9.85 but he is unable to order. Rx sent to [facility] for patient.   Patient will call [employer] to make sure they are able to ship to her. She will call and let us know when she receives so we can schedule her an appointment to administer.

## 2020-11-04 NOTE — Telephone Encounter (Signed)
FYI only.

## 2020-11-09 NOTE — Telephone Encounter (Signed)
Patient's Prolia came in today. Placed in lab fridge. Will notify clinical leads.

## 2020-11-10 ENCOUNTER — Ambulatory Visit: Payer: Medicare HMO | Admitting: Physician Assistant

## 2020-11-11 ENCOUNTER — Other Ambulatory Visit: Payer: Self-pay

## 2020-11-11 ENCOUNTER — Ambulatory Visit (INDEPENDENT_AMBULATORY_CARE_PROVIDER_SITE_OTHER): Payer: Medicare HMO

## 2020-11-11 DIAGNOSIS — M810 Age-related osteoporosis without current pathological fracture: Secondary | ICD-10-CM | POA: Diagnosis not present

## 2020-11-11 MED ORDER — DENOSUMAB 60 MG/ML ~~LOC~~ SOSY
60.0000 mg | PREFILLED_SYRINGE | SUBCUTANEOUS | Status: DC
  Administered 2020-11-11 – 2023-01-12 (×4): 60 mg via SUBCUTANEOUS

## 2020-11-11 NOTE — Telephone Encounter (Signed)
Patient received 03/24. Next one due in September

## 2020-11-11 NOTE — Progress Notes (Signed)
Patient given prolia injection and tolerated well.

## 2020-11-11 NOTE — Telephone Encounter (Signed)
Appointment scheduled for 11/11/20

## 2020-11-22 ENCOUNTER — Other Ambulatory Visit: Payer: Self-pay | Admitting: Family Medicine

## 2020-11-22 DIAGNOSIS — J410 Simple chronic bronchitis: Secondary | ICD-10-CM

## 2020-12-08 ENCOUNTER — Other Ambulatory Visit: Payer: Self-pay | Admitting: Family Medicine

## 2020-12-08 DIAGNOSIS — J410 Simple chronic bronchitis: Secondary | ICD-10-CM

## 2020-12-08 DIAGNOSIS — E7841 Elevated Lipoprotein(a): Secondary | ICD-10-CM

## 2020-12-20 DIAGNOSIS — N184 Chronic kidney disease, stage 4 (severe): Secondary | ICD-10-CM | POA: Diagnosis not present

## 2020-12-20 DIAGNOSIS — I129 Hypertensive chronic kidney disease with stage 1 through stage 4 chronic kidney disease, or unspecified chronic kidney disease: Secondary | ICD-10-CM | POA: Diagnosis not present

## 2021-01-12 ENCOUNTER — Other Ambulatory Visit: Payer: Self-pay | Admitting: Family Medicine

## 2021-01-12 DIAGNOSIS — I10 Essential (primary) hypertension: Secondary | ICD-10-CM

## 2021-01-12 DIAGNOSIS — J410 Simple chronic bronchitis: Secondary | ICD-10-CM

## 2021-01-27 ENCOUNTER — Other Ambulatory Visit: Payer: Self-pay

## 2021-01-27 ENCOUNTER — Ambulatory Visit (INDEPENDENT_AMBULATORY_CARE_PROVIDER_SITE_OTHER): Payer: Medicare HMO | Admitting: Physician Assistant

## 2021-01-27 ENCOUNTER — Encounter: Payer: Self-pay | Admitting: Physician Assistant

## 2021-01-27 DIAGNOSIS — C44519 Basal cell carcinoma of skin of other part of trunk: Secondary | ICD-10-CM

## 2021-01-27 DIAGNOSIS — D485 Neoplasm of uncertain behavior of skin: Secondary | ICD-10-CM

## 2021-01-27 DIAGNOSIS — Z85828 Personal history of other malignant neoplasm of skin: Secondary | ICD-10-CM | POA: Diagnosis not present

## 2021-01-27 DIAGNOSIS — Z1283 Encounter for screening for malignant neoplasm of skin: Secondary | ICD-10-CM | POA: Diagnosis not present

## 2021-01-27 NOTE — Progress Notes (Signed)
   New Patient   Subjective  Angela Parrish is a 80 y.o. female who presents for the following: New Patient (Initial Visit) (Patient here today for lesion on right clavicle x unsure. Per patient her daughter told her she needed to have the lesion checked. No bleeding, no pain. Personal history of non mole skin cancer. No personal history of atypical moles or melanoma. No family history of atypical moles, melanoma or non mole skin cancer. ).   The following portions of the chart were reviewed this encounter and updated as appropriate: Allergies  Meds  Problems  Med Hx  Surg Hx  Fam Hx        Objective  Well appearing patient in no apparent distress; mood and affect are within normal limits.  A full examination was performed including scalp, head, eyes, ears, nose, lips, neck, chest, axillae, abdomen, back, buttocks, bilateral upper extremities, bilateral lower extremities, hands, feet, fingers, toes, fingernails, and toenails. All findings within normal limits unless otherwise noted below.  Chest - Medial [facility])      Assessment & Plan  Neoplasm of uncertain behavior of skin Chest - Medial (Center)  Skin / nail biopsy Type of biopsy: tangential   Informed consent: discussed and consent obtained   Timeout: patient name, date of birth, surgical site, and procedure verified   Procedure prep:  Patient was prepped and draped in usual sterile fashion (Non sterile) Prep type:  Chlorhexidine Anesthesia: the lesion was anesthetized in a standard fashion   Anesthetic:  1% lidocaine w/ epinephrine 1-100,000 local infiltration Instrument used: flexible razor blade   Outcome: patient tolerated procedure well   Post-procedure details: wound care instructions given    Specimen 1 - Surgical pathology Differential Diagnosis:bcc vs scc  Check Margins:yes   I, Aryana Wonnacott, PA-C, have reviewed all documentation for this visit. The documentation on 01/27/21 for the exam,  diagnosis, procedures, and orders are all accurate and complete.

## 2021-02-07 ENCOUNTER — Other Ambulatory Visit: Payer: Self-pay | Admitting: Family Medicine

## 2021-02-07 DIAGNOSIS — J410 Simple chronic bronchitis: Secondary | ICD-10-CM

## 2021-02-09 ENCOUNTER — Telehealth: Payer: Self-pay | Admitting: *Deleted

## 2021-02-09 NOTE — Telephone Encounter (Signed)
-----   Message from Warren Danes, [place] sent at 02/08/2021  2:54 PM EDT ----- mohs ----- Message ----- From: Interface, Lab In [facility] [place] Sent: 02/04/2021   6:12 PM EDT To: Warren Danes, PA-C

## 2021-02-09 NOTE — Telephone Encounter (Signed)
Path to patient mohs referral sent via proficient.

## 2021-02-09 NOTE — Telephone Encounter (Signed)
Left message for patient to call back for pathology report.  

## 2021-02-14 ENCOUNTER — Other Ambulatory Visit: Payer: Self-pay | Admitting: Family Medicine

## 2021-02-14 DIAGNOSIS — J410 Simple chronic bronchitis: Secondary | ICD-10-CM

## 2021-02-14 DIAGNOSIS — E7841 Elevated Lipoprotein(a): Secondary | ICD-10-CM

## 2021-02-18 ENCOUNTER — Encounter: Payer: Self-pay | Admitting: Family Medicine

## 2021-02-18 NOTE — Progress Notes (Signed)
Angela Parrish is a 80 y.o. female presents to office today for annual physical exam examination.    Concerns today include: 1. CKD4 Managed by Dr. Moshe Cipro.  Last OV was in May.  Next office visit in 6 months.  She reports good urine output and notes that her labs when checked in May seem to be better than previous.  She is not sure if her vitamin D was rechecked but she is compliant with her vitamin D capsules every Saturday.  She has been off of the losartan and blood pressures have been well controlled without it.  She is hydrating adequately.  She avoids oral NSAIDs.  2.  Osteoporosis Patient is doing well with Prolia thus far.  Has no complaints or concerns.  3.  Chronic bronchitis Patient currently is using Symbicort and Spiriva.  She notes that the Spiriva actually had a few empty capsules and she had to use a new capsule to get her daily dose.  This is happened to her on the several occasions but she has not reached out to the manufacture because she does not know how to do this.  She was interested in switching over to Trelegy.  Her brother is having good results with this.  She denies any hemoptysis.  Her breathing has been stable.  She is able to tolerate physical activity without difficulty.  She uses her albuterol nebs 2-3 times per week and her albuterol puffer once every 2 to 3 weeks.  She finds a greater need for this when it is hot outside.  Occupation: Retired  Diet: Fair (eats late with her friend), Exercise: Tries to stay active Last eye exam: Needs Last colonoscopy: Up-to-date Last mammogram: Up-to-date Last pap smear: N/A Refills needed today: lipitor Immunizations needed:  Immunization History  Administered Date(s) Administered   Fluad Quad(high Dose 65+) 05/05/2019, 05/26/2020   Influenza, High Dose Seasonal PF 07/11/2016, 06/13/2017, 05/17/2018   Moderna Sars-Covid-2 Vaccination 10/21/2019, 11/18/2019, 07/06/2020   Pneumococcal Conjugate-13 06/20/2017    Pneumococcal Polysaccharide-23 06/25/2014   Tdap 11/22/2016   Zoster, Live 09/19/2008     Past Medical History:  Diagnosis Date   Abnormal Pap smear    Previous colposcopy   Acute pulmonary embolism ([place]) 09/21/2018   Arthritis    hip    Asthma    childhood     Basal cell carcinoma 07/08/2001   Right Forehead (curet, excision)   Breast cancer ([place]) 2000   Status post left lumpectomy   Closed fracture of third toe of right foot 03/13/2017   COPD (chronic obstructive pulmonary disease) ([place])    Dyspnea    on exertion ; improved with use Spiriva    Headache    occ   Hyperlipidemia    Personal history of radiation therapy 2000   Right leg DVT ([place]) 09/22/2018   Social History   Socioeconomic History   Marital status: Significant Other    Spouse name: Landscape architect   Number of children: 1   Years of education: 12   Highest education level: Some college, no degree  Occupational History   Occupation: Retired    Comment: [employer]  Tobacco Use   Smoking status: Former    Pack years: 0.00    Types: Cigarettes    Quit date: 08/14/2007    Years since quitting: 13.5   Smokeless tobacco: Never  Vaping Use   Vaping Use: Never used  Substance and Sexual Activity   Alcohol use: No   Drug use: No  Sexual activity: Yes    Birth control/protection: Post-menopausal  Other Topics Concern   Not on file  Social History Narrative   Retired from [employer] age 69   She has 1 daughter who is 84 years old and lives in [place])   She has 2 grandsons age 10 and 68.   She has a significant other, Landscape architect.     No pets   She loves crafting and thrift stores   Social Determinants of Health   Financial Resource Strain: Not on file  Food Insecurity: Not on file  Transportation Needs: Not on file  Physical Activity: Not on file  Stress: Not on file  Social Connections: Not on file  Intimate Partner Violence: Not on file   Past Surgical History:  Procedure Laterality  Date   BREAST LUMPECTOMY Left 2000   CATARACT EXTRACTION, BILATERAL     COLONOSCOPY N/A 09/16/2014   Procedure: COLONOSCOPY;  Surgeon: Rogene Houston, MD;  Location: [facility];  Service: Endoscopy;  Laterality: N/A;  830 - moved to 9:15 - Ann to notify   JOINT REPLACEMENT  06/2018   right hip   KNEE CARTILAGE SURGERY Right    TOTAL HIP ARTHROPLASTY Right 07/17/2018   Procedure: RIGHT TOTAL HIP ARTHROPLASTY ANTERIOR APPROACH;  Surgeon: Gaynelle Arabian, MD;  Location: [facility] OR[facility];  Service: Orthopedics;  Laterality: Right;  188min   Family History  Problem Relation Age of Onset   Heart disease Father    Lung cancer Father        smoker   Heart attack Father    Colon cancer Mother 87   Heart attack Mother    Heart disease Sister    Hypertension Brother    Heart attack Brother    Leukemia Brother    Obesity Daughter    Anxiety disorder Daughter    Hypertension Brother     Current Outpatient Medications:    albuterol (VENTOLIN HFA) 108 (90 Base) MCG/ACT inhaler, INHALE 2 PUFFS INTO THE LUNGS EVERY 6 HOURS AS NEEDED FOR WHEEZING OR SHORTNESS OF BREATH., Disp: 18 g, Rfl: 1   acetaminophen (TYLENOL) 325 MG tablet, Take 2 tablets (650 mg total) by mouth every 6 (six) hours as needed for mild pain (or Fever >/= 101)., Disp: , Rfl:    albuterol (PROVENTIL) (2.5 MG/3ML) 0.083% nebulizer solution, Take 3 mLs (2.5 mg total) by nebulization every 6 (six) hours as needed for wheezing or shortness of breath., Disp: 360 mL, Rfl: 12   atorvastatin (LIPITOR) 10 MG tablet, TAKE 1 TABLET EVERY DAY, Disp: 90 tablet, Rfl: 0   PROLIA 60 MG/ML SOSY injection, , Disp: , Rfl:    SPIRIVA HANDIHALER 18 MCG inhalation capsule, PLACE 1 CAPSULE (18 MCG TOTAL) INTO INHALER AND INHALE DAILY., Disp: 90 capsule, Rfl: 0   SYMBICORT 160-4.5 MCG/ACT inhaler, INHALE 2 PUFFS INTO THE LUNGS 2 (TWO) TIMES DAILY., Disp: 30.6 g, Rfl: 0   Vitamin D, Ergocalciferol, (DRISDOL) 1.25 MG (50000 UNIT) CAPS capsule, Take 1 capsule  (50,000 Units total) by mouth every 7 (seven) days., Disp: 12 capsule, Rfl: 3  Current Facility-Administered Medications:    denosumab (PROLIA) injection 60 mg, 60 mg, Subcutaneous, Q6 months, Jhon Mallozzi M, DO, 60 mg at 11/11/20 1434  No Known Allergies   ROS: Review of Systems Pertinent items noted in HPI and remainder of comprehensive ROS otherwise negative.    Physical exam BP 116/66   Pulse 80   Temp 98.2 F (36.8 C)   Ht  5' 2.5" (1.588 m)   Wt 155 lb 3.2 oz (70.4 kg)   SpO2 97%   BMI 27.93 kg/m  General appearance: alert, cooperative, appears stated age, and no distress Head: Normocephalic, without obvious abnormality, atraumatic Eyes: negative findings: lids and lashes normal, conjunctivae and sclerae normal, corneas clear, and pupils equal, round, reactive to light and accomodation Ears: normal TM's and external ear canals both ears Nose: Nares normal. Septum midline. Mucosa normal. No drainage or sinus tenderness. Throat: lips, mucosa, and tongue normal; teeth and gums normal Neck: no adenopathy, no carotid bruit, supple, symmetrical, trachea midline, and thyroid not enlarged, symmetric, no tenderness/mass/nodules Back: symmetric, no curvature. ROM normal. No CVA tenderness. Lungs: clear to auscultation bilaterally Heart: regular rate and rhythm, S1, S2 normal, no murmur, click, rub or gallop Abdomen: soft, non-tender; bowel sounds normal; no masses,  no organomegaly Extremities: extremities normal, atraumatic, no cyanosis or edema Pulses: 2+ and symmetric Skin: Skin color, texture, turgor normal. No rashes or lesions Lymph nodes: Cervical, supraclavicular, and axillary nodes normal. Neurologic: Alert and oriented X 3, normal strength and tone. Normal symmetric reflexes. Normal coordination and gait    Assessment/ Plan: [name] here for annual physical exam.   Annual physical exam  Chronic kidney disease, stage 4 (severe) ([place]) - Plan: Renal  Function Panel, VITAMIN D 25 Hydroxy (Vit-D Deficiency, Fractures)  Age-related osteoporosis without current pathological fracture - Plan: VITAMIN D 25 Hydroxy (Vit-D Deficiency, Fractures)  Essential hypertension - Plan: Renal Function Panel  Elevated lipoprotein(a) - Plan: Lipid Panel, atorvastatin (LIPITOR) 10 MG tablet  History of breast cancer  Simple chronic bronchitis ([place]) - Plan: Fluticasone-Umeclidin-Vilant (TRELEGY ELLIPTA) 200-62.5-25 MCG/INH AEPB  Recheck renal function.  She has been off of her losartan.  Hopefully will see a positive improvement now that her blood pressure has normalized.  Will CC lab results to her primary nephrologist  Check vitamin D level.  She is compliant with her vitamin D replacement  Blood pressure well controlled without medications  Check lipid panel.  Lipitor renewed  Continue Prolia given known osteoporosis and history of breast cancer  Trial of Trelegy today.  Samples have been provided.  Discussed discontinuing Spiriva and Symbicort whilst using the sample.  She will contact us if she wishes to proceed with Trelegy alone.  For now I have left both meds on her MAR.  Counseled on healthy lifestyle choices, including diet (rich in fruits, vegetables and lean meats and low in salt and simple carbohydrates) and exercise (at least 30 minutes of moderate physical activity daily).  Patient to follow up in 1 year for annual exam or sooner if needed.  Suni Jarnagin M. Lajuana Ripple, DO

## 2021-02-25 ENCOUNTER — Ambulatory Visit: Payer: Medicare HMO

## 2021-02-28 ENCOUNTER — Ambulatory Visit (INDEPENDENT_AMBULATORY_CARE_PROVIDER_SITE_OTHER): Payer: Medicare HMO | Admitting: Family Medicine

## 2021-02-28 ENCOUNTER — Other Ambulatory Visit: Payer: Self-pay

## 2021-02-28 ENCOUNTER — Encounter: Payer: Self-pay | Admitting: Family Medicine

## 2021-02-28 VITALS — BP 116/66 | HR 80 | Temp 98.2°F | Ht 62.5 in | Wt 155.2 lb

## 2021-02-28 DIAGNOSIS — E7841 Elevated Lipoprotein(a): Secondary | ICD-10-CM | POA: Diagnosis not present

## 2021-02-28 DIAGNOSIS — I10 Essential (primary) hypertension: Secondary | ICD-10-CM

## 2021-02-28 DIAGNOSIS — Z0001 Encounter for general adult medical examination with abnormal findings: Secondary | ICD-10-CM

## 2021-02-28 DIAGNOSIS — M810 Age-related osteoporosis without current pathological fracture: Secondary | ICD-10-CM | POA: Diagnosis not present

## 2021-02-28 DIAGNOSIS — J410 Simple chronic bronchitis: Secondary | ICD-10-CM | POA: Diagnosis not present

## 2021-02-28 DIAGNOSIS — N184 Chronic kidney disease, stage 4 (severe): Secondary | ICD-10-CM | POA: Diagnosis not present

## 2021-02-28 DIAGNOSIS — Z0000 Encounter for general adult medical examination without abnormal findings: Secondary | ICD-10-CM

## 2021-02-28 DIAGNOSIS — Z853 Personal history of malignant neoplasm of breast: Secondary | ICD-10-CM | POA: Diagnosis not present

## 2021-02-28 MED ORDER — ATORVASTATIN CALCIUM 10 MG PO TABS: 10.0000 mg | ORAL_TABLET | Freq: Every day | ORAL | 3 refills | Status: DC

## 2021-02-28 MED ORDER — TRELEGY ELLIPTA 200-62.5-25 MCG/INH IN AEPB: 1.0000 | INHALATION_SPRAY | Freq: Every day | RESPIRATORY_TRACT | 0 refills | Status: DC

## 2021-02-28 NOTE — Patient Instructions (Signed)
STOP Spiriva AND Symbicort while you are sampling the Trelegy.  If you like the trelegy better, see Almyra Free our clinical pharmacist here for further assistance.  You had labs performed today.  You will be contacted with the results of the labs once they are available, usually in the next 3 business days for routine lab work.  If you have an active my chart account, they will be released to your MyChart.  If you prefer to have these labs released to you via telephone, please let us know.  If you had a pap smear or biopsy performed, expect to be contacted in about 7-10 days.  Preventive Care 81 Years and Older, Female Preventive care refers to lifestyle choices and visits with your health care provider that can promote health and wellness. This includes: A yearly physical exam. This is also called an annual wellness visit. Regular dental and eye exams. Immunizations. Screening for certain conditions. Healthy lifestyle choices, such as: Eating a healthy diet. Getting regular exercise. Not using drugs or products that contain nicotine and tobacco. Limiting alcohol use. What can I expect for my preventive care visit? Physical exam Your health care provider will check your: Height and weight. These may be used to calculate your BMI (body mass index). BMI is a measurement that tells if you are at a healthy weight. Heart rate and blood pressure. Body temperature. Skin for abnormal spots. Counseling Your health care provider may ask you questions about your: Past medical problems. Family's medical history. Alcohol, tobacco, and drug use. Emotional well-being. Home life and relationship well-being. Sexual activity. Diet, exercise, and sleep habits. History of falls. Memory and ability to understand (cognition). Work and work Statistician. Pregnancy and menstrual history. Access to firearms. What immunizations do I need?  Vaccines are usually given at various ages, according to a  schedule. Your health care provider will recommend vaccines for you based on your age, medicalhistory, and lifestyle or other factors, such as travel or where you work. What tests do I need? Blood tests Lipid and cholesterol levels. These may be checked every 5 years, or more often depending on your overall health. Hepatitis C test. Hepatitis B test. Screening Lung cancer screening. You may have this screening every year starting at age 81 if you have a 30-pack-year history of smoking and currently smoke or have quit within the past 15 years. Colorectal cancer screening. All adults should have this screening starting at age 81 and continuing until age 60. Your health care provider may recommend screening at age 75 if you are at increased risk. You will have tests every 1-10 years, depending on your results and the type of screening test. Diabetes screening. This is done by checking your blood sugar (glucose) after you have not eaten for a while (fasting). You may have this done every 1-3 years. Mammogram. This may be done every 1-2 years. Talk with your health care provider about how often you should have regular mammograms. Abdominal aortic aneurysm (AAA) screening. You may need this if you are a current or former smoker. BRCA-related cancer screening. This may be done if you have a family history of breast, ovarian, tubal, or peritoneal cancers. Other tests STD (sexually transmitted disease) testing, if you are at risk. Bone density scan. This is done to screen for osteoporosis. You may have this done starting at age 62. Talk with your health care provider about your test results, treatment options,and if necessary, the need for more tests. Follow these instructions at home:  Eating and drinking  Eat a diet that includes fresh fruits and vegetables, whole grains, lean protein, and low-fat dairy products. Limit your intake of foods with high amounts of sugar, saturated fats, and  salt. Take vitamin and mineral supplements as recommended by your health care provider. Do not drink alcohol if your health care provider tells you not to drink. If you drink alcohol: Limit how much you have to 0-1 drink a day. Be aware of how much alcohol is in your drink. In the [place] one drink equals one 12 oz bottle of beer (355 mL), one 5 oz glass of wine (148 mL), or one 1 oz glass of hard liquor (44 mL).  Lifestyle Take daily care of your teeth and gums. Brush your teeth every morning and night with fluoride toothpaste. Floss one time each day. Stay active. Exercise for at least 30 minutes 5 or more days each week. Do not use any products that contain nicotine or tobacco, such as cigarettes, e-cigarettes, and chewing tobacco. If you need help quitting, ask your health care provider. Do not use drugs. If you are sexually active, practice safe sex. Use a condom or other form of protection in order to prevent STIs (sexually transmitted infections). Talk with your health care provider about taking a low-dose aspirin or statin. Find healthy ways to cope with stress, such as: Meditation, yoga, or listening to music. Journaling. Talking to a trusted person. Spending time with friends and family. Safety Always wear your seat belt while driving or riding in a vehicle. Do not drive: If you have been drinking alcohol. Do not ride with someone who has been drinking. When you are tired or distracted. While texting. Wear a helmet and other protective equipment during sports activities. If you have firearms in your house, make sure you follow all gun safety procedures. What's next? Visit your health care provider once a year for an annual wellness visit. Ask your health care provider how often you should have your eyes and teeth checked. Stay up to date on all vaccines. This information is not intended to replace advice given to you by your health care provider. Make sure you discuss any  questions you have with your healthcare provider. Document Revised: 07/28/2020 Document Reviewed: 08/01/2018 Elsevier Patient Education  2022 [employer].

## 2021-03-01 LAB — RENAL FUNCTION PANEL
Albumin: 4.5 g/dL (ref 3.7–4.7)
BUN/Creatinine Ratio: 16 (ref 12–28)
BUN: 36 mg/dL — ABNORMAL HIGH (ref 8–27)
CO2: 22 mmol/L (ref 20–29)
Calcium: 9.9 mg/dL (ref 8.7–10.3)
Chloride: 103 mmol/L (ref 96–106)
Creatinine, Ser: 2.21 mg/dL — ABNORMAL HIGH (ref 0.57–1.00)
Glucose: 94 mg/dL (ref 65–99)
Phosphorus: 3.3 mg/dL (ref 3.0–4.3)
Potassium: 5.6 mmol/L — ABNORMAL HIGH (ref 3.5–5.2)
Sodium: 141 mmol/L (ref 134–144)
eGFR: 22 mL/min/{1.73_m2} — ABNORMAL LOW (ref >59)

## 2021-03-01 LAB — LIPID PANEL
Chol/HDL Ratio: 2.3 ratio (ref 0.0–4.4)
Cholesterol, Total: 196 mg/dL (ref 100–199)
HDL: 84 mg/dL (ref >39)
LDL Chol Calc (NIH): 98 mg/dL (ref 0–99)
Triglycerides: 79 mg/dL (ref 0–149)
VLDL Cholesterol Cal: 14 mg/dL (ref 5–40)

## 2021-03-01 LAB — VITAMIN D 25 HYDROXY (VIT D DEFICIENCY, FRACTURES): Vit D, 25-Hydroxy: 39.9 ng/mL (ref 30.0–100.0)

## 2021-03-03 ENCOUNTER — Ambulatory Visit (INDEPENDENT_AMBULATORY_CARE_PROVIDER_SITE_OTHER): Payer: Medicare HMO

## 2021-03-03 VITALS — Ht 63.0 in | Wt 152.0 lb

## 2021-03-03 DIAGNOSIS — Z0000 Encounter for general adult medical examination without abnormal findings: Secondary | ICD-10-CM | POA: Diagnosis not present

## 2021-03-03 NOTE — Progress Notes (Signed)
Subjective:   Angela Parrish is a 80 y.o. female who presents for Medicare Annual (Subsequent) preventive examination.  Virtual Visit via Telephone Note  I connected with  Angela Parrish on 03/03/21 at  8:15 AM EDT by telephone and verified that I am speaking with the correct person using two identifiers.  Location: Patient: Home Provider: [facility] Persons participating in the virtual visit: patient/Nurse Health Advisor   I discussed the limitations, risks, security and privacy concerns of performing an evaluation and management service by telephone and the availability of in person appointments. The patient expressed understanding and agreed to proceed.  Interactive audio and video telecommunications were attempted between this nurse and patient, however failed, due to patient having technical difficulties OR patient did not have access to video capability.  We continued and completed visit with audio only.  Some vital signs may be absent or patient reported.   Angela Rehm E Kiara Keep, LPN   Review of Systems     Cardiac Risk Factors include: advanced age (>58men, >74 women);sedentary lifestyle;Other (see comment);dyslipidemia, Risk factor comments: chronic bronchitis     Objective:    Today's Vitals   03/03/21 0817  Weight: 152 lb (68.9 kg)  Height: 5\' 3"  (1.6 m)   Body mass index is 26.93 kg/m.  Advanced Directives 05/19/2019 09/22/2018 09/21/2018 09/21/2018 07/17/2018 07/12/2018 05/17/2018  Does Patient Have a Medical Advance Directive? Yes No Yes Yes No No No  Type of Paramedic of [place];Living will Healthcare Power of Attorney Living will [place] - - -  Does patient want to make changes to medical advance directive? No - Patient declined No - Patient declined No - Patient declined No - Patient declined - - -  Copy of [place] in Chart? Yes - validated most recent copy scanned in chart (See row information) No -  copy requested No - copy requested No - copy requested - - -  Would patient like information on creating a medical advance directive? - No - Patient declined No - Patient declined No - Patient declined No - Patient declined - -    Current Medications (verified) Outpatient Encounter Medications as of 03/03/2021  Medication Sig   acetaminophen (TYLENOL) 325 MG tablet Take 2 tablets (650 mg total) by mouth every 6 (six) hours as needed for mild pain (or Fever >/= 101).   albuterol (PROVENTIL) (2.5 MG/3ML) 0.083% nebulizer solution Take 3 mLs (2.5 mg total) by nebulization every 6 (six) hours as needed for wheezing or shortness of breath.   albuterol (VENTOLIN HFA) 108 (90 Base) MCG/ACT inhaler INHALE 2 PUFFS INTO THE LUNGS EVERY 6 HOURS AS NEEDED FOR WHEEZING OR SHORTNESS OF BREATH.   atorvastatin (LIPITOR) 10 MG tablet Take 1 tablet (10 mg total) by mouth daily.   Fluticasone-Umeclidin-Vilant (TRELEGY ELLIPTA) 200-62.5-25 MCG/INH AEPB Inhale 1 puff into the lungs daily.   SPIRIVA HANDIHALER 18 MCG inhalation capsule PLACE 1 CAPSULE (18 MCG TOTAL) INTO INHALER AND INHALE DAILY.   SYMBICORT 160-4.5 MCG/ACT inhaler INHALE 2 PUFFS INTO THE LUNGS 2 (TWO) TIMES DAILY.   Vitamin D, Ergocalciferol, (DRISDOL) 1.25 MG (50000 UNIT) CAPS capsule Take 1 capsule (50,000 Units total) by mouth every 7 (seven) days.   [DISCONTINUED] PROLIA 60 MG/ML SOSY injection    Facility-Administered Encounter Medications as of 03/03/2021  Medication   denosumab (PROLIA) injection 60 mg    Allergies (verified) Patient has no known allergies.   History: Past Medical History:  Diagnosis Date  Abnormal Pap smear    Previous colposcopy   Acute pulmonary embolism ([place]) 09/21/2018   Arthritis    hip    Asthma    childhood     Basal cell carcinoma 07/08/2001   Right Forehead (curet, excision)   Breast cancer ([place]) 2000   Status post left lumpectomy   Closed fracture of third toe of right foot 03/13/2017   COPD (chronic  obstructive pulmonary disease) (HCC)    Dyspnea    on exertion ; improved with use Spiriva    Headache    occ   Hyperlipidemia    Personal history of radiation therapy 2000   Right leg DVT ([place]) 09/22/2018   Past Surgical History:  Procedure Laterality Date   BREAST LUMPECTOMY Left 2000   CATARACT EXTRACTION, BILATERAL     COLONOSCOPY N/A 09/16/2014   Procedure: COLONOSCOPY;  Surgeon: Rogene Houston, MD;  Location: [facility];  Service: Endoscopy;  Laterality: N/A;  830 - moved to 9:15 - Ann to notify   JOINT REPLACEMENT  06/2018   right hip   KNEE CARTILAGE SURGERY Right    TOTAL HIP ARTHROPLASTY Right 07/17/2018   Procedure: RIGHT TOTAL HIP ARTHROPLASTY ANTERIOR APPROACH;  Surgeon: Gaynelle Arabian, MD;  Location: [facility];  Service: Orthopedics;  Laterality: Right;  167min   Family History  Problem Relation Age of Onset   Heart disease Father    Lung cancer Father        smoker   Heart attack Father    Colon cancer Mother 71   Heart attack Mother    Heart disease Sister    Hypertension Brother    Heart attack Brother    Leukemia Brother    Obesity Daughter    Anxiety disorder Daughter    Hypertension Brother    Social History   Socioeconomic History   Marital status: Significant Other    Spouse name: Carlton Adam   Number of children: 1   Years of education: 12   Highest education level: Some college, no degree  Occupational History   Occupation: Retired    Comment: [employer]  Tobacco Use   Smoking status: Former    Types: Cigarettes    Quit date: 08/14/2007    Years since quitting: 13.5   Smokeless tobacco: Never  Vaping Use   Vaping Use: Never used  Substance and Sexual Activity   Alcohol use: No   Drug use: No   Sexual activity: Yes    Birth control/protection: Post-menopausal  Other Topics Concern   Not on file  Social History Narrative   Retired from [employer] age 53   She has 1 daughter who is 56 years old and lives in [place])   She has 2  grandsons age 1 and 36.   She has a significant other, Landscape architect.     No pets   She loves crafting and thrift stores   Social Determinants of Health   Financial Resource Strain: Low Risk    Difficulty of Paying Living Expenses: Not hard at all  Food Insecurity: No Food Insecurity   Worried About Charity fundraiser in the Last Year: Never true   Arboriculturist in the Last Year: Never true  Transportation Needs: No Transportation Needs   Lack of Transportation (Medical): No   Lack of Transportation (Non-Medical): No  Physical Activity: Inactive   Days of Exercise per Week: 0 days   Minutes of Exercise per Session: 0 min  Stress: No Stress Concern Present   Feeling of Stress : Not at all  Social Connections: Moderately Isolated   Frequency of Communication with Friends and Family: More than three times a week   Frequency of Social Gatherings with Friends and Family: Three times a week   Attends Religious Services: Never   Active Member of Clubs or Organizations: No   Attends Music therapist: Never   Marital Status: Living with partner    Tobacco Counseling Counseling given: Not Answered   Clinical Intake:  Pre-visit preparation completed: Yes  Pain : No/denies pain     BMI - recorded: 26.93 Nutritional Status: BMI 25 -29 Overweight Nutritional Risks: None Diabetes: No  How often do you need to have someone help you when you read instructions, pamphlets, or other written materials from your doctor or pharmacy?: 1 - Never  Diabetic? No  Interpreter Needed?: No  Information entered by :: Jade Burright, LPN   Activities of Daily Living In your present state of health, do you have any difficulty performing the following activities: 03/03/2021  Hearing? N  Vision? N  Difficulty concentrating or making decisions? N  Walking or climbing stairs? N  Dressing or bathing? N  Doing errands, shopping? N  Preparing Food and eating ? N  Using the  Toilet? N  In the past six months, have you accidently leaked urine? N  Do you have problems with loss of bowel control? N  Managing your Medications? N  Managing your Finances? N  Housekeeping or managing your Housekeeping? N  Some recent data might be hidden    Patient Care Team: [name], DO as PCP - General (Family Medicine) Rogene Houston, MD as Consulting Physician (Gastroenterology) Gaynelle Arabian, MD as Consulting Physician (Orthopedic Surgery) Ilean China, RN as Registered Nurse Corliss Parish, MD as Consulting Physician (Nephrology) Warren Danes, PA-C as Physician Assistant (Dermatology)  Indicate any recent Medical Services you may have received from other than [facility] providers in the past year (date may be approximate).     Assessment:   This is a routine wellness examination for Angela Parrish.  Hearing/Vision screen Hearing Screening - Comments:: Denies hearing difficulties  Vision Screening - Comments:: Denies vision difficulties - up to date with annual eye exam with Dr Marin Comment in [place]  Dietary issues and exercise activities discussed: Current Exercise Habits: The patient does not participate in regular exercise at present, Exercise limited by: orthopedic condition(s);respiratory conditions(s)   Goals Addressed             This Visit's Progress    DIET - INCREASE WATER INTAKE   On track    Try to drink 6-8 glasses of water daily.     Exercise 150 min/wk Moderate Activity   Not on track    After hip surgery increase activity level slowly. Aim for at least 150 minutes a week.      Exercise 3x per week (30 min per time)   Not on track    Have 3 meals a day   On track      Depression Screen PHQ 2/9 Scores 03/03/2021 02/28/2021 08/30/2020 02/27/2020 08/29/2019 07/29/2019 07/04/2019  PHQ - 2 Score 0 0 0 0 0 0 0  PHQ- 9 Score - - - - - - -    Fall Risk Fall Risk  03/03/2021 02/28/2021 08/30/2020 02/27/2020 08/29/2019  Falls in the past year? 0 0 0 0 0   Number falls in past yr: 0 - - - -  Injury with Fall? 0 - - - -  Risk for fall due to : Orthopedic patient - - - -  Follow up Falls prevention discussed - - - -  Comment - - - - -    [place]:  Any stairs in or around the home? Yes  If so, are there any without handrails? No  Home free of loose throw rugs in walkways, pet beds, electrical cords, etc? Yes  Adequate lighting in your home to reduce risk of falls? Yes   ASSISTIVE DEVICES UTILIZED TO PREVENT FALLS:  Life alert? No  Use of a cane, walker or w/c? No  Grab bars in the bathroom? No  Shower chair or bench in shower? No  Elevated toilet seat or a handicapped toilet? No   TIMED UP AND GO:  Was the test performed? No . Telephonic visit  Cognitive Function: Normal cognitive status assessed by direct observation by this Nurse Health Advisor. No abnormalities found.   MMSE - Mini Mental State Exam 05/17/2018 11/22/2016  Orientation to time 5 5  Orientation to Place 5 5  Registration 3 3  Attention/ Calculation 5 5  Recall 2 3  Language- name 2 objects 2 2  Language- repeat 1 1  Language- follow 3 step command 3 3  Language- read & follow direction 1 1  Write a sentence 1 1  Copy design 1 1  Total score 29 30     6CIT Screen 03/03/2021 05/19/2019  What Year? 0 points 0 points  What month? 0 points 0 points  What time? 0 points 0 points  Count back from 20 0 points 0 points  Months in reverse 0 points 0 points  Repeat phrase 0 points 2 points  Total Score 0 2    Immunizations Immunization History  Administered Date(s) Administered   Fluad Quad(high Dose 65+) 05/05/2019, 05/26/2020   Influenza, High Dose Seasonal PF 07/11/2016, 06/13/2017, 05/17/2018   Moderna Sars-Covid-2 Vaccination 10/21/2019, 11/18/2019, 07/06/2020   PFIZER Comirnaty(Gray Top)Covid-19 Tri-Sucrose Vaccine 03/01/2021   Pneumococcal Conjugate-13 06/20/2017   Pneumococcal Polysaccharide-23 06/25/2014   Tdap  11/22/2016   Zoster, Live 09/19/2008    TDAP status: Up to date  Flu Vaccine status: Up to date  Pneumococcal vaccine status: Up to date  Covid-19 vaccine status: Completed vaccines  Qualifies for Shingles Vaccine? Yes   Zostavax completed Yes   Shingrix Completed?: No.    Education has been provided regarding the importance of this vaccine. Patient has been advised to call insurance company to determine out of pocket expense if they have not yet received this vaccine. Advised may also receive vaccine at local pharmacy or Health Dept. Verbalized acceptance and understanding.  Screening Tests Health Maintenance  Topic Date Due   Zoster Vaccines- Shingrix (1 of 2) 05/31/2021 (Originally 04/29/1960)   INFLUENZA VACCINE  03/21/2021   MAMMOGRAM  05/14/2021   DEXA SCAN  05/26/2021   COVID-19 Vaccine (5 - Booster for Moderna series) 07/02/2021   TETANUS/TDAP  11/23/2026   PNA vac Low Risk Adult  Completed   HPV VACCINES  Aged Out    Health Maintenance  There are no preventive care reminders to display for this patient.  Colorectal cancer screening: No longer required.   Mammogram status: Completed 05/14/20. Repeat every year  Bone Density status: Completed 10/6/221. Results reflect: Bone density results: OSTEOPOROSIS. Repeat every 1 years.  Lung Cancer Screening: (Low Dose CT Chest recommended if Age 58-80 years, 30 pack-year currently  smoking OR have quit w/in 15years.) does not qualify.   Additional Screening:  Hepatitis C Screening: does not qualify  Vision Screening: Recommended annual ophthalmology exams for early detection of glaucoma and other disorders of the eye. Is the patient up to date with their annual eye exam?  Yes  Who is the provider or what is the name of the office in which the patient attends annual eye exams? [facility] in [place] If pt is not established with a provider, would they like to be referred to a provider to establish care? No .   Dental Screening:  Recommended annual dental exams for proper oral hygiene  Community Resource Referral / Chronic Care Management: CRR required this visit?  No   CCM required this visit?  No      Plan:     I have personally reviewed and noted the following in the patient's chart:   Medical and social history Use of alcohol, tobacco or illicit drugs  Current medications and supplements including opioid prescriptions.  Functional ability and status Nutritional status Physical activity Advanced directives List of other physicians Hospitalizations, surgeries, and ER visits in previous 12 months Vitals Screenings to include cognitive, depression, and falls Referrals and appointments  In addition, I have reviewed and discussed with patient certain preventive protocols, quality metrics, and best practice recommendations. A written personalized care plan for preventive services as well as general preventive health recommendations were provided to patient.     Sandrea Hammond, LPN   [id number]   Nurse Notes: None

## 2021-03-03 NOTE — Patient Instructions (Signed)
Angela Parrish , Thank you for taking time to come for your Medicare Wellness Visit. I appreciate your ongoing commitment to your health goals. Please review the following plan we discussed and let me know if I can assist you in the future.   Screening recommendations/referrals: Colonoscopy: Done 09/16/2014 - Repeat not required Mammogram: Done 05/14/2020 - Repeat annually  Bone Density: Done 05/26/2020 - Repeat in 1 year Recommended yearly ophthalmology/optometry visit for glaucoma screening and checkup Recommended yearly dental visit for hygiene and checkup  Vaccinations: Influenza vaccine: Done 05/26/2020 - Repeat annually  Pneumococcal vaccine: Done 06/25/2014 & 06/20/2017 Tdap vaccine: Done 11/22/2016 - Repeat in 10 years  Shingles vaccine: Zostavax done 09/19/2008; Due for Shingrix discussed. Please contact your pharmacy for coverage information.     Covid-19:Done 10/21/19, 11/18/19, 07/06/20, & 03/01/21  Conditions/risks identified: Aim for 30 minutes of exercise or brisk walking each day, drink 6-8 glasses of water and eat lots of fruits and vegetables - refer to osteoporosis diet plan at end of summary   Next appointment: Follow up in one year for your annual wellness visit    Preventive Care 65 Years and Older, Female Preventive care refers to lifestyle choices and visits with your health care provider that can promote health and wellness. What does preventive care include? A yearly physical exam. This is also called an annual well check. Dental exams once or twice a year. Routine eye exams. Ask your health care provider how often you should have your eyes checked. Personal lifestyle choices, including: Daily care of your teeth and gums. Regular physical activity. Eating a healthy diet. Avoiding tobacco and drug use. Limiting alcohol use. Practicing safe sex. Taking low-dose aspirin every day. Taking vitamin and mineral supplements as recommended by your health care provider. What  happens during an annual well check? The services and screenings done by your health care provider during your annual well check will depend on your age, overall health, lifestyle risk factors, and family history of disease. Counseling  Your health care provider may ask you questions about your: Alcohol use. Tobacco use. Drug use. Emotional well-being. Home and relationship well-being. Sexual activity. Eating habits. History of falls. Memory and ability to understand (cognition). Work and work Statistician. Reproductive health. Screening  You may have the following tests or measurements: Height, weight, and BMI. Blood pressure. Lipid and cholesterol levels. These may be checked every 5 years, or more frequently if you are over 89 years old. Skin check. Lung cancer screening. You may have this screening every year starting at age 44 if you have a 30-pack-year history of smoking and currently smoke or have quit within the past 15 years. Fecal occult blood test (FOBT) of the stool. You may have this test every year starting at age 97. Flexible sigmoidoscopy or colonoscopy. You may have a sigmoidoscopy every 5 years or a colonoscopy every 10 years starting at age 83. Hepatitis C blood test. Hepatitis B blood test. Sexually transmitted disease (STD) testing. Diabetes screening. This is done by checking your blood sugar (glucose) after you have not eaten for a while (fasting). You may have this done every 1-3 years. Bone density scan. This is done to screen for osteoporosis. You may have this done starting at age 33. Mammogram. This may be done every 1-2 years. Talk to your health care provider about how often you should have regular mammograms. Talk with your health care provider about your test results, treatment options, and if necessary, the need for more tests.  Vaccines  Your health care provider may recommend certain vaccines, such as: Influenza vaccine. This is recommended every  year. Tetanus, diphtheria, and acellular pertussis (Tdap, Td) vaccine. You may need a Td booster every 10 years. Zoster vaccine. You may need this after age 16. Pneumococcal 13-valent conjugate (PCV13) vaccine. One dose is recommended after age 75. Pneumococcal polysaccharide (PPSV23) vaccine. One dose is recommended after age 18. Talk to your health care provider about which screenings and vaccines you need and how often you need them. This information is not intended to replace advice given to you by your health care provider. Make sure you discuss any questions you have with your health care provider. Document Released: 09/03/2015 Document Revised: 04/26/2016 Document Reviewed: 06/08/2015 Elsevier Interactive Patient Education  2017 [place] Prevention in the Home Falls can cause injuries. They can happen to people of all ages. There are many things you can do to make your home safe and to help prevent falls. What can I do on the outside of my home? Regularly fix the edges of walkways and driveways and fix any cracks. Remove anything that might make you trip as you walk through a door, such as a raised step or threshold. Trim any bushes or trees on the path to your home. Use bright outdoor lighting. Clear any walking paths of anything that might make someone trip, such as rocks or tools. Regularly check to see if handrails are loose or broken. Make sure that both sides of any steps have handrails. Any raised decks and porches should have guardrails on the edges. Have any leaves, snow, or ice cleared regularly. Use sand or salt on walking paths during winter. Clean up any spills in your garage right away. This includes oil or grease spills. What can I do in the bathroom? Use night lights. Install grab bars by the toilet and in the tub and shower. Do not use towel bars as grab bars. Use non-skid mats or decals in the tub or shower. If you need to sit down in the shower, use a  plastic, non-slip stool. Keep the floor dry. Clean up any water that spills on the floor as soon as it happens. Remove soap buildup in the tub or shower regularly. Attach bath mats securely with double-sided non-slip rug tape. Do not have throw rugs and other things on the floor that can make you trip. What can I do in the bedroom? Use night lights. Make sure that you have a light by your bed that is easy to reach. Do not use any sheets or blankets that are too big for your bed. They should not hang down onto the floor. Have a firm chair that has side arms. You can use this for support while you get dressed. Do not have throw rugs and other things on the floor that can make you trip. What can I do in the kitchen? Clean up any spills right away. Avoid walking on wet floors. Keep items that you use a lot in easy-to-reach places. If you need to reach something above you, use a strong step stool that has a grab bar. Keep electrical cords out of the way. Do not use floor polish or wax that makes floors slippery. If you must use wax, use non-skid floor wax. Do not have throw rugs and other things on the floor that can make you trip. What can I do with my stairs? Do not leave any items on the stairs. Make sure that  there are handrails on both sides of the stairs and use them. Fix handrails that are broken or loose. Make sure that handrails are as long as the stairways. Check any carpeting to make sure that it is firmly attached to the stairs. Fix any carpet that is loose or worn. Avoid having throw rugs at the top or bottom of the stairs. If you do have throw rugs, attach them to the floor with carpet tape. Make sure that you have a light switch at the top of the stairs and the bottom of the stairs. If you do not have them, ask someone to add them for you. What else can I do to help prevent falls? Wear shoes that: Do not have high heels. Have rubber bottoms. Are comfortable and fit you  well. Are closed at the toe. Do not wear sandals. If you use a stepladder: Make sure that it is fully opened. Do not climb a closed stepladder. Make sure that both sides of the stepladder are locked into place. Ask someone to hold it for you, if possible. Clearly mark and make sure that you can see: Any grab bars or handrails. First and last steps. Where the edge of each step is. Use tools that help you move around (mobility aids) if they are needed. These include: Canes. Walkers. Scooters. Crutches. Turn on the lights when you go into a dark area. Replace any light bulbs as soon as they burn out. Set up your furniture so you have a clear path. Avoid moving your furniture around. If any of your floors are uneven, fix them. If there are any pets around you, be aware of where they are. Review your medicines with your doctor. Some medicines can make you feel dizzy. This can increase your chance of falling. Ask your doctor what other things that you can do to help prevent falls. This information is not intended to replace advice given to you by your health care provider. Make sure you discuss any questions you have with your health care provider. Document Released: 06/03/2009 Document Revised: 01/13/2016 Document Reviewed: 09/11/2014 Elsevier Interactive Patient Education  2017 [place] for Osteoporosis Osteoporosis causes your bones to become weak and brittle. This puts you at greater risk for bone breaks (fractures) from small bumps or falls. Making changes to your diet and increasing your physical activity can help strengthen your bones and improve your overallhealth. Calcium and vitamin D are nutrients that play an important role in bone health. Vitamin D helps your body use calcium and strengthen bones. It is important toget enough calcium and vitamin D as part of your eating plan for osteoporosis. What are tips for following this plan? Reading food labels Try to get  at least 1,000 milligrams (mg) of calcium each day. Look for foods that have at least 50 mg of calcium per serving. Talk with your health care provider about taking a calcium supplement if you do not get enough calcium from food. Do not have more than 2,500 mg of calcium each day. This is the upper limit for food and nutritional supplements combined. Too much calcium may cause constipation and prevent you from absorbing other important nutrients. Choose foods that contain vitamin D. Take a daily vitamin supplement that contains 800-1,000 international units (IU) of vitamin D. The amount may be different depending on your age, body weight, and where you live. Talk with your dietitian or health care provider about how much vitamin D is right for you.  Avoid foods that have more than 300 mg of sodium per serving. Too much sodium can cause your body to lose calcium. Talk with your dietitian or health care provider about how much sodium you are allowed each day. Shopping Do not buy foods with added salt, including: Salted snacks. Angie Fava. Canned soups. Canned meats. Processed meats, such as bacon or precooked or cured meat like sausages or meat loaves. Smoked fish. Meal planning Eat balanced meals that contain protein foods, fruits and vegetables, and foods rich in calcium and vitamin D. Eat at least 5 servings of fruits and vegetables each day. Eat 5-6 oz (142-170 g) of lean meat, poultry, fish, eggs, or beans each day. Lifestyle Do not use any products that contain nicotine or tobacco, such as cigarettes, e-cigarettes, and chewing tobacco. If you need help quitting, ask your health care provider. If your health care provider recommends that you lose weight: Work with a dietitian to develop an eating plan that will help you reach your desired weight goal. Exercise for at least 30 minutes a day, 5 or more days a week, or as told by your health care provider. Work with a physical therapist to  develop an exercise plan that includes flexibility, balance, and strength exercises. Do not focus only on aerobic exercise. Do not drink alcohol if: Your health care provider tells you not to drink. You are pregnant, may be pregnant, or are planning to become pregnant. If you drink alcohol: Limit how much you use to: 0-1 drink a day for women. 0-2 drinks a day for men. Be aware of how much alcohol is in your drink. In the [place] one drink equals one 12 oz bottle of beer (355 mL), one 5 oz glass of wine (148 mL), or one 1 oz glass of hard liquor (44 mL). What foods should I eat? Foods high in calcium  Yogurt. Yogurt with fruit. Milk. Evaporated skim milk. Dry milk powder. Calcium-fortified orange juice. Parmesan cheese. Part-skim ricotta cheese. Natural hard cheese. Cream cheese. Cottage cheese. Canned sardines. Canned salmon. Calcium-treated tofu. Calcium-fortified cereal bar. Calcium-fortified cereal. Calcium-fortified graham crackers. Cooked collard greens. Turnip greens. Broccoli. Kale. Almonds. White beans. Corn tortilla.  Foods high in vitamin D Cod liver oil. Fatty fish, such as tuna, mackerel, and salmon. Milk. Fortified soy milk. Fortified fruit juice. Yogurt. Margarine. Egg yolks. Foods high in protein Beef. Lamb. Pork tenderloin. Chicken breast. Tuna (canned). Fish fillet. Tofu. Cooked soy beans. Soy patty. Beans (canned or cooked). Cottage cheese. Yogurt. Peanut butter. Pumpkin seeds. Nuts. Sunflower seeds. Hard cheese. Milk or other milk products, such as soy milk. The items listed above may not be a complete list of foods and beverages you can eat. Contact a dietitian for more options. Summary Calcium and vitamin D are nutrients that play an important role in bone health and are an important part of your eating plan for osteoporosis. Eat balanced meals that contain protein foods, fruits and vegetables, and foods rich in calcium and vitamin D. Avoid foods that  have more than 300 mg of sodium per serving. Too much sodium can cause your body to lose calcium. Exercise is an important part of prevention and treatment of osteoporosis. Aim for at least 30 minutes a day, 5 days a week. This information is not intended to replace advice given to you by your health care provider. Make sure you discuss any questions you have with your healthcare provider. Document Revised: 01/22/2020 Document Reviewed: 01/22/2020 Elsevier Patient Education  [employer].

## 2021-03-15 ENCOUNTER — Telehealth: Payer: Self-pay | Admitting: Family Medicine

## 2021-03-15 ENCOUNTER — Other Ambulatory Visit: Payer: Self-pay | Admitting: Family Medicine

## 2021-03-15 DIAGNOSIS — J410 Simple chronic bronchitis: Secondary | ICD-10-CM

## 2021-03-15 MED ORDER — TRELEGY ELLIPTA 200-62.5-25 MCG/INH IN AEPB: 1.0000 | INHALATION_SPRAY | Freq: Every day | RESPIRATORY_TRACT | 3 refills | Status: DC

## 2021-03-15 NOTE — Telephone Encounter (Signed)
Pt was given sample at last visit has called in to get it sent to mail order pharmacy

## 2021-03-15 NOTE — Telephone Encounter (Signed)
Pt aware prescription sent to pharmacy

## 2021-03-15 NOTE — Telephone Encounter (Signed)
med

## 2021-03-15 NOTE — Telephone Encounter (Signed)
  Prescription Request  03/15/2021  What is the name of the medication or equipment? trelegy  Have you contacted your pharmacy to request a refill? (if applicable) no  Which pharmacy would you like this sent to? [facility] mail order   Patient notified that their request is being sent to the clinical staff for review and that they should receive a response within 2 business days.

## 2021-03-15 NOTE — Telephone Encounter (Signed)
  Prescription Request  03/15/2021  What is the name of the medication or equipment? Prolia. Pt said if we send in rx the insurance company will bill her for shot and will send [place] the meds  Have you contacted your pharmacy to request a refill? (if applicable) no  Which pharmacy would you like this sent to? [facility].   Patient notified that their request is being sent to the clinical staff for review and that they should receive a response within 2 business days.

## 2021-03-16 NOTE — Telephone Encounter (Signed)
Submitted benefit verification

## 2021-03-31 DIAGNOSIS — C44519 Basal cell carcinoma of skin of other part of trunk: Secondary | ICD-10-CM | POA: Diagnosis not present

## 2021-03-31 DIAGNOSIS — L905 Scar conditions and fibrosis of skin: Secondary | ICD-10-CM | POA: Diagnosis not present

## 2021-04-05 ENCOUNTER — Other Ambulatory Visit: Payer: Self-pay | Admitting: Family Medicine

## 2021-04-05 DIAGNOSIS — Z1231 Encounter for screening mammogram for malignant neoplasm of breast: Secondary | ICD-10-CM

## 2021-04-14 ENCOUNTER — Other Ambulatory Visit: Payer: Medicare HMO

## 2021-04-14 ENCOUNTER — Telehealth: Payer: Self-pay | Admitting: Family Medicine

## 2021-04-14 ENCOUNTER — Other Ambulatory Visit: Payer: Self-pay

## 2021-04-14 DIAGNOSIS — N184 Chronic kidney disease, stage 4 (severe): Secondary | ICD-10-CM | POA: Diagnosis not present

## 2021-04-14 NOTE — Telephone Encounter (Signed)
[facility] patient states she is due in September

## 2021-05-02 DIAGNOSIS — G44219 Episodic tension-type headache, not intractable: Secondary | ICD-10-CM | POA: Diagnosis not present

## 2021-05-02 DIAGNOSIS — H40033 Anatomical narrow angle, bilateral: Secondary | ICD-10-CM | POA: Diagnosis not present

## 2021-05-09 ENCOUNTER — Telehealth: Payer: Self-pay | Admitting: Family Medicine

## 2021-05-11 ENCOUNTER — Other Ambulatory Visit: Payer: Self-pay | Admitting: Family Medicine

## 2021-05-11 DIAGNOSIS — J410 Simple chronic bronchitis: Secondary | ICD-10-CM

## 2021-05-13 MED ORDER — DENOSUMAB 60 MG/ML ~~LOC~~ SOSY: 60.0000 mg | PREFILLED_SYRINGE | Freq: Once | SUBCUTANEOUS | 0 refills | Status: AC

## 2021-05-13 NOTE — Telephone Encounter (Signed)
Prolia sent to [facility]) for patient. We will let patient know when we receive we will call her to schedule. Patient aware.

## 2021-05-17 ENCOUNTER — Telehealth: Payer: Self-pay | Admitting: Family Medicine

## 2021-05-17 ENCOUNTER — Ambulatory Visit
Admission: RE | Admit: 2021-05-17 | Discharge: 2021-05-17 | Disposition: A | Discharge status: Home / Self Care | Payer: Medicare HMO | Source: Ambulatory Visit | Attending: Family Medicine | Admitting: Family Medicine

## 2021-05-17 ENCOUNTER — Other Ambulatory Visit: Payer: Self-pay

## 2021-05-17 DIAGNOSIS — Z1231 Encounter for screening mammogram for malignant neoplasm of breast: Secondary | ICD-10-CM | POA: Diagnosis not present

## 2021-05-17 NOTE — Telephone Encounter (Signed)
No apparent contraindications to doing both.

## 2021-05-17 NOTE — Telephone Encounter (Signed)
lmtcb

## 2021-05-23 ENCOUNTER — Other Ambulatory Visit: Payer: Self-pay

## 2021-05-23 ENCOUNTER — Ambulatory Visit (INDEPENDENT_AMBULATORY_CARE_PROVIDER_SITE_OTHER): Payer: Medicare HMO

## 2021-05-23 DIAGNOSIS — Z23 Encounter for immunization: Secondary | ICD-10-CM

## 2021-05-27 NOTE — Telephone Encounter (Signed)
Unable to reach patient by phone, encounter closed.

## 2021-05-27 NOTE — Telephone Encounter (Deleted)
Unable to reach patient by phone, mailed letter with results.

## 2021-05-30 ENCOUNTER — Telehealth: Payer: Self-pay | Admitting: Family Medicine

## 2021-05-30 NOTE — Telephone Encounter (Signed)
Here and pt is scheduled for 05/31/21.

## 2021-05-31 ENCOUNTER — Ambulatory Visit (INDEPENDENT_AMBULATORY_CARE_PROVIDER_SITE_OTHER): Payer: Medicare HMO

## 2021-05-31 ENCOUNTER — Other Ambulatory Visit: Payer: Self-pay

## 2021-05-31 DIAGNOSIS — M810 Age-related osteoporosis without current pathological fracture: Secondary | ICD-10-CM | POA: Diagnosis not present

## 2021-05-31 NOTE — Progress Notes (Signed)
Prolia injection given to left arm Patient tolerated well Patient supplied 

## 2021-06-03 ENCOUNTER — Other Ambulatory Visit: Payer: Self-pay | Admitting: Family Medicine

## 2021-06-03 DIAGNOSIS — I10 Essential (primary) hypertension: Secondary | ICD-10-CM

## 2021-06-25 ENCOUNTER — Other Ambulatory Visit: Payer: Self-pay | Admitting: Family Medicine

## 2021-06-25 DIAGNOSIS — N184 Chronic kidney disease, stage 4 (severe): Secondary | ICD-10-CM

## 2021-06-25 DIAGNOSIS — E559 Vitamin D deficiency, unspecified: Secondary | ICD-10-CM

## 2021-06-25 DIAGNOSIS — M810 Age-related osteoporosis without current pathological fracture: Secondary | ICD-10-CM

## 2021-07-05 DIAGNOSIS — Z23 Encounter for immunization: Secondary | ICD-10-CM | POA: Diagnosis not present

## 2021-07-07 DIAGNOSIS — I129 Hypertensive chronic kidney disease with stage 1 through stage 4 chronic kidney disease, or unspecified chronic kidney disease: Secondary | ICD-10-CM | POA: Diagnosis not present

## 2021-07-07 DIAGNOSIS — N184 Chronic kidney disease, stage 4 (severe): Secondary | ICD-10-CM | POA: Diagnosis not present

## 2021-07-13 DIAGNOSIS — Z96641 Presence of right artificial hip joint: Secondary | ICD-10-CM | POA: Diagnosis not present

## 2021-08-06 DIAGNOSIS — R5383 Other fatigue: Secondary | ICD-10-CM | POA: Diagnosis not present

## 2021-08-08 ENCOUNTER — Encounter: Payer: Self-pay | Admitting: Nurse Practitioner

## 2021-08-08 ENCOUNTER — Ambulatory Visit (INDEPENDENT_AMBULATORY_CARE_PROVIDER_SITE_OTHER): Payer: Medicare HMO

## 2021-08-08 ENCOUNTER — Ambulatory Visit (INDEPENDENT_AMBULATORY_CARE_PROVIDER_SITE_OTHER): Payer: Medicare HMO | Admitting: Nurse Practitioner

## 2021-08-08 VITALS — BP 145/80 | HR 73 | Temp 97.4°F | Resp 20 | Ht 63.0 in | Wt 150.0 lb

## 2021-08-08 DIAGNOSIS — R051 Acute cough: Secondary | ICD-10-CM

## 2021-08-08 DIAGNOSIS — R059 Cough, unspecified: Secondary | ICD-10-CM | POA: Diagnosis not present

## 2021-08-08 DIAGNOSIS — I959 Hypotension, unspecified: Secondary | ICD-10-CM | POA: Diagnosis not present

## 2021-08-08 MED ORDER — PREDNISONE 20 MG PO TABS: 40.0000 mg | ORAL_TABLET | Freq: Every day | ORAL | 0 refills | Status: AC

## 2021-08-08 MED ORDER — AZITHROMYCIN 250 MG PO TABS: ORAL_TABLET | ORAL | 0 refills | Status: DC

## 2021-08-08 NOTE — Addendum Note (Signed)
Addended by: Chevis Pretty on: 08/08/2021 01:24 PM   Modules accepted: Orders

## 2021-08-08 NOTE — Progress Notes (Addendum)
Subjective:    Patient ID: Angela Parrish, female    DOB: 1941-01-13, 80 y.o.   MRN: [id number]  Patient said she started getting sicm a week ago Saturday. Had congestion and headache. She was taking hycodan that she had left over form previous illiness. SHe went to urgent care on Saturday and they could not find anything worng with ehr so they did  not give her any meds. Blood pressure has dropped to 76'E systolic. Has felt syncopial.   BP Readings from Last 3 Encounters:  08/08/21 (!) 145/80  02/28/21 116/66  08/30/20 (!) 95/58      Review of Systems  Constitutional:  Positive for fatigue. Negative for chills and fever.  HENT:  Positive for congestion and rhinorrhea. Negative for sinus pressure, sinus pain and sore throat.   Respiratory:  Positive for cough. Negative for shortness of breath.   Musculoskeletal:  Negative for myalgias.  Neurological:  Negative for dizziness and headaches.      Objective:   Physical Exam Vitals and nursing note reviewed.  Constitutional:      General: She is not in acute distress.    Appearance: Normal appearance. She is well-developed.  HENT:     Head: Normocephalic.     Right Ear: Tympanic membrane normal.     Left Ear: Tympanic membrane normal.     Nose: Congestion and rhinorrhea present.     Mouth/Throat:     Mouth: Mucous membranes are moist.  Eyes:     Pupils: Pupils are equal, round, and reactive to light.  Neck:     Vascular: No carotid bruit or JVD.  Cardiovascular:     Rate and Rhythm: Normal rate and regular rhythm.     Heart sounds: Normal heart sounds.  Pulmonary:     Effort: Pulmonary effort is normal. No respiratory distress.     Breath sounds: Normal breath sounds. No wheezing or rales.     Comments: Diminished breath sounds left upper lobe Became sob when walking to exam room. Chest:     Chest wall: No tenderness.  Abdominal:     General: Bowel sounds are normal. There is no distension or abdominal bruit.      Palpations: Abdomen is soft. There is no hepatomegaly, splenomegaly, mass or pulsatile mass.     Tenderness: There is no abdominal tenderness.  Musculoskeletal:        General: Normal range of motion.     Cervical back: Normal range of motion and neck supple.  Lymphadenopathy:     Cervical: No cervical adenopathy.  Skin:    General: Skin is warm and dry.  Neurological:     Mental Status: She is alert and oriented to person, place, and time.     Deep Tendon Reflexes: Reflexes are normal and symmetric.  Psychiatric:        Behavior: Behavior normal.        Thought Content: Thought content normal.        Judgment: Judgment normal.    BP (!) 145/80    Pulse 73    Temp (!) 97.4 F (36.3 C) (Temporal)    Resp 20    Ht 5' 3" (1.6 m)    Wt 150 lb (68 kg)    SpO2 97%    BMI 26.57 kg/m   Chest xray- possible early pneumonia-Preliminary reading by Ronnald Collum, FNP  [facility]      Assessment & Plan:  Angela Parrish in today with chief complaint  of Nasal Congestion (Seen at Urgent Care Saturday and they didn't give her anything because she said they couldn't find anything wrong with her) and BP has been dropping   1. Acute cough Force fluids rest - DG Chest 2 View - predniSONE (DELTASONE) 20 MG tablet; Take 2 tablets (40 mg total) by mouth daily with breakfast for 5 days. 2 po daily for 5 days  Dispense: 10 tablet; Refill: 0  2. Hypotension, unspecified hypotension type Keep diary of blood pressure Add salt to diet Labs pending - CMP14+EGFR - CBC with Differential/Platelet  3. Possible early pneumoinia - z pak as directed  The above assessment and management plan was discussed with the patient. The patient verbalized understanding of and has agreed to the management plan. Patient is aware to call the clinic if symptoms persist or worsen. Patient is aware when to return to the clinic for a follow-up visit. Patient educated on when it is appropriate to go to the emergency department.    Mary-Margaret Hassell Done, FNP

## 2021-08-08 NOTE — Patient Instructions (Signed)
Hypotension As your heart beats, it forces blood through your body. This force is called blood pressure. If you have hypotension, you have low blood pressure.  When your blood pressure is too low, you may not get enough blood to your brain or other parts of your body. This may cause you to feel weak, light-headed, have a fast heartbeat, or even faint. Low blood pressure may be harmless, or it may cause serious problems. What are the causes? Blood loss. Not enough water in the body (dehydration). Heart problems. Hormone problems. Pregnancy. A very bad infection. Not having enough of certain nutrients. Very bad allergic reactions. Certain medicines. What increases the risk? Age. The risk increases as you get older. Conditions that affect the heart or the brain and spinal cord (central nervous system). What are the signs or symptoms? Feeling: Weak. Light-headed. Dizzy. Tired (fatigued). Blurred vision. Fast heartbeat. Fainting, in very bad cases. How is this treated? Changing your diet. This may involve drinking more water or including more salt (sodium) in your diet by eating high-salt foods. Taking medicines to raise your blood pressure. Changing how much you take (the dosage) of some of your medicines. Wearing compression stockings. These stockings help to prevent blood clots and reduce swelling in your legs. In some cases, you may need to go to the hospital to: Receive fluids through an IV tube. Receive donated blood through an IV tube (transfusion). Get treated for an infection or heart problems, if this applies. Be monitored while medicines that you are taking wear off. Follow these instructions at home: Eating and drinking  Drink enough fluids to keep your pee (urine) pale yellow. Eat a healthy diet. Follow instructions from your doctor about what you can eat or drink. A healthy diet includes: Fresh fruits and vegetables. Whole grains. Low-fat (lean) meats. Low-fat  dairy products. If told, include more salt in your diet. Do not add extra salt to your diet unless your doctor tells you to. Eat small meals often. Avoid standing up quickly after you eat. Medicines Take over-the-counter and prescription medicines only as told by your doctor. Follow instructions from your doctor about changing how much you take of your medicines, if this applies. Do not stop or change any of your medicines on your own. General instructions  Wear compression stockings as told by your doctor. Get up slowly from lying down or sitting. Avoid hot showers and a lot of heat as told by your doctor. Return to your normal activities when your doctor says that it is safe. Do not smoke or use any products that contain nicotine or tobacco. If you need help quitting, ask your doctor. Keep all follow-up visits. Contact a doctor if: You vomit. You have watery poop (diarrhea). You have a fever for more than 2-3 days. You feel more thirsty than normal. You feel weak and tired. Get help right away if: You have chest pain. You have a fast or uneven heartbeat. You lose feeling (have numbness) in any part of your body. You cannot move your arms or your legs. You have trouble talking. You get sweaty or feel light-headed. You faint. You have trouble breathing. You have trouble staying awake. You feel mixed up (confused). These symptoms may be an emergency. Get help right away. Call 911. Do not wait to see if the symptoms will go away. Do not drive yourself to the hospital. Summary Hypotension is also called low blood pressure. It is when the force of blood pumping through your body  is too weak. Hypotension may be harmless, or it may cause serious problems. Treatment may include changing your diet and medicines, and wearing compression stockings. In very bad cases, you may need to go to the hospital. This information is not intended to replace advice given to you by your health care  provider. Make sure you discuss any questions you have with your health care provider. Document Revised: 03/28/2021 Document Reviewed: 03/28/2021 Elsevier Patient Education  [place].

## 2021-08-09 ENCOUNTER — Other Ambulatory Visit: Payer: Self-pay

## 2021-08-09 DIAGNOSIS — E875 Hyperkalemia: Secondary | ICD-10-CM

## 2021-08-09 LAB — CBC WITH DIFFERENTIAL/PLATELET
Basophils Absolute: 0.1 10*3/uL (ref 0.0–0.2)
Basos: 1 %
EOS (ABSOLUTE): 0.3 10*3/uL (ref 0.0–0.4)
Eos: 4 %
Hematocrit: 41.8 % (ref 34.0–46.6)
Hemoglobin: 14.1 g/dL (ref 11.1–15.9)
Immature Grans (Abs): 0 10*3/uL (ref 0.0–0.1)
Immature Granulocytes: 1 %
Lymphocytes Absolute: 1.2 10*3/uL (ref 0.7–3.1)
Lymphs: 16 %
MCH: 31.3 pg (ref 26.6–33.0)
MCHC: 33.7 g/dL (ref 31.5–35.7)
MCV: 93 fL (ref 79–97)
Monocytes Absolute: 0.7 10*3/uL (ref 0.1–0.9)
Monocytes: 10 %
Neutrophils Absolute: 5 10*3/uL (ref 1.4–7.0)
Neutrophils: 68 %
Platelets: 219 10*3/uL (ref 150–450)
RBC: 4.51 x10E6/uL (ref 3.77–5.28)
RDW: 12.6 % (ref 11.7–15.4)
WBC: 7.3 10*3/uL (ref 3.4–10.8)

## 2021-08-09 LAB — CMP14+EGFR
ALT: 16 IU/L (ref 0–32)
AST: 21 IU/L (ref 0–40)
Albumin/Globulin Ratio: 1.4 (ref 1.2–2.2)
Albumin: 4.3 g/dL (ref 3.6–4.6)
Alkaline Phosphatase: 77 IU/L (ref 44–121)
BUN/Creatinine Ratio: 10 — ABNORMAL LOW (ref 12–28)
BUN: 18 mg/dL (ref 8–27)
Bilirubin Total: 0.6 mg/dL (ref 0.0–1.2)
CO2: 20 mmol/L (ref 20–29)
Calcium: 9.4 mg/dL (ref 8.7–10.3)
Chloride: 105 mmol/L (ref 96–106)
Creatinine, Ser: 1.89 mg/dL — ABNORMAL HIGH (ref 0.57–1.00)
Globulin, Total: 3.1 g/dL (ref 1.5–4.5)
Glucose: 109 mg/dL — ABNORMAL HIGH (ref 70–99)
Potassium: 5.5 mmol/L — ABNORMAL HIGH (ref 3.5–5.2)
Sodium: 141 mmol/L (ref 134–144)
Total Protein: 7.4 g/dL (ref 6.0–8.5)
eGFR: 26 mL/min/{1.73_m2} — ABNORMAL LOW (ref >59)

## 2021-08-10 ENCOUNTER — Other Ambulatory Visit: Payer: Medicare HMO

## 2021-08-10 DIAGNOSIS — E875 Hyperkalemia: Secondary | ICD-10-CM | POA: Diagnosis not present

## 2021-08-10 LAB — BMP8+EGFR
BUN/Creatinine Ratio: 15 (ref 12–28)
BUN: 30 mg/dL — ABNORMAL HIGH (ref 8–27)
CO2: 18 mmol/L — ABNORMAL LOW (ref 20–29)
Calcium: 11.1 mg/dL — ABNORMAL HIGH (ref 8.7–10.3)
Chloride: 103 mmol/L (ref 96–106)
Creatinine, Ser: 2.03 mg/dL — ABNORMAL HIGH (ref 0.57–1.00)
Glucose: 99 mg/dL (ref 70–99)
Potassium: 4.6 mmol/L (ref 3.5–5.2)
Sodium: 137 mmol/L (ref 134–144)
eGFR: 24 mL/min/{1.73_m2} — ABNORMAL LOW (ref >59)

## 2021-08-18 ENCOUNTER — Ambulatory Visit: Payer: Medicare HMO | Admitting: Nurse Practitioner

## 2021-08-31 ENCOUNTER — Ambulatory Visit (INDEPENDENT_AMBULATORY_CARE_PROVIDER_SITE_OTHER): Payer: Medicare HMO

## 2021-08-31 ENCOUNTER — Ambulatory Visit (INDEPENDENT_AMBULATORY_CARE_PROVIDER_SITE_OTHER): Payer: Medicare HMO | Admitting: Family Medicine

## 2021-08-31 ENCOUNTER — Encounter: Payer: Self-pay | Admitting: Family Medicine

## 2021-08-31 VITALS — BP 121/66 | HR 88 | Temp 97.4°F | Ht 63.0 in | Wt 150.0 lb

## 2021-08-31 DIAGNOSIS — N184 Chronic kidney disease, stage 4 (severe): Secondary | ICD-10-CM

## 2021-08-31 DIAGNOSIS — Z8701 Personal history of pneumonia (recurrent): Secondary | ICD-10-CM | POA: Diagnosis not present

## 2021-08-31 DIAGNOSIS — I10 Essential (primary) hypertension: Secondary | ICD-10-CM | POA: Diagnosis not present

## 2021-08-31 DIAGNOSIS — R635 Abnormal weight gain: Secondary | ICD-10-CM

## 2021-08-31 DIAGNOSIS — Z23 Encounter for immunization: Secondary | ICD-10-CM

## 2021-08-31 DIAGNOSIS — J189 Pneumonia, unspecified organism: Secondary | ICD-10-CM | POA: Diagnosis not present

## 2021-08-31 NOTE — Progress Notes (Signed)
Subjective: CC:CKD PCP: [name], DO SPQ:ZRAQT Harvey Lembo is a 81 y.o. female presenting to clinic today for:  1. CKD4 w/ HTN She is compliant with recommendations by Dr. Moshe Cipro, her nephrologist.  Her last laboratory draw here did noted elevated calcium level.  She is being treated with high-dose vitamin D for osteoporosis.  Given her advanced kidney disease, she is not a candidate for bisphosphonates.  2.  Recent pneumonia Patient with recent pneumonia on right lobe.  She was treated with azithromycin and notes that she is recovering pretty well.  She continues to have some dyspnea with a lot of exertion but otherwise is feeling well.  3.  Abdominal obesity Patient reports that since she retired from [employer] she is really started developing a lot more abdominal fat.  She just wants to make sure is not swelling or anything else to be concerned about.  She admits that she is not as physically active as she had been but is aiming to get back into some type of regimen.   ROS: Per HPI  No Known Allergies Past Medical History:  Diagnosis Date   Abnormal Pap smear    Previous colposcopy   Acute pulmonary embolism ([place]) 09/21/2018   Arthritis    hip    Asthma    childhood     Basal cell carcinoma 07/08/2001   Right Forehead (curet, excision)   Breast cancer ([place]) 2000   Status post left lumpectomy   Closed fracture of third toe of right foot 03/13/2017   COPD (chronic obstructive pulmonary disease) (HCC)    Dyspnea    on exertion ; improved with use Spiriva    Headache    occ   Hyperlipidemia    Personal history of radiation therapy 2000   Right leg DVT ([place]) 09/22/2018    Current Outpatient Medications:    acetaminophen (TYLENOL) 325 MG tablet, Take 2 tablets (650 mg total) by mouth every 6 (six) hours as needed for mild pain (or Fever >/= 101)., Disp: , Rfl:    albuterol (PROVENTIL) (2.5 MG/3ML) 0.083% nebulizer solution, Take 3 mLs (2.5 mg total) by  nebulization every 6 (six) hours as needed for wheezing or shortness of breath., Disp: 360 mL, Rfl: 12   albuterol (VENTOLIN HFA) 108 (90 Base) MCG/ACT inhaler, INHALE 2 PUFFS INTO THE LUNGS EVERY 6 HOURS AS NEEDED FOR WHEEZING OR SHORTNESS OF BREATH., Disp: 18 each, Rfl: 0   atorvastatin (LIPITOR) 10 MG tablet, Take 1 tablet (10 mg total) by mouth daily., Disp: 90 tablet, Rfl: 3   Fluticasone-Umeclidin-Vilant (TRELEGY ELLIPTA) 200-62.5-25 MCG/INH AEPB, Inhale 1 puff into the lungs daily., Disp: 180 each, Rfl: 3   Vitamin D, Ergocalciferol, (DRISDOL) 1.25 MG (50000 UNIT) CAPS capsule, Take 1 capsule (50,000 Units total) by mouth every 7 (seven) days., Disp: 12 capsule, Rfl: 3  Current Facility-Administered Medications:    denosumab (PROLIA) injection 60 mg, 60 mg, Subcutaneous, Q6 months, Vasili Fok M, DO, 60 mg at 05/31/21 0932 Social History   Socioeconomic History   Marital status: Significant Other    Spouse name: Carlton Adam   Number of children: 1   Years of education: 12   Highest education level: Some college, no degree  Occupational History   Occupation: Retired    Comment: [employer]  Tobacco Use   Smoking status: Former    Types: Cigarettes    Quit date: 08/14/2007    Years since quitting: 14.0   Smokeless tobacco: Never  Vaping Use  Vaping Use: Never used  Substance and Sexual Activity   Alcohol use: No   Drug use: No   Sexual activity: Yes    Birth control/protection: Post-menopausal  Other Topics Concern   Not on file  Social History Narrative   Retired from [employer] age 61   She has 1 daughter who is 61 years old and lives in [place])   She has 2 grandsons age 41 and 67.   She has a significant other, Landscape architect.     No pets   She loves crafting and thrift stores   Social Determinants of Health   Financial Resource Strain: Low Risk    Difficulty of Paying Living Expenses: Not hard at all  Food Insecurity: No Food Insecurity   Worried  About Charity fundraiser in the Last Year: Never true   Arboriculturist in the Last Year: Never true  Transportation Needs: No Transportation Needs   Lack of Transportation (Medical): No   Lack of Transportation (Non-Medical): No  Physical Activity: Inactive   Days of Exercise per Week: 0 days   Minutes of Exercise per Session: 0 min  Stress: No Stress Concern Present   Feeling of Stress : Not at all  Social Connections: Moderately Isolated   Frequency of Communication with Friends and Family: More than three times a week   Frequency of Social Gatherings with Friends and Family: Three times a week   Attends Religious Services: Never   Active Member of Clubs or Organizations: No   Attends Music therapist: Never   Marital Status: Living with partner  Intimate Partner Violence: Not At Risk   Fear of Current or Ex-Partner: No   Emotionally Abused: No   Physically Abused: No   Sexually Abused: No   Family History  Problem Relation Age of Onset   Heart disease Father    Lung cancer Father        smoker   Heart attack Father    Colon cancer Mother 20   Heart attack Mother    Heart disease Sister    Hypertension Brother    Heart attack Brother    Leukemia Brother    Obesity Daughter    Anxiety disorder Daughter    Hypertension Brother     Objective: Office vital signs reviewed. BP 121/66    Pulse 88    Temp (!) 97.4 F (36.3 C) (Temporal)    Ht 5\' 3"  (1.6 m)    Wt 150 lb (68 kg)    BMI 26.57 kg/m   Physical Examination:  General: Awake, alert, well nourished, well-appearing female, No acute distress Cardio: regular rate and rhythm, S1S2 heard, no murmurs appreciated Pulm: clear to auscultation bilaterally, no wheezes, rhonchi or rales; normal work of breathing on room air GI: soft, non-tender, non-distended, bowel sounds present x4, no hepatomegaly, no splenomegaly, no masses.  Abdominal obesity present  Assessment/ Plan: 81 y.o. female   Chronic kidney  disease, stage 4 (severe) ([place]) - Plan: PTH, Intact and Calcium, VITAMIN D 25 Hydroxy (Vit-D Deficiency, Fractures), CANCELED: PTH, Intact and Calcium, CANCELED: VITAMIN D 25 Hydroxy (Vit-D Deficiency, Fractures)  Essential hypertension  Hypercalcemia - Plan: PTH, Intact and Calcium, VITAMIN D 25 Hydroxy (Vit-D Deficiency, Fractures), CANCELED: PTH, Intact and Calcium, CANCELED: VITAMIN D 25 Hydroxy (Vit-D Deficiency, Fractures)  History of recent pneumonia - Plan: DG Chest 2 View  Weight gain  Need for shingles vaccine  Recheck calcium, PTH and add vitamin D.  We discussed that vitamin D at high dose may be causing hypercalcemia.  Need to rule this out.  If no apparent etiology of the elevated calcium level I will reach out to Dr. Moshe Cipro for consideration of possible calcium binder?  Blood pressure is diet controlled.  No changes  Will repeat chest x-ray to ensure resolution of right infiltrates  We discussed ways to improve physical exercise.  We discussed carb counting  Shingles vaccination administered  No orders of the defined types were placed in this encounter.  No orders of the defined types were placed in this encounter.    [name], DO [place] [phone]

## 2021-08-31 NOTE — Patient Instructions (Signed)
We're going to recheck the xray to make sure that pneumonia cleared up but your lungs sounded good today.  You got a shingles shot today  We're rechecking your calcium and vitamin d levels.

## 2021-09-01 LAB — PTH, INTACT AND CALCIUM
Calcium: 9.4 mg/dL (ref 8.7–10.3)
PTH: 143 pg/mL — ABNORMAL HIGH (ref 15–65)

## 2021-09-01 LAB — VITAMIN D 25 HYDROXY (VIT D DEFICIENCY, FRACTURES): Vit D, 25-Hydroxy: 43.5 ng/mL (ref 30.0–100.0)

## 2021-09-01 NOTE — Addendum Note (Signed)
Addended byCarrolyn Leigh on: 09/01/2021 11:54 AM   Modules accepted: Orders

## 2021-09-02 ENCOUNTER — Other Ambulatory Visit: Payer: Self-pay

## 2021-09-02 DIAGNOSIS — E349 Endocrine disorder, unspecified: Secondary | ICD-10-CM

## 2021-09-23 ENCOUNTER — Other Ambulatory Visit: Payer: Medicare HMO

## 2021-09-23 DIAGNOSIS — E349 Endocrine disorder, unspecified: Secondary | ICD-10-CM | POA: Diagnosis not present

## 2021-09-24 LAB — PTH, INTACT AND CALCIUM
Calcium: 10.1 mg/dL (ref 8.7–10.3)
PTH: 73 pg/mL — ABNORMAL HIGH (ref 15–65)

## 2021-10-12 ENCOUNTER — Telehealth: Payer: Self-pay | Admitting: Family Medicine

## 2021-10-12 NOTE — Telephone Encounter (Signed)
Put her in at 12:15 tomorrow. Otherwise, ok to offer same day next week.

## 2021-10-12 NOTE — Telephone Encounter (Signed)
Lmtcb.

## 2021-10-12 NOTE — Telephone Encounter (Signed)
Pt r/c.

## 2021-10-13 ENCOUNTER — Encounter: Payer: Self-pay | Admitting: Family Medicine

## 2021-10-13 ENCOUNTER — Ambulatory Visit (INDEPENDENT_AMBULATORY_CARE_PROVIDER_SITE_OTHER): Payer: Medicare HMO | Admitting: Family Medicine

## 2021-10-13 VITALS — BP 128/72 | HR 83 | Temp 97.3°F | Ht 63.0 in | Wt 149.8 lb

## 2021-10-13 DIAGNOSIS — N644 Mastodynia: Secondary | ICD-10-CM

## 2021-10-13 NOTE — Progress Notes (Signed)
Subjective: CC: Breast concern PCP: [name], DO DZH:GDJME Angela Parrish is a 81 y.o. female presenting to clinic today for:  1.  Breast concerns Patient reports that on Monday she reached up while painting and had a sharp and quick pain in the right breast.  This same pain occurred again when she went to reach out on Tuesday.  On Wednesday she had a throbbing pain in the middle of her side that radiated to her back.  She has tried Tylenol, heating pad.  She talked to her sister yesterday who thought that she might be having acid reflux and recommended Tums.  Patient is she took several of these yesterday and it did cause the symptoms to resolve.  She has had no pain today.  Denies any nausea, vomiting.  No palpable lumps within the breast but she was very concerned that this may be a breast issue given her history of breast cancer on the left, which required resection and radiation treatment.   ROS: Per HPI  No Known Allergies Past Medical History:  Diagnosis Date   Abnormal Pap smear    Previous colposcopy   Acute pulmonary embolism ([place]) 09/21/2018   Arthritis    hip    Asthma    childhood     Basal cell carcinoma 07/08/2001   Right Forehead (curet, excision)   Breast cancer ([place]) 2000   Status post left lumpectomy   Closed fracture of third toe of right foot 03/13/2017   COPD (chronic obstructive pulmonary disease) (HCC)    Dyspnea    on exertion ; improved with use Spiriva    Headache    occ   Hyperlipidemia    Personal history of radiation therapy 2000   Right leg DVT ([place]) 09/22/2018    Current Outpatient Medications:    acetaminophen (TYLENOL) 325 MG tablet, Take 2 tablets (650 mg total) by mouth every 6 (six) hours as needed for mild pain (or Fever >/= 101)., Disp: , Rfl:    albuterol (PROVENTIL) (2.5 MG/3ML) 0.083% nebulizer solution, Take 3 mLs (2.5 mg total) by nebulization every 6 (six) hours as needed for wheezing or shortness of breath., Disp: 360 mL,  Rfl: 12   albuterol (VENTOLIN HFA) 108 (90 Base) MCG/ACT inhaler, INHALE 2 PUFFS INTO THE LUNGS EVERY 6 HOURS AS NEEDED FOR WHEEZING OR SHORTNESS OF BREATH., Disp: 18 each, Rfl: 0   atorvastatin (LIPITOR) 10 MG tablet, Take 1 tablet (10 mg total) by mouth daily., Disp: 90 tablet, Rfl: 3   Fluticasone-Umeclidin-Vilant (TRELEGY ELLIPTA) 200-62.5-25 MCG/INH AEPB, Inhale 1 puff into the lungs daily., Disp: 180 each, Rfl: 3  Current Facility-Administered Medications:    denosumab (PROLIA) injection 60 mg, 60 mg, Subcutaneous, Q6 months, [place]iginia Amendola M, DO, 60 mg at 05/31/21 0932 Social History   Socioeconomic History   Marital status: Significant Other    Spouse name: Landscape architect   Number of children: 1   Years of education: 12   Highest education level: Some college, no degree  Occupational History   Occupation: Retired    Comment: [employer]  Tobacco Use   Smoking status: Former    Types: Cigarettes    Quit date: 08/14/2007    Years since quitting: 14.1   Smokeless tobacco: Never  Vaping Use   Vaping Use: Never used  Substance and Sexual Activity   Alcohol use: No   Drug use: No   Sexual activity: Yes    Birth control/protection: Post-menopausal  Other Topics Concern  Not on file  Social History Narrative   Retired from [employer] age 65   She has 1 daughter who is 27 years old and lives in [place])   She has 2 grandsons age 13 and 35.   She has a significant other, Landscape architect.     No pets   She loves crafting and thrift stores   Social Determinants of Health   Financial Resource Strain: Low Risk    Difficulty of Paying Living Expenses: Not hard at all  Food Insecurity: No Food Insecurity   Worried About Charity fundraiser in the Last Year: Never true   Arboriculturist in the Last Year: Never true  Transportation Needs: No Transportation Needs   Lack of Transportation (Medical): No   Lack of Transportation (Non-Medical): No  Physical Activity: Inactive    Days of Exercise per Week: 0 days   Minutes of Exercise per Session: 0 min  Stress: No Stress Concern Present   Feeling of Stress : Not at all  Social Connections: Moderately Isolated   Frequency of Communication with Friends and Family: More than three times a week   Frequency of Social Gatherings with Friends and Family: Three times a week   Attends Religious Services: Never   Active Member of Clubs or Organizations: No   Attends Music therapist: Never   Marital Status: Living with partner  Intimate Partner Violence: Not At Risk   Fear of Current or Ex-Partner: No   Emotionally Abused: No   Physically Abused: No   Sexually Abused: No   Family History  Problem Relation Age of Onset   Heart disease Father    Lung cancer Father        smoker   Heart attack Father    Colon cancer Mother 63   Heart attack Mother    Heart disease Sister    Hypertension Brother    Heart attack Brother    Leukemia Brother    Obesity Daughter    Anxiety disorder Daughter    Hypertension Brother     Objective: Office vital signs reviewed. BP 128/72    Pulse 83    Temp (!) 97.3 F (36.3 C)    Ht 5\' 3"  (1.6 m)    Wt 149 lb 12.8 oz (67.9 kg)    SpO2 96%    BMI 26.54 kg/m   Physical Examination:  General: Awake, alert, No acute distress Breast: Postsurgical changes noted on the left breast.  Nipple is surgically absent.  No palpable abnormalities or masses.  Right breast without any dominant masses, abnormalities.  No skin changes.  No puckering of the breast.  No nipple changes or discharge.  She has some glandular tissue but nothing that is concerning on exam MSK: No tenderness palpation to the Thoracics or ribs.  No palpable abnormalities GI: Abdomen soft, nontender.  No palpable masses  Assessment/ Plan: 81 y.o. female   Breast pain - Plan: EKG 12-Lead  I think this is likely musculoskeletal versus GERD in nature as her symptoms have totally resolved now.  Her exam was  unremarkable for red flags.  We obtained EKG just to be sure that this was not an atypical presentation of ischemia.  That EKG showed no evidence of ischemia or abnormalities.  We discussed limiting use of Tums given CKD.  Would prefer that she use Pepcid if she is having acid reflux symptoms.  I do not think that she has anything going on  in the breast that would be concerning for cancer.  Additionally, her recent mammogram was reassuring.  We discussed red flag signs symptoms warranting further evaluation and she will follow-up if needed  Orders Placed This Encounter  Procedures   EKG 12-Lead   No orders of the defined types were placed in this encounter.    [name], DO [place] [phone]

## 2021-10-13 NOTE — Patient Instructions (Addendum)
EKG normal No abnormalities on your breast exam. Pepcid if acid starts again.  Watch the amount of extra calcium you take in the setting of kidney disease (the TUMS are PURE calcium)  Food Choices for Gastroesophageal Reflux Disease, Adult When you have gastroesophageal reflux disease (GERD), the foods you eat and your eating habits are very important. Choosing the right foods can help ease your discomfort. Think about working with a food expert (dietitian) to help you make good choices. What are tips for following this plan? Reading food labels Look for foods that are low in saturated fat. Foods that may help with your symptoms include: Foods that have less than 5% of daily value (DV) of fat. Foods that have 0 grams of trans fat. Cooking Do not fry your food. Cook your food by baking, steaming, grilling, or broiling. These are all methods that do not need a lot of fat for cooking. To add flavor, try to use herbs that are low in spice and acidity. Meal planning  Choose healthy foods that are low in fat, such as: Fruits and vegetables. Whole grains. Low-fat dairy products. Lean meats, fish, and poultry. Eat small meals often instead of eating 3 large meals each day. Eat your meals slowly in a place where you are relaxed. Avoid bending over or lying down until 2-3 hours after eating. Limit high-fat foods such as fatty meats or fried foods. Limit your intake of fatty foods, such as oils, butter, and shortening. Avoid the following as told by your doctor: Foods that cause symptoms. These may be different for different people. Keep a food diary to keep track of foods that cause symptoms. Alcohol. Drinking a lot of liquid with meals. Eating meals during the 2-3 hours before bed. Lifestyle Stay at a healthy weight. Ask your doctor what weight is healthy for you. If you need to lose weight, work with your doctor to do so safely. Exercise for at least 30 minutes on 5 or more days each week, or  as told by your doctor. Wear loose-fitting clothes. Do not smoke or use any products that contain nicotine or tobacco. If you need help quitting, ask your doctor. Sleep with the head of your bed higher than your feet. Use a wedge under the mattress or blocks under the bed frame to raise the head of the bed. Chew sugar-free gum after meals. What foods should eat? Eat a healthy, well-balanced diet of fruits, vegetables, whole grains, low-fat dairy products, lean meats, fish, and poultry. Each person is different. Foods that may cause symptoms in one person may not cause any symptoms in another person. Work with your doctor to find foods that are safe for you. The items listed above may not be a complete list of what you can eat and drink. Contact a food expert for more options. What foods should I avoid? Limiting some of these foods may help in managing the symptoms of GERD. Everyone is different. Talk with a food expert or your doctor to help you find the exact foods to avoid, if any. Fruits Any fruits prepared with added fat. Any fruits that cause symptoms. For some people, this may include citrus fruits, such as oranges, grapefruit, pineapple, and lemons. Vegetables Deep-fried vegetables. [place] fries. Any vegetables prepared with added fat. Any vegetables that cause symptoms. For some people, this may include tomatoes and tomato products, chili peppers, onions and garlic, and horseradish. Grains Pastries or quick breads with added fat. Meats and other proteins High-fat meats,  such as fatty beef or pork, hot dogs, ribs, ham, sausage, salami, and bacon. Fried meat or protein, including fried fish and fried chicken. Nuts and nut butters, in large amounts. Dairy Whole milk and chocolate milk. Sour cream. Cream. Ice cream. Cream cheese. Milkshakes. Fats and oils Butter. Margarine. Shortening. Ghee. Beverages Coffee and tea, with or without caffeine. Carbonated beverages. Sodas. Energy drinks.  Fruit juice made with acidic fruits, such as orange or grapefruit. Tomato juice. Alcoholic drinks. Sweets and desserts Chocolate and cocoa. Donuts. Seasonings and condiments Pepper. Peppermint and spearmint. Added salt. Any condiments, herbs, or seasonings that cause symptoms. For some people, this may include curry, hot sauce, or vinegar-based salad dressings. The items listed above may not be a complete list of what you should not eat and drink. Contact a food expert for more options. Questions to ask your doctor Diet and lifestyle changes are often the first steps that are taken to manage symptoms of GERD. If diet and lifestyle changes do not help, talk with your doctor about taking medicines. Where to find more information International Foundation for Gastrointestinal Disorders: aboutgerd.org Summary When you have GERD, food and lifestyle choices are very important in easing your symptoms. Eat small meals often instead of 3 large meals a day. Eat your meals slowly and in a place where you are relaxed. Avoid bending over or lying down until 2-3 hours after eating. Limit high-fat foods such as fatty meats or fried foods. This information is not intended to replace advice given to you by your health care provider. Make sure you discuss any questions you have with your health care provider. Document Revised: 02/16/2020 Document Reviewed: 02/16/2020 Elsevier Patient Education  [place].

## 2021-10-31 ENCOUNTER — Ambulatory Visit: Payer: Medicare HMO | Admitting: Family Medicine

## 2021-10-31 ENCOUNTER — Telehealth: Payer: Self-pay | Admitting: Family Medicine

## 2021-11-10 NOTE — Telephone Encounter (Signed)
Made a note on the patient's Prolia sheet. ?

## 2021-11-11 ENCOUNTER — Ambulatory Visit: Payer: Medicare HMO | Admitting: Family Medicine

## 2021-11-30 MED ORDER — DENOSUMAB 60 MG/ML ~~LOC~~ SOSY: 60.0000 mg | PREFILLED_SYRINGE | Freq: Once | SUBCUTANEOUS | 0 refills | Status: AC

## 2021-11-30 NOTE — Telephone Encounter (Signed)
Med ordered - she wants it shipped to [facility] - once here, we will sched the inj and call pt  ?

## 2021-12-02 ENCOUNTER — Telehealth: Payer: Self-pay | Admitting: Family Medicine

## 2021-12-02 NOTE — Telephone Encounter (Signed)
[facility] called to confirm shipment info for patients Prolia. ? ?Prolia should arrive at [facility] on 12/06/21 ?

## 2021-12-06 ENCOUNTER — Ambulatory Visit (INDEPENDENT_AMBULATORY_CARE_PROVIDER_SITE_OTHER): Payer: Medicare HMO | Admitting: Emergency Medicine

## 2021-12-06 ENCOUNTER — Telehealth: Payer: Self-pay | Admitting: Emergency Medicine

## 2021-12-06 DIAGNOSIS — M810 Age-related osteoporosis without current pathological fracture: Secondary | ICD-10-CM | POA: Diagnosis not present

## 2021-12-06 NOTE — Telephone Encounter (Signed)
TC to patient to schedule Prolia. Appt scheduled for 12/06/2021 ?

## 2021-12-06 NOTE — Progress Notes (Signed)
Prolia injection given in Right upper Arm. Patient tolerated well.  ?

## 2021-12-16 ENCOUNTER — Encounter (HOSPITAL_COMMUNITY): Payer: Self-pay | Admitting: *Deleted

## 2021-12-16 ENCOUNTER — Emergency Department (HOSPITAL_COMMUNITY): Payer: Medicare HMO

## 2021-12-16 ENCOUNTER — Observation Stay (HOSPITAL_COMMUNITY)
Admission: EM | Admit: 2021-12-16 | Discharge: 2021-12-17 | Disposition: A | Discharge status: Home / Self Care | Payer: Medicare HMO | Attending: Internal Medicine | Admitting: Internal Medicine

## 2021-12-16 ENCOUNTER — Other Ambulatory Visit: Payer: Self-pay

## 2021-12-16 DIAGNOSIS — Z79899 Other long term (current) drug therapy: Secondary | ICD-10-CM | POA: Diagnosis not present

## 2021-12-16 DIAGNOSIS — E785 Hyperlipidemia, unspecified: Secondary | ICD-10-CM | POA: Diagnosis present

## 2021-12-16 DIAGNOSIS — I10 Essential (primary) hypertension: Secondary | ICD-10-CM | POA: Diagnosis not present

## 2021-12-16 DIAGNOSIS — G459 Transient cerebral ischemic attack, unspecified: Principal | ICD-10-CM

## 2021-12-16 DIAGNOSIS — J449 Chronic obstructive pulmonary disease, unspecified: Secondary | ICD-10-CM | POA: Diagnosis not present

## 2021-12-16 DIAGNOSIS — Z87891 Personal history of nicotine dependence: Secondary | ICD-10-CM | POA: Insufficient documentation

## 2021-12-16 DIAGNOSIS — Z853 Personal history of malignant neoplasm of breast: Secondary | ICD-10-CM | POA: Insufficient documentation

## 2021-12-16 DIAGNOSIS — I129 Hypertensive chronic kidney disease with stage 1 through stage 4 chronic kidney disease, or unspecified chronic kidney disease: Secondary | ICD-10-CM | POA: Insufficient documentation

## 2021-12-16 DIAGNOSIS — Z96641 Presence of right artificial hip joint: Secondary | ICD-10-CM | POA: Insufficient documentation

## 2021-12-16 DIAGNOSIS — R29818 Other symptoms and signs involving the nervous system: Secondary | ICD-10-CM | POA: Diagnosis not present

## 2021-12-16 DIAGNOSIS — E7841 Elevated Lipoprotein(a): Secondary | ICD-10-CM | POA: Diagnosis not present

## 2021-12-16 DIAGNOSIS — J45909 Unspecified asthma, uncomplicated: Secondary | ICD-10-CM | POA: Diagnosis not present

## 2021-12-16 DIAGNOSIS — H5462 Unqualified visual loss, left eye, normal vision right eye: Secondary | ICD-10-CM | POA: Diagnosis not present

## 2021-12-16 DIAGNOSIS — N184 Chronic kidney disease, stage 4 (severe): Secondary | ICD-10-CM

## 2021-12-16 DIAGNOSIS — Z85828 Personal history of other malignant neoplasm of skin: Secondary | ICD-10-CM | POA: Diagnosis not present

## 2021-12-16 DIAGNOSIS — H538 Other visual disturbances: Secondary | ICD-10-CM | POA: Diagnosis present

## 2021-12-16 DIAGNOSIS — M852 Hyperostosis of skull: Secondary | ICD-10-CM | POA: Diagnosis not present

## 2021-12-16 DIAGNOSIS — I6782 Cerebral ischemia: Secondary | ICD-10-CM | POA: Diagnosis not present

## 2021-12-16 HISTORY — DX: Transient cerebral ischemic attack, unspecified: G45.9

## 2021-12-16 LAB — DIFFERENTIAL
Abs Immature Granulocytes: 0.03 10*3/uL (ref 0.00–0.07)
Basophils Absolute: 0.1 10*3/uL (ref 0.0–0.1)
Basophils Relative: 1 %
Eosinophils Absolute: 0.3 10*3/uL (ref 0.0–0.5)
Eosinophils Relative: 4 %
Immature Granulocytes: 0 %
Lymphocytes Relative: 24 %
Lymphs Abs: 1.9 10*3/uL (ref 0.7–4.0)
Monocytes Absolute: 0.9 10*3/uL (ref 0.1–1.0)
Monocytes Relative: 11 %
Neutro Abs: 4.7 10*3/uL (ref 1.7–7.7)
Neutrophils Relative %: 60 %

## 2021-12-16 LAB — COMPREHENSIVE METABOLIC PANEL
ALT: 21 U/L (ref 0–44)
AST: 26 U/L (ref 15–41)
Albumin: 4.1 g/dL (ref 3.5–5.0)
Alkaline Phosphatase: 73 U/L (ref 38–126)
Anion gap: 7 (ref 5–15)
BUN: 33 mg/dL — ABNORMAL HIGH (ref 8–23)
CO2: 23 mmol/L (ref 22–32)
Calcium: 8.7 mg/dL — ABNORMAL LOW (ref 8.9–10.3)
Chloride: 109 mmol/L (ref 98–111)
Creatinine, Ser: 1.78 mg/dL — ABNORMAL HIGH (ref 0.44–1.00)
GFR, Estimated: 28 mL/min — ABNORMAL LOW (ref >60)
Glucose, Bld: 98 mg/dL (ref 70–99)
Potassium: 5.1 mmol/L (ref 3.5–5.1)
Sodium: 139 mmol/L (ref 135–145)
Total Bilirubin: 0.5 mg/dL (ref 0.3–1.2)
Total Protein: 7.8 g/dL (ref 6.5–8.1)

## 2021-12-16 LAB — CBC
HCT: 40.2 % (ref 36.0–46.0)
Hemoglobin: 12.6 g/dL (ref 12.0–15.0)
MCH: 30.7 pg (ref 26.0–34.0)
MCHC: 31.3 g/dL (ref 30.0–36.0)
MCV: 98 fL (ref 80.0–100.0)
Platelets: 193 10*3/uL (ref 150–400)
RBC: 4.1 MIL/uL (ref 3.87–5.11)
RDW: 12.7 % (ref 11.5–15.5)
WBC: 7.8 10*3/uL (ref 4.0–10.5)
nRBC: 0 % (ref 0.0–0.2)

## 2021-12-16 LAB — APTT: aPTT: 21 seconds — ABNORMAL LOW (ref 24–36)

## 2021-12-16 LAB — PROTIME-INR
INR: 1 (ref 0.8–1.2)
Prothrombin Time: 12.9 seconds (ref 11.4–15.2)

## 2021-12-16 MED ORDER — HEPARIN SODIUM (PORCINE) 5000 UNIT/ML IJ SOLN
5000.0000 [IU] | Freq: Three times a day (TID) | INTRAMUSCULAR | Status: DC
  Administered 2021-12-16 – 2021-12-17 (×2): 5000 [IU] via SUBCUTANEOUS
  Filled 2021-12-16 (×3): qty 1

## 2021-12-16 MED ORDER — ACETAMINOPHEN 160 MG/5ML PO SOLN: 650.0000 mg | ORAL | Status: DC | PRN

## 2021-12-16 MED ORDER — FLUTICASONE FUROATE-VILANTEROL 200-25 MCG/ACT IN AEPB
1.0000 | INHALATION_SPRAY | Freq: Every day | RESPIRATORY_TRACT | Status: DC
  Administered 2021-12-17: 1 via RESPIRATORY_TRACT
  Filled 2021-12-16: qty 28

## 2021-12-16 MED ORDER — ACETAMINOPHEN 325 MG PO TABS: 650.0000 mg | ORAL_TABLET | ORAL | Status: DC | PRN

## 2021-12-16 MED ORDER — STROKE: EARLY STAGES OF RECOVERY BOOK
Freq: Once | Status: AC
  Administered 2021-12-16: 1

## 2021-12-16 MED ORDER — UMECLIDINIUM BROMIDE 62.5 MCG/ACT IN AEPB
1.0000 | INHALATION_SPRAY | Freq: Every day | RESPIRATORY_TRACT | Status: DC
  Administered 2021-12-17: 1 via RESPIRATORY_TRACT
  Filled 2021-12-16: qty 7

## 2021-12-16 MED ORDER — ACETAMINOPHEN 650 MG RE SUPP: 650.0000 mg | RECTAL | Status: DC | PRN

## 2021-12-16 MED ORDER — ALBUTEROL SULFATE (2.5 MG/3ML) 0.083% IN NEBU: 2.5000 mg | INHALATION_SOLUTION | Freq: Four times a day (QID) | RESPIRATORY_TRACT | Status: DC | PRN

## 2021-12-16 MED ORDER — ASPIRIN EC 81 MG PO TBEC
81.0000 mg | DELAYED_RELEASE_TABLET | Freq: Every day | ORAL | Status: DC
  Administered 2021-12-16 – 2021-12-17 (×2): 81 mg via ORAL
  Filled 2021-12-16 (×2): qty 1

## 2021-12-16 MED ORDER — SODIUM CHLORIDE 0.9 % IV SOLN: INTRAVENOUS | Status: DC

## 2021-12-16 MED ORDER — SENNOSIDES-DOCUSATE SODIUM 8.6-50 MG PO TABS: 1.0000 | ORAL_TABLET | Freq: Every evening | ORAL | Status: DC | PRN

## 2021-12-16 MED ORDER — ATORVASTATIN CALCIUM 10 MG PO TABS
10.0000 mg | ORAL_TABLET | Freq: Every day | ORAL | Status: DC
  Administered 2021-12-17: 10 mg via ORAL
  Filled 2021-12-16: qty 1

## 2021-12-16 NOTE — ED Notes (Signed)
Pt in MRI.

## 2021-12-16 NOTE — ED Notes (Signed)
Patient transported to MRI 

## 2021-12-16 NOTE — ED Notes (Signed)
Pt tolerating PO Fluid and food w/o complication at this time.  ?

## 2021-12-16 NOTE — Progress Notes (Signed)
Neurology telephone recommendations ? ?Contacted by Dr. Noemi Chapel in [place] ED about this 81 yo woman with no prior hx stroke p/w intermittent painless loss of vision in L eye. 2 days ago lost vision completely for hours, then vision normalized, later developed amaurosis fugax with bottom half of vision in that eye blacked out, also resolved. No current deficits. Head CT NAICP. She saw optometrist who recommended she come to ED for stroke eval and also set her up with retina specialist for Monday. ? ?Recommendations: ?- Admit obs to hospitalist service at [facility] for stroke workup ?- Permissive HTN x48 hrs from sx onset or until stroke ruled out by MRI goal BP <220/110. PRN labetalol or hydralazine if BP above these parameters. Avoid oral antihypertensives. ?- MRI brain wo contrast ?- CTA or MRA H&N ?- TTE w/ bubble ?- Check A1c and LDL + add statin per guidelines ?- ASA '81mg'$  daily (If MRI brain (+) for acute infarct, please then start ASA '81mg'$  daily + plavix '75mg'$  daily x21 days f/b ASA '81mg'$  daily monotherapy after that) ?- q4 hr neuro checks ?- STAT head CT for any change in neuro exam ?- Tele ?- PT/OT/SLP ?- Stroke education ?- Amb referral to neurology upon discharge  ?- F/u with retinal specialist Mon ? ?Neurology will be available prn for questions ? ?Su Monks, MD ?[facility] ?[phone] ? ?If 7pm- 7am, please page neurology on call as listed in [place]. ? ?

## 2021-12-16 NOTE — ED Provider Notes (Signed)
?[facility] ?Provider Note ? ? ?CSN: [id number] ?Arrival date & time: 12/16/21  1432 ? ?  ? ?History ? ?No chief complaint on file. ? ? ?Angela Parrish is a 81 y.o. female. ? ?HPI ? ?This patient is a 81 year old female, she is not anticoagulated, has no prior history of stroke, she presents to the hospital with a complaint of intermittent painless loss of vision in her left eye.  This initially started 2 days ago, when she woke up in the morning her entire left eye was without any vision whatsoever, no detection of light.  This spontaneously improved and got back to normal and later in the day she had only the bottom half of her vision which was completely blacked out.  This also completely resolved by the end of the night, yesterday was totally normal and she saw her optometrist, they set her up with a retina specialist for Monday in [place] but recommended she come to the ER for stroke evaluation.  Of note the patient does note that she had some sparkling lights in her left eye which have resolved as well.  She was told by her optometrist that her vision was completely normal yesterday ? ?She has not been complaining of any nausea vomiting diarrhea shortness of breath chest pain headache difficulty with speech coordination or strength, her gait is normal, she has no abdominal pain, she has no history of palpitations arrhythmias or atrial fibrillation. ? ?Home Medications ?Prior to Admission medications   ?Medication Sig Start Date End Date Taking? Authorizing Provider  ?acetaminophen (TYLENOL) 325 MG tablet Take 2 tablets (650 mg total) by mouth every 6 (six) hours as needed for mild pain (or Fever >/= 101). 09/22/18   Johnson, Clanford L, MD  ?albuterol (PROVENTIL) (2.5 MG/3ML) 0.083% nebulizer solution Take 3 mLs (2.5 mg total) by nebulization every 6 (six) hours as needed for wheezing or shortness of breath. 03/23/20   [name], DO  ?albuterol (VENTOLIN HFA) 108 (90 Base) MCG/ACT  inhaler INHALE 2 PUFFS INTO THE LUNGS EVERY 6 HOURS AS NEEDED FOR WHEEZING OR SHORTNESS OF BREATH. 05/11/21   Ronnie Doss M, DO  ?atorvastatin (LIPITOR) 10 MG tablet Take 1 tablet (10 mg total) by mouth daily. 02/28/21   [name], DO  ?Fluticasone-Umeclidin-Vilant (TRELEGY ELLIPTA) 200-62.5-25 MCG/INH AEPB Inhale 1 puff into the lungs daily. 03/15/21   [name], DO  ?   ? ?Allergies    ?Patient has no known allergies.   ? ?Review of Systems   ?Review of Systems  ?All other systems reviewed and are negative. ? ?Physical Exam ?Updated Vital Signs ?BP (!) 174/84   Pulse 68   Temp 98.2 ?F (36.8 ?C) (Oral)   Resp 20   Ht 1.6 m ('5\' 3"'$ )   Wt 67.6 kg   SpO2 100%   BMI 26.39 kg/m?  ?Physical Exam ?Vitals and nursing note reviewed.  ?Constitutional:   ?   General: She is not in acute distress. ?   Appearance: She is well-developed.  ?HENT:  ?   Head: Normocephalic and atraumatic.  ?   Mouth/Throat:  ?   Pharynx: No oropharyngeal exudate.  ?Eyes:  ?   General: No scleral icterus.    ?   Right eye: No discharge.     ?   Left eye: No discharge.  ?   Conjunctiva/sclera: Conjunctivae normal.  ?   Pupils: Pupils are equal, round, and reactive to light.  ?Neck:  ?  Thyroid: No thyromegaly.  ?   Vascular: No JVD.  ?Cardiovascular:  ?   Rate and Rhythm: Normal rate and regular rhythm.  ?   Heart sounds: Normal heart sounds. No murmur heard. ?  No friction rub. No gallop.  ?Pulmonary:  ?   Effort: Pulmonary effort is normal. No respiratory distress.  ?   Breath sounds: Normal breath sounds. No wheezing or rales.  ?Abdominal:  ?   General: Bowel sounds are normal. There is no distension.  ?   Palpations: Abdomen is soft. There is no mass.  ?   Tenderness: There is no abdominal tenderness.  ?Musculoskeletal:     ?   General: No tenderness. Normal range of motion.  ?   Cervical back: Normal range of motion and neck supple.  ?Lymphadenopathy:  ?   Cervical: No cervical adenopathy.  ?Skin: ?   General: Skin  is warm and dry.  ?   Findings: No erythema or rash.  ?Neurological:  ?   Mental Status: She is alert.  ?   Coordination: Coordination normal.  ?   Comments: Speech is clear, cranial nerves III through XII are intact, memory is intact, strength is normal in all 4 extremities including grips, sensation is intact to light touch and pinprick in all 4 extremities. Coordination as tested by finger-nose-finger is normal, no limb ataxia. Normal gait, normal reflexes at the patellar tendons bilaterally  ?Psychiatric:     ?   Behavior: Behavior normal.  ? ? ?ED Results / Procedures / Treatments   ?Labs ?(all labs ordered are listed, but only abnormal results are displayed) ?Labs Reviewed  ?APTT - Abnormal; Notable for the following components:  ?    Result Value  ? aPTT 21 (*)   ? All other components within normal limits  ?COMPREHENSIVE METABOLIC PANEL - Abnormal; Notable for the following components:  ? BUN 33 (*)   ? Creatinine, Ser 1.78 (*)   ? Calcium 8.7 (*)   ? GFR, Estimated 28 (*)   ? All other components within normal limits  ?PROTIME-INR  ?CBC  ?DIFFERENTIAL  ? ? ?EKG ?EKG Interpretation ? ?Date/Time:  Friday December 16 2021 17:43:06 EDT ?Ventricular Rate:  69 ?PR Interval:  171 ?QRS Duration: 82 ?QT Interval:  406 ?QTC Calculation: 435 ?R Axis:   -21 ?Text Interpretation: Sinus rhythm Borderline left axis deviation Confirmed by Noemi Chapel [phone]) on 12/16/2021 5:57:02 PM ? ?Radiology ?CT HEAD WO CONTRAST ? ?Result Date: 12/16/2021 ?CLINICAL DATA:  Neurological deficit, intermittent loss of vision left eye EXAM: CT HEAD WITHOUT CONTRAST TECHNIQUE: Contiguous axial images were obtained from the base of the skull through the vertex without intravenous contrast. RADIATION DOSE REDUCTION: This exam was performed according to the departmental dose-optimization program which includes automated exposure control, adjustment of the mA and/or kV according to patient size and/or use of iterative reconstruction technique.  COMPARISON:  None. FINDINGS: Brain: No acute intracranial findings are seen in the noncontrast CT brain. There are no signs of bleeding. Ventricles are not dilated. Cortical sulci are prominent. Vascular: Unremarkable. 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 Sinuses/Orbits: Paranasal sinuses are unremarkable. There is linear increased density in the region of lens in the anterior right optic globe. This may be residual from previous intervention. Other: None IMPRESSION: No acute intracranial findings are seen in noncontrast CT brain. Atrophy. Electronically Signed   By: Elmer Picker M.D.   On: 12/16/2021 18:31   ? ?Procedures ?Procedures  ? ? ?Medications Ordered in  ED ?Medications  ?aspirin EC tablet 81 mg (has no administration in time range)  ? ? ?ED Course/ Medical Decision Making/ A&P ?  ?                        ?Medical Decision Making ?Amount and/or Complexity of Data Reviewed ?Labs: ordered. ?Radiology: ordered. ? ?Risk ?OTC drugs. ?Decision regarding hospitalization. ? ? ?This patient presents to the ED for concern of visual loss, painless, this involves an extensive number of treatment options, and is a complaint that carries with it a high risk of complications and morbidity.  The differential diagnosis includes posterior vitreous hemorrhage, retinal detachment, central retinal artery or vein occlusion, acute stroke, TIA ? ? ?Co morbidities that complicate the patient evaluation ? ?Hypercholesterolemia, age, hypertension measured at 163/71 ? ? ?Additional history obtained: ? ?Additional history obtained from family member at the bedside ?External records from outside source obtained and reviewed including prior imaging studies, she has not had any recent neuroimaging, going back at least 10 years. ? ? ?Lab Tests: ? ?I Ordered, and personally interpreted labs.  The pertinent results include: Normal INR, CBC is normal, metabolic panel is unremarkable as well.  She does have a creatinine of  1.78 but her baseline is 2.0 compared to prior labs. ? ? ?Imaging Studies ordered: ? ?I ordered imaging studies including CT scan of the head ?I independently visualized and interpreted imaging which showed no acute

## 2021-12-16 NOTE — H&P (Addendum)
? ?                                                                                                       [facility] H&P ? ? Patient Demographics:  ? ? Angela Parrish, is a 81 y.o. female  MRN: [id number]   DOB - 12-Nov-1940 ? ?Admit Date - 12/16/2021 ? ?Outpatient Primary MD for the patient is [name], DO ? ?Referring MD/NP/PA: Dr Sabra Heck ? ?Patient coming from: home ? ?No chief complaint on file. ?  ? ? HPI:  ? ? Angela Parrish  is a 81 y.o. female, with past medical history of hypertension, breast cancer, PE in the past status post hip replacement, not anymore on anticoagulation. ?-Patient presents with painless loss of vision in her left eye, reports symptoms started 2 days ago, woke up in the morning with entire vision loss in the left eye, no detection of light, this spontaneously improved and got back to normal vision later in the day, had only the bottom half of her vision which was completely blacked out,, this completely resolved by the end of the night, she saw her optometrist yesterday, who referred her to retina specialist for Monday in [place], but recommended she come to ED for stroke evaluation, patient denies any chest pain, shortness of breath, fever, chills, any focal deficits.. ?-In ED CT head, MRI brain with no acute findings, creatinine at 1.7 around her baseline, no other lab abnormalities, [facility] hospitalist consulted to admit. ? ? ? Review of systems:  ?  ? ?A full 10 point Review of Systems was done, except as stated above, all other Review of Systems were negative. ? ? ?With Past History of the following :  ? ? ?Past Medical History:  ?Diagnosis Date  ? Abnormal Pap smear   ? Previous colposcopy  ? Acute pulmonary embolism ([place]) 09/21/2018  ? 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 Asthma   ? childhood    ? Basal cell carcinoma 07/08/2001  ? Right Forehead (curet, excision)  ? Breast cancer ([place]) 2000  ? Status post left lumpectomy  ? Closed fracture of third toe of right foot 03/13/2017  ? COPD (chronic  obstructive pulmonary disease) ([place])   ? Dyspnea   ? on exertion ; improved with use Spiriva   ? Headache   ? occ  ? Hyperlipidemia   ? Personal history of radiation therapy 2000  ? Right leg DVT ([place]) 09/22/2018  ?   ? ?Past Surgical History:  ?Procedure Laterality Date  ? BREAST LUMPECTOMY Left 2000  ? CATARACT EXTRACTION, BILATERAL    ? COLONOSCOPY N/A 09/16/2014  ? Procedure: COLONOSCOPY;  Surgeon: Rogene Houston, MD;  Location: [facility];  Service: Endoscopy;  Laterality: N/A;  830 - moved to 9:15 - Ann to notify  ? JOINT REPLACEMENT  06/2018  ? right hip  ? KNEE CARTILAGE SURGERY Right   ? TOTAL HIP ARTHROPLASTY Right 07/17/2018  ? Procedure: RIGHT TOTAL HIP ARTHROPLASTY ANTERIOR APPROACH;  Surgeon: Gaynelle Arabian, MD;  Location: [facility];  Service:  Orthopedics;  Laterality: Right;  170mn  ? ? ? ? Social History:  ? ?  ?Social History  ? ?Tobacco Use  ? Smoking status: Former  ?  Types: Cigarettes  ?  Quit date: 08/14/2007  ?  Years since quitting: 14.3  ? Smokeless tobacco: Never  ?Substance Use Topics  ? Alcohol use: No  ?  ? ? ? ? Family History :  ? ?  ?Family History  ?Problem Relation Age of Onset  ? Heart disease Father   ? Lung cancer Father   ?     smoker  ? Heart attack Father   ? Colon cancer Mother [phone] ? Heart attack Mother   ? Heart disease Sister   ? Hypertension Brother   ? Heart attack Brother   ? Leukemia Brother   ? Obesity Daughter   ? Anxiety disorder Daughter   ? Hypertension Brother   ? ? ? ? Home Medications:  ? ?Prior to Admission medications   ?Medication Sig Start Date End Date Taking? Authorizing Provider  ?acetaminophen (TYLENOL) 325 MG tablet Take 2 tablets (650 mg total) by mouth every 6 (six) hours as needed for mild pain (or Fever >/= 101). 09/22/18  Yes Johnson, Clanford L, MD  ?albuterol (PROVENTIL) (2.5 MG/3ML) 0.083% nebulizer solution Take 3 mLs (2.5 mg total) by nebulization every 6 (six) hours as needed for wheezing or shortness of breath. 03/23/20  Yes Gottschalk, ALeatrice JewelsM,  DO  ?albuterol (VENTOLIN HFA) 108 (90 Base) MCG/ACT inhaler INHALE 2 PUFFS INTO THE LUNGS EVERY 6 HOURS AS NEEDED FOR WHEEZING OR SHORTNESS OF BREATH. 05/11/21  Yes GRonnie DossM, DO  ?atorvastatin (LIPITOR) 10 MG tablet Take 1 tablet (10 mg total) by mouth daily. 02/28/21  Yes GJanora Norlander DO  ?Fluticasone-Umeclidin-Vilant (TRELEGY ELLIPTA) 200-62.5-25 MCG/INH AEPB Inhale 1 puff into the lungs daily. 03/15/21  Yes GRonnie DossM, DO  ?loratadine (CLARITIN) 10 MG tablet Take 10 mg by mouth daily.   Yes [provider]  ? ? ? Allergies:  ? ? No Known Allergies ? ? Physical Exam:  ? ?Vitals ? ?Blood pressure (!) 159/74, pulse 80, temperature 98.2 ?F (36.8 ?C), temperature source Oral, resp. rate 16, height '5\' 3"'$  (1.6 m), weight 67.6 kg, SpO2 100 %. ? ? ?1. General  lying in bed in NAD,  ? ?2. Normal affect and insight, Not Suicidal or Homicidal, Awake Alert, Oriented X 3. ? ?3. No F.N deficits, ALL C.Nerves Intact, Strength 5/5 all 4 extremities, Sensation intact all 4 extremities, Plantars down going. ? ?4. Ears and Eyes appear Normal, Conjunctivae clear, PERRLA. Moist Oral Mucosa. ? ?5. Supple Neck, No JVD, No cervical lymphadenopathy appriciated, No Carotid Bruits. ? ?6. Symmetrical Chest wall movement, Good air movement bilaterally, CTAB. ? ?7. RRR, No Gallops, Rubs or Murmurs, No Parasternal Heave. ? ?8. Positive Bowel Sounds, Abdomen Soft, No tenderness, No organomegaly appriciated,No rebound -guarding or rigidity. ? ?9.  No Cyanosis, Normal Skin Turgor, No Skin Rash or Bruise. ? ?10. Good muscle tone,  joints appear normal , no effusions, Normal ROM. ? ?11. No Palpable Lymph Nodes in Neck or Axillae ? ? ? ? Data Review:  ? ? CBC ?Recent Labs  ?Lab 12/16/21 ?1711  ?WBC 7.8  ?HGB 12.6  ?HCT 40.2  ?PLT 193  ?MCV 98.0  ?MCH 30.7  ?MCHC 31.3  ?RDW 12.7  ?LYMPHSABS 1.9  ?MONOABS 0.9  ?EOSABS 0.3  ?BASOSABS 0.1   ? ?------------------------------------------------------------------------------------------------------------------ ? ?Chemistries  ?Recent  Labs  ?Lab 12/16/21 ?1711  ?NA 139  ?K 5.1  ?CL 109  ?CO2 23  ?GLUCOSE 98  ?BUN 33*  ?CREATININE 1.78*  ?CALCIUM 8.7*  ?AST 26  ?ALT 21  ?ALKPHOS 73  ?BILITOT 0.5  ? ?------------------------------------------------------------------------------------------------------------------ ?estimated creatinine clearance is 22.9 mL/min (A) (by C-G formula based on SCr of 1.78 mg/dL (H)). ?------------------------------------------------------------------------------------------------------------------ ?No results for input(s): TSH, T4TOTAL, T3FREE, THYROIDAB in the last 72 hours. ? ?Invalid input(s): FREET3 ? ?Coagulation profile ?Recent Labs  ?Lab 12/16/21 ?1711  ?INR 1.0  ? ?------------------------------------------------------------------------------------------------------------------- ?No results for input(s): DDIMER in the last 72 hours. ?------------------------------------------------------------------------------------------------------------------- ? ?Cardiac Enzymes ?No results for input(s): CKMB, TROPONINI, MYOGLOBIN in the last 168 hours. ? ?Invalid input(s): CK ?------------------------------------------------------------------------------------------------------------------ ?No results found for: BNP ? ? ?--------------------------------------------------------------------------------------------------------------- ? ?Urinalysis ?No results found for: COLORURINE, APPEARANCEUR, [place], [place], [place], [place], [place], [place], PROTEINUR, UROBILINOGEN, NITRITE, LEUKOCYTESUR ? ?---------------------------------------------------------------------------------------------------------------- ? ? Imaging Results:  ? ? CT HEAD WO CONTRAST ? ?Result Date: 12/16/2021 ?CLINICAL DATA:  Neurological deficit, intermittent loss of vision left eye EXAM: CT HEAD WITHOUT  CONTRAST TECHNIQUE: Contiguous axial images were obtained from the base of the skull through the vertex without intravenous contrast. RADIATION DOSE REDUCTION: This exam was performed according to the departmental dose-optimization program which includes automated exposure control, adjustment of

## 2021-12-16 NOTE — ED Triage Notes (Signed)
Pt with vision loss to left eye on Wednesday morning at 0630 and around 0930/1000 vision resolved on own.  Wednesday afternoon half vision gone in same eye. Pt seen her eye doctor yesterday morning and sent her here to r/o stroke or "to see if it would cause a stroke".  Vision is clear to left eye currently. Eye doctor to [facility] pt to [facility].  ?

## 2021-12-17 ENCOUNTER — Observation Stay (HOSPITAL_BASED_OUTPATIENT_CLINIC_OR_DEPARTMENT_OTHER): Payer: Medicare HMO

## 2021-12-17 ENCOUNTER — Observation Stay (HOSPITAL_COMMUNITY): Payer: Medicare HMO

## 2021-12-17 DIAGNOSIS — N184 Chronic kidney disease, stage 4 (severe): Secondary | ICD-10-CM | POA: Diagnosis not present

## 2021-12-17 DIAGNOSIS — I10 Essential (primary) hypertension: Secondary | ICD-10-CM | POA: Diagnosis not present

## 2021-12-17 DIAGNOSIS — G459 Transient cerebral ischemic attack, unspecified: Secondary | ICD-10-CM

## 2021-12-17 DIAGNOSIS — I6523 Occlusion and stenosis of bilateral carotid arteries: Secondary | ICD-10-CM | POA: Diagnosis not present

## 2021-12-17 DIAGNOSIS — I771 Stricture of artery: Secondary | ICD-10-CM | POA: Diagnosis not present

## 2021-12-17 DIAGNOSIS — E7841 Elevated Lipoprotein(a): Secondary | ICD-10-CM | POA: Diagnosis not present

## 2021-12-17 LAB — HEMOGLOBIN A1C
Hgb A1c MFr Bld: 5.7 % — ABNORMAL HIGH (ref 4.8–5.6)
Mean Plasma Glucose: 116.89 mg/dL

## 2021-12-17 LAB — ECHOCARDIOGRAM COMPLETE BUBBLE STUDY
AR max vel: 2.79 cm2
AV Area VTI: 2.65 cm2
AV Area mean vel: 2.62 cm2
AV Mean grad: 5 mmHg
AV Peak grad: 10.2 mmHg
Ao pk vel: 1.6 m/s
Area-P 1/2: 3.17 cm2
Calc EF: 53.1 %
MV VTI: 2.78 cm2
S' Lateral: 2.1 cm
Single Plane A2C EF: 47.1 %
Single Plane A4C EF: 60.9 %

## 2021-12-17 LAB — LIPID PANEL
Cholesterol: 162 mg/dL (ref 0–200)
HDL: 59 mg/dL (ref >40)
LDL Cholesterol: 82 mg/dL (ref 0–99)
Total CHOL/HDL Ratio: 2.7 RATIO
Triglycerides: 106 mg/dL (ref <150)
VLDL: 21 mg/dL (ref 0–40)

## 2021-12-17 MED ORDER — ASPIRIN 81 MG PO TBEC
81.0000 mg | DELAYED_RELEASE_TABLET | Freq: Every day | ORAL | 11 refills | Status: AC
Start: 2021-12-18 — End: ?

## 2021-12-17 NOTE — Plan of Care (Signed)
?  Problem: Acute Rehab PT Goals(only PT should resolve) ?Goal: Pt Will Go Supine/Side To Sit ?Outcome: Progressing ?Flowsheets (Taken 12/17/2021 3533) ?Pt will go Supine/Side to Sit: Independently ?Goal: Patient Will Transfer Sit To/From Stand ?Outcome: Progressing ?Flowsheets (Taken 12/17/2021 1740) ?Patient will transfer sit to/from stand: Independently ?Goal: Pt Will Transfer Bed To Chair/Chair To Bed ?Outcome: Progressing ?Flowsheets (Taken 12/17/2021 9927) ?Pt will Transfer Bed to Chair/Chair to Bed: Independently ?Goal: Pt Will Ambulate ?Outcome: Progressing ?Flowsheets (Taken 12/17/2021 8004) ?Pt will Ambulate: ? Independently ? > 125 feet ?  ?

## 2021-12-17 NOTE — Progress Notes (Signed)
*  PRELIMINARY RESULTS* ?Echocardiogram ?2D Echocardiogram has been performed. ? ?Angela Parrish ?12/17/2021, 9:42 AM ?

## 2021-12-17 NOTE — Discharge Summary (Signed)
?Physician Discharge Summary ?  ?Patient: Angela Parrish MRN: [id number] DOB: 13-Aug-1941  ?Admit date:     12/16/2021  ?Discharge date: 12/17/21  ?Discharge Physician: Barton Dubois  ? ?PCP: [name], DO  ? ?Recommendations at discharge:  ?Reassess blood pressure and adjust antihypertensive regimen as needed ?Repeat basic metabolic panel to follow electrolytes and renal function ?Make sure patient has follow-up with ophthalmologist and neurology as instructed. ?Make sure patient symptoms has no longer return and that she is compliant with her medications. ? ?Discharge Diagnoses: ?Principal Problem: ?  TIA (transient ischemic attack) ?Active Problems: ?  Essential hypertension ?  Hyperlipidemia ?  Chronic kidney disease, stage 4 (severe) (HCC) ? ? ? ?Hospital Course: ?As per H&P written by Dr. Waldron Labs on 12/16/2021 ? Angela Parrish  is a 81 y.o. female, with past medical history of hypertension, breast cancer, PE in the past status post hip replacement, not anymore on anticoagulation. ?-Patient presents with painless loss of vision in her left eye, reports symptoms started 2 days ago, woke up in the morning with entire vision loss in the left eye, no detection of light, this spontaneously improved and got back to normal vision later in the day, had only the bottom half of her vision which was completely blacked out,, this completely resolved by the end of the night, she saw her optometrist yesterday, who referred her to retina specialist for Monday in [place], but recommended she come to ED for stroke evaluation, patient denies any chest pain, shortness of breath, fever, chills, any focal deficits.. ?-In ED CT head, MRI brain with no acute findings, creatinine at 1.7 around her baseline, no other lab abnormalities, [facility] hospitalist consulted to admit. ? ?Assessment and Plan: ?1-TIA ?-Patient with painless transient left eye vision loss; symptoms completely resolved and so far no recurrence. ?-CT scan and MRI  not demonstrating acute infarcts. ?-2D echo with bubble study reassuring and not demonstrating any significant valvular disorder or thrombi.  Preserved ejection fraction. ?-LDL 82, A1c 5.7 ?-Case discussed with neurology who recommended continue risk factor modifications, starting baby aspirin on daily basis for secondary prevention and continue the use of a statins. ?-Patient will follow-up with neurology as an outpatient in the next 4 weeks. ?-Patient will follow-up with retina specialist ophthalmologist as an outpatient on 12/19/2021. ? ?2-essential hypertension ?-Permissive hypertension allow for 48 hours with resumption of home antihypertensive agents. ?-Heart healthy diet discussed with patient. ? ?3-hyperlipidemia ?-Continue statins. ? ?4-chronic kidney disease stage IV ?-Appears to be stable and at baseline ?-Continue to avoid nephrotoxic agents and the use of contrast ?-Maintain adequate hydration ?-Repeat basic metabolic panel at follow-up visit to assess stability. ? ?5-history of COPD ?-No acute exacerbation ?-Continue home bronchodilator management. ?-Good oxygen saturation on room air. ? ? ?Consultants: Neurology service ?Procedures performed: See below for x-ray reports. ?2D echo ?Disposition: Home ?Diet recommendation:  ?Discharge Diet Orders (From admission, onward)  ? ?  Start     Ordered  ? 12/17/21 0000  Diet - low sodium heart healthy       ? 12/17/21 1439  ? ?  ?  ? ?  ? ?Cardiac diet ?DISCHARGE MEDICATION: ?Allergies as of 12/17/2021   ?No Known Allergies ?  ? ?  ?Medication List  ?  ? ?TAKE these medications   ? ?acetaminophen 325 MG tablet ?Commonly known as: TYLENOL ?Take 2 tablets (650 mg total) by mouth every 6 (six) hours as needed for mild pain (or Fever >/= 101). ?  ?albuterol (  2.5 MG/3ML) 0.083% nebulizer solution ?Commonly known as: PROVENTIL ?Take 3 mLs (2.5 mg total) by nebulization every 6 (six) hours as needed for wheezing or shortness of breath. ?  ?albuterol 108 (90 Base) MCG/ACT  inhaler ?Commonly known as: VENTOLIN HFA ?INHALE 2 PUFFS INTO THE LUNGS EVERY 6 HOURS AS NEEDED FOR WHEEZING OR SHORTNESS OF BREATH. ?  ?aspirin 81 MG EC tablet ?Take 1 tablet (81 mg total) by mouth daily. Swallow whole. ?Start taking on: December 18, 2021 ?  ?atorvastatin 10 MG tablet ?Commonly known as: LIPITOR ?Take 1 tablet (10 mg total) by mouth daily. ?  ?loratadine 10 MG tablet ?Commonly known as: CLARITIN ?Take 10 mg by mouth daily. ?  ?Trelegy Ellipta 200-62.5-25 MCG/ACT Aepb ?Generic drug: Fluticasone-Umeclidin-Vilant ?Inhale 1 puff into the lungs daily. ?  ? ?  ? ? Follow-up Information   ? ? Ronnie Doss M, DO. Schedule an appointment as soon as possible for a visit in 10 day(s).   ?Specialty: Family Medicine ?Contact information: ?[address] ?[place] [address] ?[phone] ? ? ?  ?  ? ? Phillips Odor, MD. Schedule an appointment as soon as possible for a visit in 4 week(s).   ?Specialty: Neurology ?Contact information: ?[address] ?[place] [address] ?[phone] ? ? ?  ?  ? ?  ?  ? ?  ? ?Discharge Exam: ?Filed Weights  ? 12/16/21 1447  ?Weight: 67.6 kg  ? ?General exam: Alert, awake, oriented x 3; no visual disturbances, headache or focal deficits reported. ?Respiratory system: Clear to auscultation. Respiratory effort normal.  Good saturation on room air. ?Cardiovascular system:RRR. No rubs or gallops; no JVD. ?Gastrointestinal system: Abdomen is nondistended, soft and nontender. No organomegaly or masses felt. Normal bowel sounds heard. ?Central nervous system: Alert and oriented. No focal neurological deficits. ?Extremities: No cyanosis or clubbing. ?Skin: No petechiae. ?Psychiatry: Judgement and insight appear normal. Mood & affect appropriate.  ? ? ?Condition at discharge: Stable and improved. ? ?The results of significant diagnostics from this hospitalization (including imaging, microbiology, ancillary and laboratory) are listed below for reference.  ? ?Imaging Studies: ?CT HEAD WO  CONTRAST ? ?Result Date: 12/16/2021 ?CLINICAL DATA:  Neurological deficit, intermittent loss of vision left eye EXAM: CT HEAD WITHOUT CONTRAST TECHNIQUE: Contiguous axial images were obtained from the base of the skull through the vertex without intravenous contrast. RADIATION DOSE REDUCTION: This exam was performed according to the departmental dose-optimization program which includes automated exposure control, adjustment of the mA and/or kV according to patient size and/or use of iterative reconstruction technique. COMPARISON:  None. FINDINGS: Brain: No acute intracranial findings are seen in the noncontrast CT brain. There are no signs of bleeding. Ventricles are not dilated. Cortical sulci are prominent. Vascular: Unremarkable. 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 Sinuses/Orbits: Paranasal sinuses are unremarkable. There is linear increased density in the region of lens in the anterior right optic globe. This may be residual from previous intervention. Other: None IMPRESSION: No acute intracranial findings are seen in noncontrast CT brain. Atrophy. Electronically Signed   By: Elmer Picker M.D.   On: 12/16/2021 18:31  ? ?MR BRAIN WO CONTRAST ? ?Result Date: 12/16/2021 ?CLINICAL DATA:  Neuro deficit, acute, stroke suspected EXAM: MRI HEAD WITHOUT CONTRAST TECHNIQUE: Multiplanar, multiecho pulse sequences of the brain and surrounding structures were obtained without intravenous contrast. COMPARISON:  None. FINDINGS: Brain: There is no acute infarction or intracranial hemorrhage. There is no intracranial mass, mass effect, or edema. There is no hydrocephalus or extra-axial fluid  collection. Ventricles and sulci are within normal limits in size and configuration. Patchy foci of T2 hyperintensity in the supratentorial white matter are nonspecific but may reflect mild to moderate chronic microvascular ischemic changes. Vascular: Major vessel flow voids at the skull base are preserved. Skull and upper  cervical spine: Normal marrow signal is preserved. Sinuses/Orbits: Paranasal sinuses are aerated. Bilateral lens replacements. Other: Sella is unremarkable. Mild patchy right mastoid fluid opacification. IMP

## 2021-12-17 NOTE — Evaluation (Signed)
Physical Therapy Evaluation ?Patient Details ?Name: Angela Parrish ?MRN: [id number] ?DOB: 09-12-1940 ?Today's Date: 12/17/2021 ? ?History of Present Illness ? Angela Parrish  is a 81 y.o. female, with past medical history of hypertension, breast cancer, PE in the past status post hip replacement, not anymore on anticoagulation.  -Patient presents with painless loss of vision in her left eye, reports symptoms started 2 days ago, woke up in the morning with entire vision loss in the left eye, no detection of light, this spontaneously improved and got back to normal vision later in the day, had only the bottom half of her vision which was completely blacked out,, this completely resolved by the end of the night, she saw her optometrist yesterday, who referred her to retina specialist for Monday in [place], but recommended she come to ED for stroke evaluation, patient denies any chest pain, shortness of breath, fever, chills, any focal deficits..  -In ED CT head, MRI brain with no acute findings, creatinine at 1.7 around her baseline, no other lab abnormalities, [facility] hospitalist consulted to admit. ? ?  ?Clinical Impression ? On therapist arrival; patient is sitting up in bed finishing breakfast; she is pleasant and agreeable to physical therapy evaluation.  Patient comes to EOB modified independently.  Sit to stand from EOB with therapist SBA. She is able to walk in the room and down the hallway x 150 ft without an AD; mild decreased gait speed but no loss of balance or path deviation noted.  Min guard from therapist .  Patient reports no pain; no issues with her vision during evaluation today.  Patient will benefit from continued therapy services in the hospital during her stay but is near her baseline and is not likely to need any further therapy services after her discharge from the hospital.  ?   ? ?Recommendations for follow up therapy are one component of a multi-disciplinary discharge planning process, led by the  attending physician.  Recommendations may be updated based on patient status, additional functional criteria and insurance authorization. ? ?Follow Up Recommendations No PT follow up ? ?  ?Assistance Recommended at Discharge PRN  ?Patient can return home with the following ? Help with stairs or ramp for entrance ? ?  ?Equipment Recommendations None recommended by PT  ?Recommendations for Other Services ?    ?  ?Functional Status Assessment Patient has had a recent decline in their functional status and demonstrates the ability to make significant improvements in function in a reasonable and predictable amount of time.  ? ?  ?Precautions / Restrictions Precautions ?Precautions: None  ? ?  ? ?Mobility ? Bed Mobility ?Overal bed mobility: Modified Independent ?  ?  ?  ?  ?  ?  ?General bed mobility comments: comes to EOB independently ?Patient Response: Cooperative ? ?Transfers ?Overall transfer level: Modified independent ?Equipment used: None ?  ?  ?  ?  ?  ?  ?  ?General transfer comment: sit to stand from edge of bed using upper extremities to push up; therapist SBA ?  ? ?Ambulation/Gait ?Ambulation/Gait assistance: Min guard, Supervision ?Gait Distance (Feet): 150 Feet ?Assistive device: None ?Gait Pattern/deviations: Decreased step length - left, Decreased step length - right ?  ?  ?  ?General Gait Details: slightly slower than normal gait speed ? ?Stairs ?  ?  ?  ?  ?  ? ?Wheelchair Mobility ?  ? ?Modified Rankin (Stroke Patients Only) ?  ? ?  ? ?Balance Overall balance assessment: Modified Independent ?  ?  ?  ?  ?  ?  ?  ?  ?  ?  ?  ?  ?  ?  ?  ?  ?  ?  ?   ? ? ? ?  Pertinent Vitals/Pain Pain Assessment ?Pain Assessment: 0-10 ?Pain Score: 0-No pain  ? ? ?Home Living Family/patient expects to be discharged to:: Private residence ?Living Arrangements: Spouse/significant other ?Available Help at Discharge: Family;Friend(s) ?Type of Home: House ?Home Access: Stairs to enter ?Entrance Stairs-Rails: Left ?Entrance  Stairs-Number of Steps: 4 ?  ?Home Layout: Multi-level;Laundry or work area in basement;Able to live on main level with bedroom/bathroom ?Home Equipment: Conservation officer, nature (2 wheels);Cane - single point;BSC/3in1 ?   ?  ?Prior Function Prior Level of Function : Independent/Modified Independent ?  ?  ?  ?  ?  ?  ?  ?  ?  ? ? ?Hand Dominance  ?   ? ?  ?Extremity/Trunk Assessment  ? Upper Extremity Assessment ?Upper Extremity Assessment: Overall WFL for tasks assessed ?  ? ?Lower Extremity Assessment ?Lower Extremity Assessment: Generalized weakness ?  ? ?Cervical / Trunk Assessment ?Cervical / Trunk Assessment: Normal  ?Communication  ? Communication: No difficulties  ?Cognition Arousal/Alertness: Awake/alert ?Behavior During Therapy: [facility] for tasks assessed/performed ?Overall Cognitive Status: Within Functional Limits for tasks assessed ?  ?  ?  ?  ?  ?  ?  ?  ?  ?  ?  ?  ?  ?  ?  ?  ?  ?  ?  ? ?  ?General Comments   ? ?  ?Exercises    ? ?Assessment/Plan  ?  ?PT Assessment Patient needs continued PT services  ?PT Problem List Decreased strength;Decreased mobility ? ?   ?  ?PT Treatment Interventions Gait training;Therapeutic exercise;Therapeutic activities;Balance training;Neuromuscular re-education;Functional mobility training   ? ?PT Goals (Current goals can be found in the Care Plan section)  ?Acute Rehab PT Goals ?Patient Stated Goal: return home ?PT Goal Formulation: With patient ?Time For Goal Achievement: 12/30/21 ?Potential to Achieve Goals: Good ? ?  ?Frequency Min 1X/week ?  ? ? ?Co-evaluation   ?  ?  ?  ?  ? ? ?  ?AM-PAC PT "6 Clicks" Mobility  ?Outcome Measure Help needed turning from your back to your side while in a flat bed without using bedrails?: None ?Help needed moving from lying on your back to sitting on the side of a flat bed without using bedrails?: None ?Help needed moving to and from a bed to a chair (including a wheelchair)?: None ?Help needed standing up from a chair using your arms (e.g.,  wheelchair or bedside chair)?: None ?Help needed to walk in hospital room?: None ?Help needed climbing 3-5 steps with a railing? : A Little ?6 Click Score: 23 ? ?  ?End of Session   ?Activity Tolerance: Patient tolerated treatment well ?Patient left: in bed;with call bell/phone within reach ?Nurse Communication: Mobility status ?PT Visit Diagnosis: Other abnormalities of gait and mobility (R26.89);Muscle weakness (generalized) (M62.81) ?  ? ?Time: 8768-1157 ?PT Time Calculation (min) (ACUTE ONLY): 16 min ? ? ?Charges:   PT Evaluation ?$PT Eval Low Complexity: 1 Low ?PT Treatments ?$Gait Training: 8-22 mins ?  ?   ? ? ?9:27 AM, 12/17/21 ?Morrie Daywalt Small Race Latour MPT ?[facility] ?[place] [phone] ?[phone] ? ?

## 2021-12-19 ENCOUNTER — Encounter (INDEPENDENT_AMBULATORY_CARE_PROVIDER_SITE_OTHER): Payer: Medicare HMO | Admitting: Ophthalmology

## 2021-12-19 ENCOUNTER — Telehealth: Payer: Self-pay | Admitting: Family Medicine

## 2021-12-19 ENCOUNTER — Telehealth: Payer: Self-pay

## 2021-12-19 DIAGNOSIS — H43813 Vitreous degeneration, bilateral: Secondary | ICD-10-CM | POA: Diagnosis not present

## 2021-12-19 DIAGNOSIS — H35033 Hypertensive retinopathy, bilateral: Secondary | ICD-10-CM | POA: Diagnosis not present

## 2021-12-19 DIAGNOSIS — H34212 Partial retinal artery occlusion, left eye: Secondary | ICD-10-CM | POA: Diagnosis not present

## 2021-12-19 DIAGNOSIS — G453 Amaurosis fugax: Secondary | ICD-10-CM | POA: Diagnosis not present

## 2021-12-19 DIAGNOSIS — I10 Essential (primary) hypertension: Secondary | ICD-10-CM | POA: Diagnosis not present

## 2021-12-19 NOTE — Telephone Encounter (Signed)
Transition Care Management Unsuccessful Follow-up Telephone Call ? ?Date of discharge and from where:  12/17/21 [facility] ? ?Attempts:  2nd Attempt ? ?Reason for unsuccessful TCM follow-up call:  Left voice message ? ?Pt had tried to call me back while I was at lunch, tried to call her back.  ? ? ? ?

## 2021-12-19 NOTE — Telephone Encounter (Signed)
Transition Care Management Unsuccessful Follow-up Telephone Call ? ?Date of discharge and from where:  12/17/21 [facility]. ? ?Attempts:  1st Attempt ? ?Reason for unsuccessful TCM follow-up call:  Left voice message ? ? ?

## 2021-12-19 NOTE — Telephone Encounter (Addendum)
Transition Care Management Follow-up Telephone Call ?Date of discharge and from where: 12/19/2021 [facility] ?How have you been since you were released from the hospital? Pt states she is doing well and vision is clear.  ?Any questions or concerns? No ? ?Items Reviewed: ?Did the pt receive and understand the discharge instructions provided? Yes  ?Medications obtained and verified? Yes  ?Other? No  ?Any new allergies since your discharge? No  ?Dietary orders reviewed? Yes ?Do you have support at home? Yes  ? ?Home Care and Equipment/Supplies: ?Were home health services ordered? no ?If so, what is the name of the agency? N/A  ?Has the agency set up a time to come to the patient's home? not applicable ?Were any new equipment or medical supplies ordered?  No ?What is the name of the medical supply agency? N/A ?Were you able to get the supplies/equipment? not applicable ?Do you have any questions related to the use of the equipment or supplies? No ? ?Functional Questionnaire: (I = Independent and D = Dependent) ?ADLs: I ? ?Bathing/Dressing- I ? ?Meal Prep- I ? ?Eating- I ? ?Maintaining continence- I ? ?Transferring/Ambulation- I ? ?Managing Meds- I ? ?Follow up appointments reviewed: ? ?PCP Hospital f/u appt confirmed?   Appt with Dr. Lajuana Ripple on 12/30/21 @ 9:20 am.    ?[facility] f/u appt confirmed? Yes  Scheduled to see Dr. Merlene Laughter in 4 weeks. ?Are transportation arrangements needed? No  ?If their condition worsens, is the pt aware to call PCP or go to the Emergency Dept.? Yes ?Was the patient provided with contact information for the PCP's office or ED? Yes ?Was to pt encouraged to call back with questions or concerns? Yes ? ?

## 2021-12-20 DIAGNOSIS — I129 Hypertensive chronic kidney disease with stage 1 through stage 4 chronic kidney disease, or unspecified chronic kidney disease: Secondary | ICD-10-CM | POA: Diagnosis not present

## 2021-12-20 DIAGNOSIS — N184 Chronic kidney disease, stage 4 (severe): Secondary | ICD-10-CM | POA: Diagnosis not present

## 2021-12-30 ENCOUNTER — Ambulatory Visit (INDEPENDENT_AMBULATORY_CARE_PROVIDER_SITE_OTHER): Payer: Medicare HMO | Admitting: Family Medicine

## 2021-12-30 ENCOUNTER — Encounter: Payer: Self-pay | Admitting: Family Medicine

## 2021-12-30 VITALS — BP 129/60 | HR 92 | Temp 97.3°F | Ht 63.0 in | Wt 151.0 lb

## 2021-12-30 DIAGNOSIS — M7072 Other bursitis of hip, left hip: Secondary | ICD-10-CM | POA: Diagnosis not present

## 2021-12-30 DIAGNOSIS — Z09 Encounter for follow-up examination after completed treatment for conditions other than malignant neoplasm: Secondary | ICD-10-CM

## 2021-12-30 DIAGNOSIS — G459 Transient cerebral ischemic attack, unspecified: Secondary | ICD-10-CM

## 2021-12-30 NOTE — Progress Notes (Signed)
? ?Subjective: ?CC: Hospital follow-up for TIA ?PCP: [name], DO ?Angela Parrish is a 81 y.o. female presenting to clinic today for: ? ?1.  TIA ?Patient was hospitalized after she had vision loss in the left eye.  She notes this was transient but recurred a second time that afternoon so she sought emergent care.  It is determined that she had a TIA and she is had total resolution of her visual disturbance and is seeing clearly out of both eyes as her baseline.  She has not seen her eye doctor and he performed a few studies, which ultimately revealed that she had some type of plaque behind her left eye in the vessel and somehow she did get ancillary blood flow past that plaque which restored her vision.  He agreed with aspirin use but was concerned that she perhaps was having uncontrolled blood pressures that may warrant blood pressure medication.  She has also reconvene with her renal specialist after hospitalization and they recommended no additional blood pressure medication as they were worried about hypoperfusion given her normotensive state.  They were in agreement of daily blood pressures for the next couple of months so as to monitor for any outliers.  She overall is feeling well and has no complaints today except for some left-sided hip pain.  She points to the ischial bursa as the area of discomfort.  She typically sits with her legs crossed.  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 ? ?ROS: Per HPI ? ?No Known Allergies ?Past Medical History:  ?Diagnosis Date  ? Abnormal Pap smear   ? Previous colposcopy  ? Acute pulmonary embolism ([facility]) 09/21/2018  ? 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 Asthma   ? childhood    ? Basal cell carcinoma 07/08/2001  ? Right Forehead (curet, excision)  ? Breast cancer ([facility]) 2000  ? Status post left lumpectomy  ? Closed fracture of third toe of right foot 03/13/2017  ? COPD  (chronic obstructive pulmonary disease) ([facility])   ? Dyspnea   ? on exertion ; improved with use Spiriva   ? Headache   ? occ  ? Hyperlipidemia   ? Personal history of radiation therapy 2000  ? Right leg DVT ([facility]) 09/22/2018  ? ? ?Current Outpatient Medications:  ?  acetaminophen (TYLENOL) 325 MG tablet, Take 2 tablets (650 mg total) by mouth every 6 (six) hours as needed for mild pain (or Fever >/= 101)., Disp: , Rfl:  ?  albuterol (PROVENTIL) (2.5 MG/3ML) 0.083% nebulizer solution, Take 3 mLs (2.5 mg total) by nebulization every 6 (six) hours as needed for wheezing or shortness of breath., Disp: 360 mL, Rfl: 12 ?  albuterol (VENTOLIN HFA) 108 (90 Base) MCG/ACT inhaler, INHALE 2 PUFFS INTO THE LUNGS EVERY 6 HOURS AS NEEDED FOR WHEEZING OR SHORTNESS OF BREATH., Disp: 18 each, Rfl: 0 ?  aspirin EC 81 MG EC tablet, Take 1 tablet (81 mg total) by mouth daily. Swallow whole., Disp: 30 tablet, Rfl: 11 ?  atorvastatin (LIPITOR) 10 MG tablet, Take 1 tablet (10 mg total) by mouth daily., Disp: 90 tablet, Rfl: 3 ?  Fluticasone-Umeclidin-Vilant (TRELEGY ELLIPTA) 200-62.5-25 MCG/INH AEPB, Inhale 1 puff into the lungs daily., Disp: 180 each, Rfl: 3 ?  loratadine (CLARITIN) 10 MG tablet, Take 10 mg by mouth daily., Disp: , Rfl:  ? ?Current Facility-Administered Medications:  ?  denosumab (PROLIA) injection  60 mg, 60 mg, Subcutaneous, Q6 months, Ronnie Doss M, DO, 60 mg at 12/06/21 1353 ?Social History  ? ?Socioeconomic History  ? Marital status: Significant Other  ?  Spouse name: Carlton Adam  ? Number of children: 1  ? Years of education: 50  ? Highest education level: Some college, no degree  ?Occupational History  ? Occupation: Retired  ?  Comment: [employer]  ?Tobacco Use  ? Smoking status: Former  ?  Types: Cigarettes  ?  Quit date: 08/14/2007  ?  Years since quitting: 14.3  ? Smokeless tobacco: Never  ?Vaping Use  ? Vaping Use: Never used  ?Substance and Sexual Activity  ? Alcohol use: No  ? Drug use: No  ? Sexual activity:  Yes  ?  Birth control/protection: Post-menopausal  ?Other Topics Concern  ? Not on file  ?Social History Narrative  ? Retired from [employer] age 29  ? She has 1 daughter who is 50 years old and lives in [place])  ? She has 2 grandsons age 82 and 51.  ? She has a significant other, Landscape architect.    ? No pets  ? She loves crafting and thrift stores  ? ?Social Determinants of Health  ? ?Financial Resource Strain: Low Risk   ? Difficulty of Paying Living Expenses: Not hard at all  ?Food Insecurity: No Food Insecurity  ? Worried About Charity fundraiser in the Last Year: Never true  ? Ran Out of Food in the Last Year: Never true  ?Transportation Needs: No Transportation Needs  ? Lack of Transportation (Medical): No  ? Lack of Transportation (Non-Medical): No  ?Physical Activity: Inactive  ? Days of Exercise per Week: 0 days  ? Minutes of Exercise per Session: 0 min  ?Stress: No Stress Concern Present  ? Feeling of Stress : Not at all  ?Social Connections: Moderately Isolated  ? Frequency of Communication with Friends and Family: More than three times a week  ? Frequency of Social Gatherings with Friends and Family: Three times a week  ? Attends Religious Services: Never  ? Active Member of Clubs or Organizations: No  ? Attends Archivist Meetings: Never  ? Marital Status: Living with partner  ?Intimate Partner Violence: Not At Risk  ? Fear of Current or Ex-Partner: No  ? Emotionally Abused: No  ? Physically Abused: No  ? Sexually Abused: No  ? ?Family History  ?Problem Relation Age of Onset  ? Heart disease Father   ? Lung cancer Father   ?     smoker  ? Heart attack Father   ? Colon cancer Mother 25  ? Heart attack Mother   ? Heart disease Sister   ? Hypertension Brother   ? Heart attack Brother   ? Leukemia Brother   ? Obesity Daughter   ? Anxiety disorder Daughter   ? Hypertension Brother   ? ? ?Objective: ?Office vital signs reviewed. ?BP 129/60   Pulse 92   Temp (!) 97.3 ?F (36.3 ?C)   Ht 5'  3" (1.6 m)   Wt 151 lb (68.5 kg)   SpO2 96%   BMI 26.75 kg/m?  ? ?Physical Examination:  ?General: Awake, alert, well nourished, No acute distress ?HEENT: PERRLA, EOMI, sclera white ?Cardio: regular rate and rhythm, S1S2 heard, no murmurs appreciated ?Pulm: clear to auscultation bilaterally, no wheezes, rhonchi or rales; normal work of breathing on room air ?MSK: Ambulating independently.  Normal gait and station.  No deformity to  the hip ? ?Assessment/ Plan: ?81 y.o. female  ? ?TIA (transient ischemic attack) - Plan: Basic Metabolic Panel ? ?Hospital discharge follow-up - Plan: Basic Metabolic Panel ? ?Ischial bursitis of left side ? ?I reviewed her hospitalization, discharge summary and she will get her records from hospital follow-up outpatient provider sent to me.  BMP was ordered as recommended by the hospital.  However, it sounds like her renal specialist has seen her already. ? ?For the ischial bursitis I have recommended topical NSAID if needed and or Tylenol if needed.  Home physical therapy exercises and stretches provided.  She will follow-up with her hip specialist if ongoing pain at which point maybe she may benefit from posterior steroid injection ? ? ? ?No orders of the defined types were placed in this encounter. ? ?No orders of the defined types were placed in this encounter. ? ? ?Today's visit is for Transitional Care Management. ? ?The patient was discharged from [facility] on 12/19/21 with a primary diagnosis of TIA.  ? ?Contact with the patient and/or caregiver, by a clinical staff member, was made on 12/19/2021 and was documented as a telephone encounter within the EMR. ? ?Through chart review and discussion with the patient I have determined that management of their condition is of moderate complexity.  ? ? ?[name], DO ?[place] ?[phone] ? ? ? ? ?

## 2022-01-16 ENCOUNTER — Other Ambulatory Visit: Payer: Self-pay | Admitting: Family Medicine

## 2022-01-16 DIAGNOSIS — J410 Simple chronic bronchitis: Secondary | ICD-10-CM

## 2022-01-25 ENCOUNTER — Encounter: Payer: Self-pay | Admitting: Family Medicine

## 2022-01-25 ENCOUNTER — Ambulatory Visit (INDEPENDENT_AMBULATORY_CARE_PROVIDER_SITE_OTHER): Payer: Medicare HMO | Admitting: Family Medicine

## 2022-01-25 VITALS — BP 107/59 | HR 78 | Temp 97.6°F | Ht 63.0 in | Wt 150.0 lb

## 2022-01-25 DIAGNOSIS — J069 Acute upper respiratory infection, unspecified: Secondary | ICD-10-CM | POA: Diagnosis not present

## 2022-01-25 MED ORDER — DOXYCYCLINE HYCLATE 100 MG PO TABS: 100.0000 mg | ORAL_TABLET | Freq: Two times a day (BID) | ORAL | 0 refills | Status: AC

## 2022-01-25 MED ORDER — LEVOCETIRIZINE DIHYDROCHLORIDE 5 MG PO TABS: 5.0000 mg | ORAL_TABLET | Freq: Every evening | ORAL | 3 refills | Status: DC

## 2022-01-25 MED ORDER — PREDNISONE 20 MG PO TABS: ORAL_TABLET | ORAL | 0 refills | Status: DC

## 2022-01-25 MED ORDER — LEVOCETIRIZINE DIHYDROCHLORIDE 5 MG PO TABS: 2.5000 mg | ORAL_TABLET | ORAL | 3 refills | Status: DC

## 2022-01-25 MED ORDER — BENZONATATE 100 MG PO CAPS: 100.0000 mg | ORAL_CAPSULE | Freq: Three times a day (TID) | ORAL | 0 refills | Status: DC | PRN

## 2022-01-25 NOTE — Progress Notes (Signed)
Subjective: CC: Drainage PCP: [name], DO Angela Parrish is a 81 y.o. female presenting to clinic today for:  1.  Drainage Patient reports mild cough and throat drainage that has been ongoing for about a few weeks.  The drainage seems to be what precipitates the cough.  She does not necessarily feel like it is down in her lungs.  She is been utilizing her normal allergy medicine.  When the cough medications wear off the symptoms return.  No hemoptysis, shortness of breath.  Sometimes she does feel like she is wheezing though and sometimes it feels like it is hard to take a deep breath in.  She denies any hemoptysis or fevers.  No known sick contacts.   ROS: Per HPI  No Known Allergies Past Medical History:  Diagnosis Date   Abnormal Pap smear    Previous colposcopy   Acute pulmonary embolism ([facility]) 09/21/2018   Arthritis    hip    Asthma    childhood     Basal cell carcinoma 07/08/2001   Right Forehead (curet, excision)   Breast cancer ([facility]) 2000   Status post left lumpectomy   Closed fracture of third toe of right foot 03/13/2017   COPD (chronic obstructive pulmonary disease) ([facility])    Dyspnea    on exertion ; improved with use Spiriva    Headache    occ   Hyperlipidemia    Personal history of radiation therapy 2000   Right leg DVT ([facility]) 09/22/2018    Current Outpatient Medications:    acetaminophen (TYLENOL) 325 MG tablet, Take 2 tablets (650 mg total) by mouth every 6 (six) hours as needed for mild pain (or Fever >/= 101)., Disp: , Rfl:    albuterol (PROVENTIL) (2.5 MG/3ML) 0.083% nebulizer solution, Take 3 mLs (2.5 mg total) by nebulization every 6 (six) hours as needed for wheezing or shortness of breath., Disp: 360 mL, Rfl: 12   albuterol (VENTOLIN HFA) 108 (90 Base) MCG/ACT inhaler, INHALE 2 PUFFS INTO THE LUNGS EVERY 6 HOURS AS NEEDED FOR WHEEZING OR SHORTNESS OF BREATH., Disp: 18 each, Rfl: 0   aspirin EC 81 MG EC tablet, Take 1 tablet (81 mg total) by  mouth daily. Swallow whole., Disp: 30 tablet, Rfl: 11   atorvastatin (LIPITOR) 10 MG tablet, Take 1 tablet (10 mg total) by mouth daily., Disp: 90 tablet, Rfl: 3   loratadine (CLARITIN) 10 MG tablet, Take 10 mg by mouth daily., Disp: , Rfl:    TRELEGY ELLIPTA 200-62.5-25 MCG/ACT AEPB, INHALE 1 PUFF EVERY DAY, Disp: 180 each, Rfl: 1  Current Facility-Administered Medications:    denosumab (PROLIA) injection 60 mg, 60 mg, Subcutaneous, Q6 months, Kameran Lallier M, DO, 60 mg at 12/06/21 1353 Social History   Socioeconomic History   Marital status: Significant Other    Spouse name: Landscape architect   Number of children: 1   Years of education: 12   Highest education level: Some college, no degree  Occupational History   Occupation: Retired    Comment: [employer]  Tobacco Use   Smoking status: Former    Types: Cigarettes    Quit date: 08/14/2007    Years since quitting: 14.4   Smokeless tobacco: Never  Vaping Use   Vaping Use: Never used  Substance and Sexual Activity   Alcohol use: No   Drug use: No   Sexual activity: Yes    Birth control/protection: Post-menopausal  Other Topics Concern   Not on file  Social History Narrative  Retired from [employer] age 81   She has 1 daughter who is 67 years old and lives in [place])   She has 2 grandsons age 49 and 62.   She has a significant other, Landscape architect.     No pets   She loves crafting and thrift stores   Social Determinants of Health   Financial Resource Strain: Low Risk    Difficulty of Paying Living Expenses: Not hard at all  Food Insecurity: No Food Insecurity   Worried About Charity fundraiser in the Last Year: Never true   Arboriculturist in the Last Year: Never true  Transportation Needs: No Transportation Needs   Lack of Transportation (Medical): No   Lack of Transportation (Non-Medical): No  Physical Activity: Inactive   Days of Exercise per Week: 0 days   Minutes of Exercise per Session: 0 min  Stress:  No Stress Concern Present   Feeling of Stress : Not at all  Social Connections: Moderately Isolated   Frequency of Communication with Friends and Family: More than three times a week   Frequency of Social Gatherings with Friends and Family: Three times a week   Attends Religious Services: Never   Active Member of Clubs or Organizations: No   Attends Music therapist: Never   Marital Status: Living with partner  Intimate Partner Violence: Not At Risk   Fear of Current or Ex-Partner: No   Emotionally Abused: No   Physically Abused: No   Sexually Abused: No   Family History  Problem Relation Age of Onset   Heart disease Father    Lung cancer Father        smoker   Heart attack Father    Colon cancer Mother 63   Heart attack Mother    Heart disease Sister    Hypertension Brother    Heart attack Brother    Leukemia Brother    Obesity Daughter    Anxiety disorder Daughter    Hypertension Brother     Objective: Office vital signs reviewed. BP (!) 107/59   Pulse 78   Temp 97.6 F (36.4 C)   Ht '5\' 3"'$  (1.6 m)   Wt 150 lb (68 kg)   SpO2 95%   BMI 26.57 kg/m   Physical Examination:  General: Awake, alert, well nourished, No acute distress HEENT: Sclera white.  TMs intact bilaterally.  No erythema.  No bulging.  Nares with some clear drainage.  Oropharynx with mild cobblestone appearance but no oropharyngeal lesions or tonsillar enlargement Cardio: regular rate and rhythm, S1S2 heard, no murmurs appreciated Pulm: clear to auscultation bilaterally, no wheezes, rhonchi or rales; normal work of breathing on room air  Assessment/ Plan: 81 y.o. female   Viral URI with cough - Plan: benzonatate (TESSALON PERLES) 100 MG capsule, levocetirizine (XYZAL) 5 MG tablet, DISCONTINUED: levocetirizine (XYZAL) 5 MG tablet  Favor that this is a viral URI.  Xyzal sent in to replace current antihistamine.  Tessalon Perles as needed cough.  We discussed red flag signs symptoms  warranting further evaluation.  Written prescription provided today for prednisone and antibiotic should she start developing signs or symptoms suggestive of COPD exacerbation which I do not believe is going on at this time.  No orders of the defined types were placed in this encounter.  Meds ordered this encounter  Medications   DISCONTD: levocetirizine (XYZAL) 5 MG tablet    Sig: Take 1 tablet (5 mg total) by mouth every  evening. For allergies and drainage    Dispense:  90 tablet    Refill:  3   benzonatate (TESSALON PERLES) 100 MG capsule    Sig: Take 1 capsule (100 mg total) by mouth 3 (three) times daily as needed.    Dispense:  20 capsule    Refill:  0   predniSONE (DELTASONE) 20 MG tablet    Sig: 2 po at same time daily for 5 days    Dispense:  10 tablet    Refill:  0   doxycycline (VIBRA-TABS) 100 MG tablet    Sig: Take 1 tablet (100 mg total) by mouth 2 (two) times daily for 7 days.    Dispense:  14 tablet    Refill:  0   levocetirizine (XYZAL) 5 MG tablet    Sig: Take 0.5 tablets (2.5 mg total) by mouth every other day. RENAL DOSING For allergies and drainage    Dispense:  90 tablet    Refill:  [place], [place] [phone]

## 2022-01-25 NOTE — Patient Instructions (Signed)

## 2022-02-14 ENCOUNTER — Ambulatory Visit (INDEPENDENT_AMBULATORY_CARE_PROVIDER_SITE_OTHER): Payer: Medicare HMO | Admitting: Family Medicine

## 2022-02-14 ENCOUNTER — Encounter: Payer: Self-pay | Admitting: Family Medicine

## 2022-02-14 DIAGNOSIS — U071 COVID-19: Secondary | ICD-10-CM

## 2022-02-14 MED ORDER — MOLNUPIRAVIR EUA 200MG CAPSULE: 4.0000 | ORAL_CAPSULE | Freq: Two times a day (BID) | ORAL | 0 refills | Status: DC

## 2022-02-14 NOTE — Progress Notes (Signed)
    Subjective:    Patient ID: Angela Parrish, female    DOB: [birth year], 81 y.o.   MRN: [id number]   HPI: Angela Parrish is a 81 y.o. female presenting for covid positive yesterday. Having chills, fever, head stopped up. No cough. Having yellow phlegm. No dyspnea. Has COPD.       01/25/2022   11:21 AM 12/30/2021    9:19 AM 08/31/2021    9:49 AM 08/08/2021    9:02 AM 03/03/2021    8:20 AM  Depression screen PHQ 2/9  Decreased Interest 0 0 0 0 0  Down, Depressed, Hopeless 0 0 0 0 0  PHQ - 2 Score 0 0 0 0 0     Relevant past medical, surgical, family and social history reviewed and updated as indicated.  Interim medical history since our last visit reviewed. Allergies and medications reviewed and updated.  ROS:  Review of Systems  Constitutional: Negative.   HENT: Negative.    Eyes:  Negative for visual disturbance.  Respiratory:  Negative for shortness of breath.   Cardiovascular:  Negative for chest pain.  Gastrointestinal:  Negative for abdominal pain and nausea.  Musculoskeletal:  Negative for arthralgias.  Skin:  Negative for rash.      Social History   Tobacco Use  Smoking Status Former   Types: Cigarettes   Quit date: 08/14/2007   Years since quitting: 14.5  Smokeless Tobacco Never       Objective:     Wt Readings from Last 3 Encounters:  01/25/22 150 lb (68 kg)  12/30/21 151 lb (68.5 kg)  12/16/21 149 lb (67.6 kg)     Exam deferred. Pt. Harboring due to COVID 19. Phone visit performed.   Assessment & Plan:   1. COVID-19 virus infection     Meds ordered this encounter  Medications   molnupiravir EUA (LAGEVRIO) 200 mg CAPS capsule    Sig: Take 4 capsules (800 mg total) by mouth 2 (two) times daily for 5 days.    Dispense:  40 capsule    Refill:  0    No orders of the defined types were placed in this encounter.     Diagnoses and all orders for this visit:  COVID-19 virus infection  Other orders -     molnupiravir EUA (LAGEVRIO) 200 mg CAPS  capsule; Take 4 capsules (800 mg total) by mouth 2 (two) times daily for 5 days.    Virtual Visit via telephone Note  I discussed the limitations, risks, security and privacy concerns of performing an evaluation and management service by telephone and the availability of in person appointments. The patient was identified with two identifiers. Pt.expressed understanding and agreed to proceed. Pt. Is at home. Dr. Darlyn Read is in his office.  Follow Up Instructions:   I discussed the assessment and treatment plan with the patient. The patient was provided an opportunity to ask questions and all were answered. The patient agreed with the plan and demonstrated an understanding of the instructions.   The patient was advised to call back or seek an in-person evaluation if the symptoms worsen or if the condition fails to improve as anticipated.   Total minutes including chart review and phone contact time: 6   Follow up plan: Return if symptoms worsen or fail to improve.  Mechele Claude, MD [facility]

## 2022-02-15 ENCOUNTER — Encounter (INDEPENDENT_AMBULATORY_CARE_PROVIDER_SITE_OTHER): Payer: Medicare HMO | Admitting: Ophthalmology

## 2022-02-17 ENCOUNTER — Ambulatory Visit (INDEPENDENT_AMBULATORY_CARE_PROVIDER_SITE_OTHER): Payer: Medicare HMO

## 2022-02-17 VITALS — Wt 143.0 lb

## 2022-02-17 DIAGNOSIS — Z0000 Encounter for general adult medical examination without abnormal findings: Secondary | ICD-10-CM

## 2022-02-17 NOTE — Patient Instructions (Addendum)
[name] , Thank you for taking time to come for your Medicare Wellness Visit. I appreciate your ongoing commitment to your health goals. Please review the following plan we discussed and let me know if I can assist you in the future.   Screening recommendations/referrals: Colonoscopy: Done 09/16/2014 - no repeat required due to age 81: Done 05/17/2021 - Repeat annually Bone Density: Done 05/26/2020 - Repeat every 2 years Recommended yearly ophthalmology/optometry visit for glaucoma screening and checkup Recommended yearly dental visit for hygiene and checkup  Vaccinations: Influenza vaccine: Done 05/23/2021 - Repeat annually  Pneumococcal vaccine: Done 06/25/2014 & 06/20/2017 Tdap vaccine: Done 11/22/2016 - Repeat in 10 years Shingles vaccine: Done 08/31/2021 - appointment made for second dose 02/23/22 Covid-19: Done  10/21/2019, 11/18/2019, 07/06/2020, 03/01/2021, & 07/05/2021  Advanced directives: in chart  Conditions/risks identified: Aim for 30 minutes of exercise or walking, 6-8 glasses of water, and 5 servings of fruits and vegetables each day.   Next appointment: Follow up in one year for your annual wellness visit    Preventive Care 65 Years and Older, Female Preventive care refers to lifestyle choices and visits with your health care provider that can promote health and wellness. What does preventive care include? A yearly physical exam. This is also called an annual well check. Dental exams once or twice a year. Routine eye exams. Ask your health care provider how often you should have your eyes checked. Personal lifestyle choices, including: Daily care of your teeth and gums. Regular physical activity. Eating a healthy diet. Avoiding tobacco and drug use. Limiting alcohol use. Practicing safe sex. Taking low-dose aspirin every day. Taking vitamin and mineral supplements as recommended by your health care provider. What happens during an annual well check? The services  and screenings done by your health care provider during your annual well check will depend on your age, overall health, lifestyle risk factors, and family history of disease. Counseling  Your health care provider may ask you questions about your: Alcohol use. Tobacco use. Drug use. Emotional well-being. Home and relationship well-being. Sexual activity. Eating habits. History of falls. Memory and ability to understand (cognition). Work and work Statistician. Reproductive health. Screening  You may have the following tests or measurements: Height, weight, and BMI. Blood pressure. Lipid and cholesterol levels. These may be checked every 5 years, or more frequently if you are over 44 years old. Skin check. Lung cancer screening. You may have this screening every year starting at age 93 if you have a 30-pack-year history of smoking and currently smoke or have quit within the past 15 years. Fecal occult blood test (FOBT) of the stool. You may have this test every year starting at age 60. Flexible sigmoidoscopy or colonoscopy. You may have a sigmoidoscopy every 5 years or a colonoscopy every 10 years starting at age 41. Hepatitis C blood test. Hepatitis B blood test. Sexually transmitted disease (STD) testing. Diabetes screening. This is done by checking your blood sugar (glucose) after you have not eaten for a while (fasting). You may have this done every 1-3 years. Bone density scan. This is done to screen for osteoporosis. You may have this done starting at age 60. Mammogram. This may be done every 1-2 years. Talk to your health care provider about how often you should have regular mammograms. Talk with your health care provider about your test results, treatment options, and if necessary, the need for more tests. Vaccines  Your health care provider may recommend certain vaccines, such as:  Influenza vaccine. This is recommended every year. Tetanus, diphtheria, and acellular pertussis  (Tdap, Td) vaccine. You may need a Td booster every 10 years. Zoster vaccine. You may need this after age 14. Pneumococcal 13-valent conjugate (PCV13) vaccine. One dose is recommended after age 14. Pneumococcal polysaccharide (PPSV23) vaccine. One dose is recommended after age 39. Talk to your health care provider about which screenings and vaccines you need and how often you need them. This information is not intended to replace advice given to you by your health care provider. Make sure you discuss any questions you have with your health care provider. Document Released: 09/03/2015 Document Revised: 04/26/2016 Document Reviewed: 06/08/2015 Elsevier Interactive Patient Education  2017 [place] Prevention in the Home Falls can cause injuries. They can happen to people of all ages. There are many things you can do to make your home safe and to help prevent falls. What can I do on the outside of my home? Regularly fix the edges of walkways and driveways and fix any cracks. Remove anything that might make you trip as you walk through a door, such as a raised step or threshold. Trim any bushes or trees on the path to your home. Use bright outdoor lighting. Clear any walking paths of anything that might make someone trip, such as rocks or tools. Regularly check to see if handrails are loose or broken. Make sure that both sides of any steps have handrails. Any raised decks and porches should have guardrails on the edges. Have any leaves, snow, or ice cleared regularly. Use sand or salt on walking paths during winter. Clean up any spills in your garage right away. This includes oil or grease spills. What can I do in the bathroom? Use night lights. Install grab bars by the toilet and in the tub and shower. Do not use towel bars as grab bars. Use non-skid mats or decals in the tub or shower. If you need to sit down in the shower, use a plastic, non-slip stool. Keep the floor dry. Clean  up any water that spills on the floor as soon as it happens. Remove soap buildup in the tub or shower regularly. Attach bath mats securely with double-sided non-slip rug tape. Do not have throw rugs and other things on the floor that can make you trip. What can I do in the bedroom? Use night lights. Make sure that you have a light by your bed that is easy to reach. Do not use any sheets or blankets that are too big for your bed. They should not hang down onto the floor. Have a firm chair that has side arms. You can use this for support while you get dressed. Do not have throw rugs and other things on the floor that can make you trip. What can I do in the kitchen? Clean up any spills right away. Avoid walking on wet floors. Keep items that you use a lot in easy-to-reach places. If you need to reach something above you, use a strong step stool that has a grab bar. Keep electrical cords out of the way. Do not use floor polish or wax that makes floors slippery. If you must use wax, use non-skid floor wax. Do not have throw rugs and other things on the floor that can make you trip. What can I do with my stairs? Do not leave any items on the stairs. Make sure that there are handrails on both sides of the stairs and use them.  Fix handrails that are broken or loose. Make sure that handrails are as long as the stairways. Check any carpeting to make sure that it is firmly attached to the stairs. Fix any carpet that is loose or worn. Avoid having throw rugs at the top or bottom of the stairs. If you do have throw rugs, attach them to the floor with carpet tape. Make sure that you have a light switch at the top of the stairs and the bottom of the stairs. If you do not have them, ask someone to add them for you. What else can I do to help prevent falls? Wear shoes that: Do not have high heels. Have rubber bottoms. Are comfortable and fit you well. Are closed at the toe. Do not wear sandals. If you  use a stepladder: Make sure that it is fully opened. Do not climb a closed stepladder. Make sure that both sides of the stepladder are locked into place. Ask someone to hold it for you, if possible. Clearly mark and make sure that you can see: Any grab bars or handrails. First and last steps. Where the edge of each step is. Use tools that help you move around (mobility aids) if they are needed. These include: Canes. Walkers. Scooters. Crutches. Turn on the lights when you go into a dark area. Replace any light bulbs as soon as they burn out. Set up your furniture so you have a clear path. Avoid moving your furniture around. If any of your floors are uneven, fix them. If there are any pets around you, be aware of where they are. Review your medicines with your doctor. Some medicines can make you feel dizzy. This can increase your chance of falling. Ask your doctor what other things that you can do to help prevent falls. This information is not intended to replace advice given to you by your health care provider. Make sure you discuss any questions you have with your health care provider. Document Released: 06/03/2009 Document Revised: 01/13/2016 Document Reviewed: 09/11/2014 Elsevier Interactive Patient Education  2017 [place] for Chronic Kidney Disease Chronic kidney disease (CKD) occurs when the kidneys are permanently damaged over a long period of time. When your kidneys are not working well, they cannot remove waste, fluids, and other substances from your blood as well as they did before. The substances can build up, which can worsen kidney damage and affect how your body functions. Certain foods lead to a buildup of these substances. By changing your diet, you can help prevent more kidney damage and delay or prevent the need for dialysis. What are tips for following this plan? Reading food labels Check the amount of salt (sodium) in foods. Choose foods that have  less than 300 milligrams (mg) per serving. Check the ingredient list for phosphorus or potassium-based additives or preservatives. Check the amount of saturated fat and trans fat. Limit or avoid these fats as told by your dietitian. Shopping Avoid buying foods that are: Processed or prepackaged. Calcium-enriched or that have calcium added to them (are fortified). Do not buy foods that have salt or sodium listed among the first five ingredients. Buy canned vegetables and beans that say "no salt added" or "low sodium" and rinse them before eating. Cooking Soak vegetables, such as potatoes, before cooking to reduce potassium. To do this: Peel and cut the vegetables into small pieces. Soak the vegetables in warm water for at least 2 hours. For every 1 cup of vegetables, use  10 cups of water. Drain and rinse the vegetables with warm water. Boil the vegetables for at least 5 minutes. Meal planning Limit the amount of protein you eat from plant and animal sources each day. Do not add salt to food when cooking or before eating. Eat meals and snacks at around the same time each day. General information Talk with your health care provider about whether you should take a vitamin and mineral supplement. Use standard measuring cups and spoons to measure servings of foods. Use a kitchen scale to measure portions of protein foods. If told by your health care provider, avoid drinking too much fluid. Measure and count all liquids, including water, ice, soups, flavored gelatin, and frozen desserts such as ice pops or ice cream. If you have diabetes: If you have diabetes (diabetes mellitus) and CKD, it is important to keep your blood sugar (glucose) in the target range recommended by your health care provider. Follow your diabetes management plan. This may include: Checking your blood glucose regularly. Taking medicines by mouth, taking insulin, or taking both. Exercising for at least 30 minutes on 5 or more  days each week, or as told by your health care provider. Tracking how many servings of carbohydrates you eat at each meal. You may be given specific guidelines on how much of certain foods and nutrients you may eat, depending on your stage of kidney disease and whether you have high blood pressure (hypertension). Follow your meal plan as told by your dietitian. What nutrients should I limit? Work with your health care provider and dietitian to develop a meal plan that is right for you. Foods you can eat and foods you should limit or avoid will depend on the stage of your kidney disease and any other health conditions you have. The items listed below are not a complete list. Talk with your dietitian about what dietary choices are best for you. Potassium Potassium affects how steadily your heart beats. If too much potassium builds up in your blood, the potassium can cause an irregular heartbeat or even a heart attack. You may need to limit or avoid foods that are high in potassium, such as: Milk and soy milk. Fruits, such as bananas, apricots, nectarines, melon, prunes, raisins, kiwi, and oranges. Vegetables, such as potatoes, sweet potatoes, yams, tomatoes, leafy greens, beets, avocado, pumpkin, and winter squash. White and lima beans. Whole-wheat breads and pastas. Beans and nuts. Phosphorus Phosphorus is a mineral found in your bones. A balance between calcium and phosphorus is needed to build and maintain healthy bones. Too much phosphorus pulls calcium from your bones. This can make your bones weak and more likely to break. Too much phosphorus can also make your skin itch. You may need to limit or avoid foods that are high in phosphorus, such as: Milk and dairy products. Dried beans and peas. Tofu, soy milk, and other soy-based meat replacements. Dark-colored sodas. Nuts and peanut butter. Meat, poultry, and fish. Bran cereals and oatmeal. Protein  Protein helps you make and keep muscle.  It also helps to repair your body's cells and tissues. One of the natural breakdown products of protein is a waste product called urea. When your kidneys are not working properly, they cannot remove wastes, such as urea. Reducing how much protein you eat can help prevent a buildup of urea in your blood. Depending on your stage of kidney disease, you may need to limit foods that are high in protein. Sources of animal protein include: Meat (all  types). Fish and seafood. Poultry. Eggs. Dairy. Other protein foods include: Beans and legumes. Nuts and nut butter. Soy and tofu.  Sodium Sodium helps to maintain a healthy balance of fluids in your body. Too much sodium can increase your blood pressure and have a negative effect on your heart and lungs. Too much sodium can also cause your body to retain too much fluid, making your kidneys work harder. Most people should have less than 2,300 mg of sodium each day. If you have hypertension, you may need to limit your sodium to 1,500 mg each day. You may need to limit or avoid foods that are high in sodium, such as: Salt seasonings. Soy sauce. Cured and processed meats. Salted crackers and snack foods. Fast food. Canned soups and most canned foods. Pickled foods. Vegetable juice. Boxed mixes or ready-to-eat boxed meals and side dishes. Bottled dressings, sauces, and marinades. Talk with your dietitian about how much potassium, phosphorus, protein, and sodium you may have each day. Summary Chronic kidney disease (CKD) can lead to a buildup of waste and extra substances in the body. Certain foods lead to a buildup of these substances. By changing your diet as told, you can help prevent more kidney damage and delay or prevent the need for dialysis. Food intake changes are different for each person with CKD. Work with a dietitian to set up nutrient goals and a meal plan that is right for you. If you have diabetes and CKD, it is important to keep your  blood sugar in the target range recommended by your health care provider. This information is not intended to replace advice given to you by your health care provider. Make sure you discuss any questions you have with your health care provider. Document Revised: 12/01/2019 Document Reviewed: 12/01/2019 Elsevier Patient Education  [place].  COPD and Physical Activity Chronic obstructive pulmonary disease (COPD) is a long-term, or chronic, condition that affects the lungs. COPD is a general term that can be used to describe many problems that cause inflammation of the lungs and limit airflow. These conditions include chronic bronchitis and emphysema. The main symptom of COPD is shortness of breath, which makes it harder to do even simple tasks. This can also make it harder to exercise and stay active. Talk with your health care provider about treatments to help you breathe better and actions you can take to prevent breathing problems during physical activity. What are the benefits of exercising when you have COPD? Exercising regularly is an important part of a healthy lifestyle. You can still exercise and do physical activities even though you have COPD. Exercise and physical activity improve your shortness of breath by increasing blood flow (circulation). This causes your heart to pump more oxygen through your body. Moderate exercise can: Improve oxygen use. Increase your energy level. Help with shortness of breath. Strengthen your breathing muscles. Improve heart health. Help with sleep. Improve your self-esteem and feelings of self-worth. Lower depression, stress, and anxiety. Exercise can benefit everyone with COPD. The severity of your disease may affect how hard you can exercise, especially at first, but everyone can benefit. Talk with your health care provider about how much exercise is safe for you, and which activities and exercises are safe for you. What actions can I take to  prevent breathing problems during physical activity? Sign up for a pulmonary rehabilitation program. This type of program may include: Education about lung diseases. Exercise classes that teach you how to exercise and be more  active while improving your breathing. This usually involves: Exercise using your lower extremities, such as a stationary bicycle. About 30 minutes of exercise, 2 to 5 times per week, for 6 to 12 weeks. Strength training, such as push-ups or leg lifts. Nutrition education. Group classes in which you can talk with others who also have COPD and learn ways to manage stress. If you use an oxygen tank, you should use it while you exercise. Work with your health care provider to adjust your oxygen for your physical activity. Your resting flow rate is different from your flow rate during physical activity. How to manage your breathing while exercising While you are exercising: Take slow breaths. Pace yourself, and do nottry to go too fast. Purse your lips while breathing out. Pursing your lips is similar to a kissing or whistling position. If doing exercise that uses a quick burst of effort, such as weight lifting: Breathe in before starting the exercise. Breathe out during the hardest part of the exercise, such as raising the weights. Where to find support You can find support for exercising with COPD from: Your health care provider. A pulmonary rehabilitation program. Your local health department or community health programs. Support groups, either online or in-person. Your health care provider may be able to recommend support groups. Where to find more information You can find more information about exercising with COPD from: American Lung Association: lung.org COPD Foundation: copdfoundation.org Contact a health care provider if: Your symptoms get worse. You have nausea. You have a fever. You want to start a new exercise program or a new activity. Get help right  away if: You have chest pain. You cannot breathe. These symptoms may represent a serious problem that is an emergency. Do not wait to see if the symptoms will go away. Get medical help right away. Call your local emergency services (911 in the [place] Do not drive yourself to the hospital. Summary COPD is a general term that can be used to describe many different lung problems that cause lung inflammation and limit airflow. This includes chronic bronchitis and emphysema. Exercise and physical activity improve your shortness of breath by increasing blood flow (circulation). This causes your heart to provide more oxygen to your body. Contact your health care provider before starting any exercise program or new activity. Ask your health care provider what exercises and activities are safe for you. This information is not intended to replace advice given to you by your health care provider. Make sure you discuss any questions you have with your health care provider. Document Revised: 06/15/2020 Document Reviewed: 06/15/2020 Elsevier Patient Education  [place].

## 2022-02-17 NOTE — Progress Notes (Signed)
Subjective:   Angela Parrish is a 81 y.o. female who presents for Medicare Annual (Subsequent) preventive examination.  Virtual Visit via Telephone Note  I connected with  Angela Parrish on 02/17/22 at  9:00 AM EDT by telephone and verified that I am speaking with the correct person using two identifiers.  Location: Patient: Home Provider: [facility] Persons participating in the virtual visit: patient/Nurse Health Advisor   I discussed the limitations, risks, security and privacy concerns of performing an evaluation and management service by telephone and the availability of in person appointments. The patient expressed understanding and agreed to proceed.  Interactive audio and video telecommunications were attempted between this nurse and patient, however failed, due to patient having technical difficulties OR patient did not have access to video capability.  We continued and completed visit with audio only.  Some vital signs may be absent or patient reported.   Rollin Kotowski E Creed Kail, LPN   Review of Systems     Cardiac Risk Factors include: advanced age (>49mn, >>45women);dyslipidemia;hypertension;sedentary lifestyle;Other (see comment), Risk factor comments: hx of TIA, COPD, CKD stage 4     Objective:    Today's Vitals   02/17/22 0858  Weight: 143 lb (64.9 kg)   Body mass index is 25.33 kg/m.     02/17/2022    9:13 AM 12/16/2021    8:00 PM 05/19/2019    8:29 AM 09/22/2018    2:33 PM 09/21/2018    4:56 PM 09/21/2018    9:46 AM 07/17/2018    4:13 PM  Advanced Directives  Does Patient Have a Medical Advance Directive? Yes No Yes No Yes Yes No  Type of [place]amed[place] will  [place] will Healthcare Power of [place]  Does patient want to make changes to medical advance directive?   No - Patient declined No - Patient declined No - Patient declined No - Patient declined   Copy of  [place] Chart? Yes - validated most recent copy scanned in chart (See row information)  Yes - validated most recent copy scanned in chart (See row information) No - copy requested No - copy requested No - copy requested   Would patient like information on creating a medical advance directive?  No - Patient declined  No - Patient declined No - Patient declined No - Patient declined No - Patient declined    Current Medications (verified) Outpatient Encounter Medications as of 02/17/2022  Medication Sig   acetaminophen (TYLENOL) 325 MG tablet Take 2 tablets (650 mg total) by mouth every 6 (six) hours as needed for mild pain (or Fever >/= 101).   albuterol (PROVENTIL) (2.5 MG/3ML) 0.083% nebulizer solution Take 3 mLs (2.5 mg total) by nebulization every 6 (six) hours as needed for wheezing or shortness of breath.   albuterol (VENTOLIN HFA) 108 (90 Base) MCG/ACT inhaler INHALE 2 PUFFS INTO THE LUNGS EVERY 6 HOURS AS NEEDED FOR WHEEZING OR SHORTNESS OF BREATH.   aspirin EC 81 MG EC tablet Take 1 tablet (81 mg total) by mouth daily. Swallow whole.   atorvastatin (LIPITOR) 10 MG tablet Take 1 tablet (10 mg total) by mouth daily.   loratadine (CLARITIN) 10 MG tablet Take 10 mg by mouth daily.   TRELEGY ELLIPTA 200-62.5-25 MCG/ACT AEPB INHALE 1 PUFF EVERY DAY   benzonatate (TESSALON PERLES) 100 MG capsule Take 1 capsule (100 mg total) by mouth 3 (three) times daily as  needed. (Patient not taking: Reported on 02/17/2022)   [DISCONTINUED] levocetirizine (XYZAL) 5 MG tablet Take 0.5 tablets (2.5 mg total) by mouth every other day. RENAL DOSING For allergies and drainage (Patient not taking: Reported on 02/17/2022)   [DISCONTINUED] molnupiravir EUA (LAGEVRIO) 200 mg CAPS capsule Take 4 capsules (800 mg total) by mouth 2 (two) times daily for 5 days.   [DISCONTINUED] predniSONE (DELTASONE) 20 MG tablet 2 po at same time daily for 5 days   Facility-Administered Encounter Medications as of  02/17/2022  Medication   denosumab (PROLIA) injection 60 mg    Allergies (verified) Patient has no known allergies.   History: Past Medical History:  Diagnosis Date   Abnormal Pap smear    Previous colposcopy   Acute pulmonary embolism ([place]) 09/21/2018   Arthritis    hip    Asthma    childhood     Basal cell carcinoma 07/08/2001   Right Forehead (curet, excision)   Breast cancer ([place]) 2000   Status post left lumpectomy   Closed fracture of third toe of right foot 03/13/2017   COPD (chronic obstructive pulmonary disease) (HCC)    Dyspnea    on exertion ; improved with use Spiriva    Headache    occ   Hyperlipidemia    Personal history of radiation therapy 2000   Right leg DVT ([place]) 09/22/2018   Past Surgical History:  Procedure Laterality Date   BREAST LUMPECTOMY Left 2000   CATARACT EXTRACTION, BILATERAL     COLONOSCOPY N/A 09/16/2014   Procedure: COLONOSCOPY;  Surgeon: Rogene Houston, MD;  Location[facility];  Service: Endoscopy;  Laterality: N/A;  830 - moved to 9:15 - Ann to notify   JOINT REPLACEMENT  06/2018   right hip   KNEE CARTILAGE SURGERY Right    TOTAL HIP ARTHROPLASTY Right 07/17/2018   Procedure: RIGHT TOTAL HIP ARTHROPLASTY ANTERIOR APPROACH;  Surgeon: Gaynelle Arabian, MD;  Location: [facility];  Service: Orthopedics;  Laterality: Right;  162mn   Family History  Problem Relation Age of Onset   Heart disease Father    Lung cancer Father        smoker   Heart attack Father    Colon cancer Mother 735  Heart attack Mother    Heart disease Sister    Hypertension Brother    Heart attack Brother    Leukemia Brother    Obesity Daughter    Anxiety disorder Daughter    Hypertension Brother    Social History   Socioeconomic History   Marital status: Significant Other    Spouse name: GCarlton Adam  Number of children: 1   Years of education: 12   Highest education level: Some college, no degree  Occupational History   Occupation: Retired     Comment: [employer]  Tobacco Use   Smoking status: Former    Types: Cigarettes    Quit date: 08/14/2007    Years since quitting: 14.5   Smokeless tobacco: Never  Vaping Use   Vaping Use: Never used  Substance and Sexual Activity   Alcohol use: No   Drug use: No   Sexual activity: Yes    Birth control/protection: Post-menopausal  Other Topics Concern   Not on file  Social History Narrative   Retired from [employer] age 81  She has 1 daughter who is 553years old and [place]   She has 2 grandsons age 6763and [id number]   She has a  significant other, Landscape architect.     No pets   She loves crafting and thrift stores   Social Determinants of Health   Financial Resource Strain: Low Risk  (02/17/2022)   Overall Financial Resource Strain (CARDIA)    Difficulty of Paying Living Expenses: Not hard at all  Food Insecurity: No Food Insecurity (02/17/2022)   Hunger Vital Sign    Worried About Running Out of Food in the Last Year: Never true    Ran Out of Food in the Last Year: Never true  Transportation Needs: No Transportation Needs (02/17/2022)   PRAPARE - Hydrologist (Medical): No    Lack of Transportation (Non-Medical): No  Physical Activity: Insufficiently Active (02/17/2022)   Exercise Vital Sign    Days of Exercise per Week: 7 days    Minutes of Exercise per Session: 10 min  Stress: No Stress Concern Present (02/17/2022)   [place]    Feeling of Stress : Not at all  Social Connections: [place] (02/17/2022)   Social Connection and Isolation Panel [NHANES]    Frequency of Communication with Friends and Family: More than three times a week    Frequency of Social Gatherings with Friends and Family: Twice a week    Attends Religious Services: More than 4 times per year    Active Member of [employer] or Organizations: Yes    Attends Music therapist: More than 4  times per year    Marital Status: Living with partner    Tobacco Counseling Counseling given: Not Answered   Clinical Intake:  Pre-visit preparation completed: Yes  Pain : No/denies pain     BMI - recorded: 26.57 Nutritional Status: BMI 25 -29 Overweight Nutritional Risks: None Diabetes: No  How often do you need to have someone help you when you read instructions, pamphlets, or other written materials from your doctor or pharmacy?: 1 - Never  Diabetic? no  Interpreter Needed?: No  Information entered by :: Jazzlin Clements, LPN   Activities of Daily Living    02/17/2022    9:04 AM 12/16/2021    8:00 PM  In your present state of health, do you have any difficulty performing the following activities:  Hearing? 0 0  Vision? 0 0  Difficulty concentrating or making decisions? 0 0  Walking or climbing stairs? 0 0  Dressing or bathing? 0 0  Doing errands, shopping? 0 0  Preparing Food and eating ? N   Using the Toilet? N   In the past six months, have you accidently leaked urine? N   Do you have problems with loss of bowel control? N   Managing your Medications? N   Managing your Finances? N   Housekeeping or managing your Housekeeping? N     Patient Care Team: [name], DO as PCP - General (Family Medicine) Rogene Houston, MD as Consulting Physician (Gastroenterology) Gaynelle Arabian, MD as Consulting Physician (Orthopedic Surgery) Ilean China, RN as Registered Nurse Corliss Parish, MD as Consulting Physician (Nephrology) Starlyn Skeans as Physician Assistant (Dermatology) Harlen Labs, MD as Referring Physician (Optometry)  Indicate any recent Medical Services you may have received from other than [facility] providers in the past year (date may be approximate).     Assessment:   This is a routine wellness examination for Angela Parrish.  Hearing/Vision screen Hearing Screening - Comments:: Denies hearing difficulties   Vision Screening -  Comments:: Denies vision difficulties - up to date with routine [facility] exams with [facility]  Dietary issues and exercise activities discussed: Current Exercise Habits: The patient does not participate in regular exercise at present, Exercise limited by: respiratory conditions(s);orthopedic condition(s)   Goals Addressed             This Visit's Progress    DIET - INCREASE WATER INTAKE   On track    Try to drink 6-8 glasses of water daily.     COMPLETED: Exercise 150 min/wk Moderate Activity       After hip surgery increase activity level slowly. Aim for at least 150 minutes a week.       Depression Screen    02/17/2022    9:03 AM 01/25/2022   11:21 AM 12/30/2021    9:19 AM 08/31/2021    9:49 AM 08/08/2021    9:02 AM 03/03/2021    8:20 AM 02/28/2021    8:56 AM  PHQ 2/9 Scores  PHQ - 2 Score 0 0 0 0 0 0 0    Fall Risk    02/17/2022    8:59 AM 01/25/2022   11:21 AM 12/30/2021    9:19 AM 08/31/2021    9:49 AM 08/08/2021    9:02 AM  Fall Risk   Falls in the past year? 0 0 0 0 0  Number falls in past yr: 0      Injury with Fall? 0      Risk for fall due to : No Fall Risks      Follow up Falls prevention discussed        FALL RISK PREVENTION PERTAINING TO THE HOME:  Any stairs in or around the home? Yes  If so, are there any without handrails? No  Home free of loose throw rugs in walkways, pet beds, electrical cords, etc? Yes  Adequate lighting in your home to reduce risk of falls? Yes   ASSISTIVE DEVICES UTILIZED TO PREVENT FALLS:  Life alert? No  Use of a cane, walker or w/c? No  Grab bars in the bathroom? No  Shower chair or bench in shower? No  Elevated toilet seat or a handicapped toilet? No   TIMED UP AND GO:  Was the test performed? No . Telephonic visit  Cognitive Function:    05/17/2018    9:41 AM 11/22/2016    8:43 AM  MMSE - Mini Mental State Exam  Orientation to time 5 5  Orientation to Place 5 5  Registration 3 3  Attention/ Calculation  5 5  Recall 2 3  Language- name 2 objects 2 2  Language- repeat 1 1  Language- follow 3 step command 3 3  Language- read & follow direction 1 1  Write a sentence 1 1  Copy design 1 1  Total score 29 30        02/17/2022    9:13 AM 03/03/2021    8:31 AM 05/19/2019    8:36 AM  6CIT Screen  What Year? 0 points 0 points 0 points  What month? 0 points 0 points 0 points  What time? 0 points 0 points 0 points  Count back from 20 0 points 0 points 0 points  Months in reverse 0 points 0 points 0 points  Repeat phrase 0 points 0 points 2 points  Total Score 0 points 0 points 2 points    Immunizations Immunization History  Administered Date(s) Administered   Fluad Quad(high Dose 65+)  05/05/2019, 05/26/2020, 05/23/2021   Influenza, High Dose Seasonal PF 07/11/2016, 06/13/2017, 05/17/2018   Moderna Sars-Covid-2 Vaccination 10/21/2019, 11/18/2019, 07/06/2020, 03/01/2021   PFIZER Comirnaty(Gray Top)Covid-19 Tri-Sucrose Vaccine 07/05/2021   Pneumococcal Conjugate-13 06/20/2017   Pneumococcal Polysaccharide-23 06/25/2014   Tdap 11/22/2016   Zoster Recombinat (Shingrix) 08/31/2021   Zoster, Live 09/19/2008    TDAP status: Up to date  Flu Vaccine status: Up to date  Pneumococcal vaccine status: Up to date  Covid-19 vaccine status: Completed vaccines  Qualifies for Shingles Vaccine? Yes   Zostavax completed Yes   Shingrix Completed?: No.    Education has been provided regarding the importance of this vaccine. Patient has been advised to call insurance company to determine out of pocket expense if they have not yet received this vaccine. Advised may also receive vaccine at local pharmacy or Health Dept. Verbalized acceptance and understanding.  Screening Tests Health Maintenance  Topic Date Due   DEXA SCAN  05/26/2021   COVID-19 Vaccine (6 - Booster for Moderna series) 08/30/2021   Zoster Vaccines- Shingrix (2 of 2) 10/26/2021   INFLUENZA VACCINE  03/21/2022   MAMMOGRAM  05/17/2022    TETANUS/TDAP  11/23/2026   Pneumonia Vaccine 36+ Years old  Completed   HPV VACCINES  Aged Out    Health Maintenance  Health Maintenance Due  Topic Date Due   DEXA SCAN  05/26/2021   COVID-19 Vaccine (6 - Booster for Moderna series) 08/30/2021   Zoster Vaccines- Shingrix (2 of 2) 10/26/2021    Colorectal cancer screening: No longer required.   Mammogram status: Completed 05/17/2021. Repeat every year  Bone Density status: Completed 05/26/2020. Results reflect: Bone density results: OSTEOPOROSIS. Repeat every 2 years.  Lung Cancer Screening: (Low Dose CT Chest recommended if Age 10-80 years, 30 pack-year currently smoking OR have quit w/in 15years.) does not qualify.   Additional Screening:  Hepatitis C Screening: does not qualify  Vision Screening: Recommended annual ophthalmology exams for Angela detection of glaucoma and other disorders of the eye. Is the patient up to date with their annual eye exam?  Yes  Who is the provider or what is the name of the office in which the patient attends annual eye exams? [place] If pt is not established with a provider, would they like to be referred to a provider to establish care? No .   Dental Screening: Recommended annual dental exams for proper oral hygiene  Community Resource Referral / Chronic Care Management: CRR required this visit?  No   CCM required this visit?  No      Plan:     I have personally reviewed and noted the following in the patient's chart:   Medical and social history Use of alcohol, tobacco or illicit drugs  Current medications and supplements including opioid prescriptions.  Functional ability and status Nutritional status Physical activity Advanced directives List of other physicians Hospitalizations, surgeries, and ER visits in previous 12 months Vitals Screenings to include cognitive, depression, and falls Referrals and appointments  In addition, I have reviewed and discussed  with patient certain preventive protocols, quality metrics, and best practice recommendations. A written personalized care plan for preventive services as well as general preventive health recommendations were provided to patient.     Sandrea Hammond, LPN   3/61/4431   Nurse Notes: None

## 2022-02-23 ENCOUNTER — Ambulatory Visit (INDEPENDENT_AMBULATORY_CARE_PROVIDER_SITE_OTHER): Payer: Medicare HMO | Admitting: Emergency Medicine

## 2022-02-23 DIAGNOSIS — Z23 Encounter for immunization: Secondary | ICD-10-CM

## 2022-02-27 ENCOUNTER — Ambulatory Visit: Payer: Self-pay | Admitting: *Deleted

## 2022-02-27 NOTE — Chronic Care Management (AMB) (Signed)
  Chronic Care Management   Note  02/27/2022 Name: Angela Parrish MRN: [id number] DOB: 06-Jan-1941   Patient has not recently engaged with the Chronic Care Management RN Care Manager. Removing RN Care Manager from Care Team and closing [place]. If patient is currently engaged with another CCM team member I will forward this encounter to inform them of my case closure. Patient may be eligible for re-engagement with RN Care Manager in the future if necessary and can discuss this with their PCP.  Chong Sicilian, BSN, RN-BC Embedded Chronic Care Manager [facility] [place] [facility] Direct Dial: [phone]

## 2022-02-28 ENCOUNTER — Ambulatory Visit: Payer: Medicare HMO | Admitting: Family Medicine

## 2022-02-28 ENCOUNTER — Other Ambulatory Visit: Payer: Self-pay | Admitting: Family Medicine

## 2022-02-28 DIAGNOSIS — E7841 Elevated Lipoprotein(a): Secondary | ICD-10-CM

## 2022-03-06 ENCOUNTER — Ambulatory Visit: Payer: Medicare HMO

## 2022-03-06 ENCOUNTER — Encounter (INDEPENDENT_AMBULATORY_CARE_PROVIDER_SITE_OTHER): Payer: Medicare HMO | Admitting: Ophthalmology

## 2022-03-06 DIAGNOSIS — H34212 Partial retinal artery occlusion, left eye: Secondary | ICD-10-CM

## 2022-03-06 DIAGNOSIS — H43813 Vitreous degeneration, bilateral: Secondary | ICD-10-CM

## 2022-04-13 ENCOUNTER — Other Ambulatory Visit: Payer: Self-pay | Admitting: Family Medicine

## 2022-04-13 DIAGNOSIS — Z1231 Encounter for screening mammogram for malignant neoplasm of breast: Secondary | ICD-10-CM

## 2022-05-08 DIAGNOSIS — H5203 Hypermetropia, bilateral: Secondary | ICD-10-CM | POA: Diagnosis not present

## 2022-05-18 ENCOUNTER — Ambulatory Visit
Admission: RE | Admit: 2022-05-18 | Discharge: 2022-05-18 | Disposition: A | Discharge status: Home / Self Care | Payer: Medicare HMO | Source: Ambulatory Visit | Attending: Family Medicine | Admitting: Family Medicine

## 2022-05-18 DIAGNOSIS — Z1231 Encounter for screening mammogram for malignant neoplasm of breast: Secondary | ICD-10-CM | POA: Diagnosis not present

## 2022-06-01 ENCOUNTER — Other Ambulatory Visit: Payer: Self-pay | Admitting: Family Medicine

## 2022-06-01 MED ORDER — DENOSUMAB 60 MG/ML ~~LOC~~ SOSY: 60.0000 mg | PREFILLED_SYRINGE | Freq: Once | SUBCUTANEOUS | 0 refills | Status: AC

## 2022-06-05 NOTE — Progress Notes (Signed)
[facility] called to confirm that they are in the process of mailing [place] patients Prolia. Should have it by Thursday 06/08/22.

## 2022-06-08 ENCOUNTER — Telehealth: Payer: Self-pay | Admitting: Family Medicine

## 2022-06-08 NOTE — Telephone Encounter (Signed)
We have received prolia for patient. Left message for patient to call to schedule appointment. Patient can receive Prolia after 10/19

## 2022-06-15 ENCOUNTER — Ambulatory Visit (INDEPENDENT_AMBULATORY_CARE_PROVIDER_SITE_OTHER): Payer: Medicare HMO | Admitting: *Deleted

## 2022-06-15 DIAGNOSIS — M810 Age-related osteoporosis without current pathological fracture: Secondary | ICD-10-CM | POA: Diagnosis not present

## 2022-06-15 MED ORDER — DENOSUMAB 60 MG/ML ~~LOC~~ SOSY
60.0000 mg | PREFILLED_SYRINGE | Freq: Once | SUBCUTANEOUS | Status: AC
  Administered 2022-06-15: 60 mg via SUBCUTANEOUS

## 2022-07-03 ENCOUNTER — Other Ambulatory Visit: Payer: Self-pay | Admitting: Family Medicine

## 2022-07-03 ENCOUNTER — Encounter: Payer: Self-pay | Admitting: Family Medicine

## 2022-07-03 ENCOUNTER — Ambulatory Visit (INDEPENDENT_AMBULATORY_CARE_PROVIDER_SITE_OTHER): Payer: Medicare HMO | Admitting: Family Medicine

## 2022-07-03 VITALS — BP 130/72 | HR 80 | Temp 98.1°F | Ht 63.0 in | Wt 144.4 lb

## 2022-07-03 DIAGNOSIS — N184 Chronic kidney disease, stage 4 (severe): Secondary | ICD-10-CM | POA: Diagnosis not present

## 2022-07-03 DIAGNOSIS — J410 Simple chronic bronchitis: Secondary | ICD-10-CM | POA: Diagnosis not present

## 2022-07-03 DIAGNOSIS — I708 Atherosclerosis of other arteries: Secondary | ICD-10-CM

## 2022-07-03 DIAGNOSIS — Z6379 Other stressful life events affecting family and household: Secondary | ICD-10-CM | POA: Diagnosis not present

## 2022-07-03 DIAGNOSIS — E7841 Elevated Lipoprotein(a): Secondary | ICD-10-CM

## 2022-07-03 DIAGNOSIS — H3509 Other intraretinal microvascular abnormalities: Secondary | ICD-10-CM

## 2022-07-03 DIAGNOSIS — Z23 Encounter for immunization: Secondary | ICD-10-CM | POA: Diagnosis not present

## 2022-07-03 DIAGNOSIS — G479 Sleep disorder, unspecified: Secondary | ICD-10-CM

## 2022-07-03 MED ORDER — TRAZODONE HCL 50 MG PO TABS: 25.0000 mg | ORAL_TABLET | Freq: Every evening | ORAL | 3 refills | Status: DC | PRN

## 2022-07-03 MED ORDER — ATORVASTATIN CALCIUM 10 MG PO TABS: 10.0000 mg | ORAL_TABLET | Freq: Every day | ORAL | 3 refills | Status: DC

## 2022-07-03 NOTE — Patient Instructions (Signed)
Insomnia Insomnia is a sleep disorder that makes it difficult to fall asleep or stay asleep. Insomnia can cause fatigue, low energy, difficulty concentrating, mood swings, and poor performance at work or school. There are three different ways to classify insomnia: Difficulty falling asleep. Difficulty staying asleep. Waking up too early in the morning. Any type of insomnia can be long-term (chronic) or short-term (acute). Both are common. Short-term insomnia usually lasts for 3 months or less. Chronic insomnia occurs at least three times a week for longer than 3 months. What are the causes? Insomnia may be caused by another condition, situation, or substance, such as: Having certain mental health conditions, such as anxiety and depression. Using caffeine, alcohol, tobacco, or drugs. Having gastrointestinal conditions, such as gastroesophageal reflux disease (GERD). Having certain medical conditions. These include: Asthma. Alzheimer's disease. Stroke. Chronic pain. An overactive thyroid gland (hyperthyroidism). Other sleep disorders, such as restless legs syndrome and sleep apnea. Menopause. Sometimes, the cause of insomnia may not be known. What increases the risk? Risk factors for insomnia include: Gender. Females are affected more often than males. Age. Insomnia is more common as people get older. Stress and certain medical and mental health conditions. Lack of exercise. Having an irregular work schedule. This may include working night shifts and traveling between different time zones. What are the signs or symptoms? If you have insomnia, the main symptom is having trouble falling asleep or having trouble staying asleep. This may lead to other symptoms, such as: Feeling tired or having low energy. Feeling nervous about going to sleep. Not feeling rested in the morning. Having trouble concentrating. Feeling irritable, anxious, or depressed. How is this diagnosed? This condition  may be diagnosed based on: Your symptoms and medical history. Your health care provider may ask about: Your sleep habits. Any medical conditions you have. Your mental health. A physical exam. How is this treated? Treatment for insomnia depends on the cause. Treatment may focus on treating an underlying condition that is causing the insomnia. Treatment may also include: Medicines to help you sleep. Counseling or therapy. Lifestyle adjustments to help you sleep better. Follow these instructions at home: Eating and drinking  Limit or avoid alcohol, caffeinated beverages, and products that contain nicotine and tobacco, especially close to bedtime. These can disrupt your sleep. Do not eat a large meal or eat spicy foods right before bedtime. This can lead to digestive discomfort that can make it hard for you to sleep. Sleep habits  Keep a sleep diary to help you and your health care provider figure out what could be causing your insomnia. Write down: When you sleep. When you wake up during the night. How well you sleep and how rested you feel the next day. Any side effects of medicines you are taking. What you eat and drink. Make your bedroom a dark, comfortable place where it is easy to fall asleep. Put up shades or blackout curtains to block light from outside. Use a white noise machine to block noise. Keep the temperature cool. Limit screen use before bedtime. This includes: Not watching TV. Not using your smartphone, tablet, or computer. Stick to a routine that includes going to bed and waking up at the same times every day and night. This can help you fall asleep faster. Consider making a quiet activity, such as reading, part of your nighttime routine. Try to avoid taking naps during the day so that you sleep better at night. Get out of bed if you are still awake after   15 minutes of trying to sleep. Keep the lights down, but try reading or doing a quiet activity. When you feel  sleepy, go back to bed. General instructions Take over-the-counter and prescription medicines only as told by your health care provider. Exercise regularly as told by your health care provider. However, avoid exercising in the hours right before bedtime. Use relaxation techniques to manage stress. Ask your health care provider to suggest some techniques that may work well for you. These may include: Breathing exercises. Routines to release muscle tension. Visualizing peaceful scenes. Make sure that you drive carefully. Do not drive if you feel very sleepy. Keep all follow-up visits. This is important. Contact a health care provider if: You are tired throughout the day. You have trouble in your daily routine due to sleepiness. You continue to have sleep problems, or your sleep problems get worse. Get help right away if: You have thoughts about hurting yourself or someone else. Get help right away if you feel like you may hurt yourself or others, or have thoughts about taking your own life. Go to your nearest emergency room or: Call 911. Call the National Suicide Prevention Lifeline at 1-800-273-8255 or 988. This is open 24 hours a day. Text the Crisis Text Line at 741741. Summary Insomnia is a sleep disorder that makes it difficult to fall asleep or stay asleep. Insomnia can be long-term (chronic) or short-term (acute). Treatment for insomnia depends on the cause. Treatment may focus on treating an underlying condition that is causing the insomnia. Keep a sleep diary to help you and your health care provider figure out what could be causing your insomnia. This information is not intended to replace advice given to you by your health care provider. Make sure you discuss any questions you have with your health care provider. Document Revised: 07/18/2021 Document Reviewed: 07/18/2021 Elsevier Patient Education  2023 [employer]  

## 2022-07-03 NOTE — Progress Notes (Signed)
Subjective: CC: 103-monthfollow-up PCP: GJanora Norlander DO HUKG:URKYHHMAHSA HANSERis a 81y.o. female presenting to clinic today for:  1.  Follow-up retinal plaque Patient had recent evaluation with her retinal specialist and was told that the retinal plaque was about 75% resolved.  She is to continue baby aspirin and follow-up with her regular optometrist.  She notes that her optometrist also noted that she has improving vision and she is thrilled about this.  2.  CKD 4 Patient continues to follow-up with her nephrologist and has an appointment coming up at the end of the month.  No reports of decreased urine output, chest pain or shortness of breath.  3.  Sleep difficulties She reports sleep difficulties.  She often goes to sleep around 9 PM but wakes up probably around 12 AM and cannot go back to sleep until almost morning time.  She is active during the day and spends most days with her brother, who is suffering from leukemia.  During the afternoon she is active.  She does drink about 1-1/2 bottles of [facility].   ROS: Per HPI  No Known Allergies Past Medical History:  Diagnosis Date   Abnormal Pap smear    Previous colposcopy   [place] pulmonary embolism ([facility] 09/21/2018   Arthritis    hip    Asthma    childhood     Basal cell carcinoma 07/08/2001   Right Forehead (curet, excision)   Breast cancer [place] [place]   Status post left lumpectomy   Closed fracture of third toe of right foot 03/13/2017   COPD (chronic obstructive pulmonary disease) (HCC)    Dyspnea    on exertion ; improved with use Spiriva    Headache    occ   Hyperlipidemia    Personal history of radiation therapy 2000   [place] leg DVT ([facility] 09/22/2018    Current Outpatient Medications:    acetaminophen (TYLENOL) 325 MG tablet, Take 2 tablets (650 mg total) by mouth every 6 (six) hours as needed for mild pain (or Fever >/= 101)., Disp: , Rfl:    albuterol (PROVENTIL) (2.5 MG/3ML) 0.083% nebulizer  solution, Take 3 mLs (2.5 mg total) by nebulization every 6 (six) hours as needed for wheezing or shortness of breath., Disp: 360 mL, Rfl: 12   albuterol (VENTOLIN HFA) 108 (90 Base) MCG/ACT inhaler, INHALE 2 PUFFS INTO THE LUNGS EVERY 6 HOURS AS NEEDED FOR WHEEZING OR SHORTNESS OF BREATH., Disp: 18 each, Rfl: 0   aspirin EC 81 MG EC tablet, Take 1 tablet (81 mg total) by mouth daily. Swallow whole., Disp: 30 tablet, Rfl: 11   atorvastatin (LIPITOR) 10 MG tablet, Take 1 tablet (10 mg total) by mouth daily. (NEEDS TO BE SEEN BEFORE NEXT REFILL), Disp: 90 tablet, Rfl: 0   loratadine (CLARITIN) 10 MG tablet, Take 10 mg by mouth daily., Disp: , Rfl:    traZODone (DESYREL) 50 MG tablet, Take 0.5-1 tablets (25-50 mg total) by mouth at bedtime as needed for sleep., Disp: 30 tablet, Rfl: 3   TRELEGY ELLIPTA 200-62.5-25 MCG/ACT AEPB, INHALE 1 PUFF EVERY DAY, Disp: 180 each, Rfl: 1  Current Facility-Administered Medications:    denosumab (PROLIA) injection 60 mg, 60 mg, Subcutaneous, Q6 months, Burnice Oestreicher M, DO, 60 mg at 12/06/21 1353 Social History   Socioeconomic History   Marital status: Significant Other    Spouse name: GCarlton Adam  Number of children: 1   Years of education: 128  Highest education level: Some college, no degree  Occupational History   Occupation: Retired    Comment: [employer]  Tobacco Use   Smoking status: Former    Types: Cigarettes    Quit date: 08/14/2007    Years since quitting: 14.8   Smokeless tobacco: Never  Vaping Use   Vaping Use: Never used  Substance and Sexual Activity   Alcohol use: No   Drug use: No   Sexual activity: Yes    Birth control/protection: Post-menopausal  Other Topics Concern   Not on file  Social History Narrative   Retired from [employer] age 6   She has 1 daughter who is 106 years old and [place]   She has 2 grandsons age 80 and 47.   She has a significant other, Landscape architect.     No pets   She loves crafting  and thrift stores   Social Determinants of Health   Financial Resource Strain: Low Risk  (02/17/2022)   Overall Financial Resource Strain (CARDIA)    Difficulty of Paying Living Expenses: Not hard at all  Food Insecurity: No Food Insecurity (02/17/2022)   Hunger Vital Sign    Worried About Running Out of Food in the Last Year: Never true    Ran Out of Food in the Last Year: Never true  Transportation Needs: No Transportation Needs (02/17/2022)   PRAPARE - Hydrologist (Medical): No    Lack of Transportation (Non-Medical): No  Physical Activity: Insufficiently Active (02/17/2022)   Exercise Vital Sign    Days of Exercise per Week: 7 days    Minutes of Exercise per Session: 10 min  Stress: No Stress Concern Present (02/17/2022)   [place]    Feeling of Stress : Not at all  Social Connections: [place] (02/17/2022)   Social Connection and Isolation Panel [NHANES]    Frequency of Communication with Friends and Family: More than three times a week    Frequency of Social Gatherings with Friends and Family: Twice a week    Attends Religious Services: More than 4 times per year    Active Member of [employer] or Organizations: Yes    Attends Music therapist: More than 4 times per year    Marital Status: Living with partner  Intimate Partner Violence: Not At Risk (02/17/2022)   Humiliation, Afraid, Rape, and Kick questionnaire    Fear of Current or Ex-Partner: No    Emotionally Abused: No    Physically Abused: No    Sexually Abused: No   Family History  Problem Relation Age of Onset   Heart disease Father    Lung cancer Father        smoker   Heart attack Father    Colon cancer Mother 104   Heart attack Mother    Heart disease Sister    Hypertension Brother    Heart attack Brother    Leukemia Brother    Obesity Daughter    Anxiety disorder Daughter    Hypertension  Brother     Objective: Office vital signs reviewed. BP 130/72   Pulse 80   Temp 98.1 F (36.7 C)   Ht '5\' 3"'$  (1.6 m)   Wt 144 lb 6.4 oz (65.5 kg)   SpO2 98%   BMI 25.58 kg/m   Physical Examination:  General: Awake, alert, well nourished, No acute distress HEENT: PERRLA, EOMI.  Sclera white Cardio: regular  rate and rhythm, S1S2 heard, no murmurs appreciated Pulm: clear to auscultation bilaterally, no wheezes, rhonchi or rales; normal work of breathing on room air Psych: Mood stable, speech normal, affect appropriate    Assessment/ Plan: 81 y.o. female   Sleep difficulties - Plan: traZODone (DESYREL) 50 MG tablet  Stress due to illness of family member  Need for immunization against influenza - Plan: Flu Vaccine QUAD High Dose(Fluad)  Chronic kidney disease, stage 4 (severe) (HCC)  Simple chronic bronchitis ([place])  Retinal artery plaque  Discussed weaning from caffeine.  She is drinking quite a bit of [employer].  I am adding low-dose trazodone.  Influenza vaccination administered  Has follow-up with renal specialist in a couple of weeks so we will defer any labs to them.  She will be due for fasting lipid in 6 months.  Bronchitis is chronic and stable.  Discussed RSV vaccination today.  Discussed risks associated with it including atrial fibrillation.  Renal artery plaque is improving by 75%.  I have scanned in her recent office visit with retinal specialist into the chart.  Continue baby aspirin as directed  Orders Placed This Encounter  Procedures   Flu Vaccine QUAD High Dose(Fluad)   Meds ordered this encounter  Medications   traZODone (DESYREL) 50 MG tablet    Sig: Take 0.5-1 tablets (25-50 mg total) by mouth at bedtime as needed for sleep.    Dispense:  30 tablet    Refill:  [place], [place] [id number]

## 2022-07-17 DIAGNOSIS — N184 Chronic kidney disease, stage 4 (severe): Secondary | ICD-10-CM | POA: Diagnosis not present

## 2022-07-17 DIAGNOSIS — I129 Hypertensive chronic kidney disease with stage 1 through stage 4 chronic kidney disease, or unspecified chronic kidney disease: Secondary | ICD-10-CM | POA: Diagnosis not present

## 2022-08-01 ENCOUNTER — Telehealth: Payer: Medicare HMO

## 2022-08-02 ENCOUNTER — Ambulatory Visit (INDEPENDENT_AMBULATORY_CARE_PROVIDER_SITE_OTHER): Payer: Medicare HMO | Admitting: Nurse Practitioner

## 2022-08-02 ENCOUNTER — Encounter: Payer: Self-pay | Admitting: Nurse Practitioner

## 2022-08-02 VITALS — BP 136/76 | HR 86 | Temp 97.7°F | Ht 63.0 in | Wt 142.0 lb

## 2022-08-02 DIAGNOSIS — J069 Acute upper respiratory infection, unspecified: Secondary | ICD-10-CM | POA: Diagnosis not present

## 2022-08-02 MED ORDER — AMOXICILLIN-POT CLAVULANATE 875-125 MG PO TABS: 1.0000 | ORAL_TABLET | Freq: Two times a day (BID) | ORAL | 0 refills | Status: DC

## 2022-08-02 MED ORDER — PREDNISONE 10 MG PO TABS: 10.0000 mg | ORAL_TABLET | Freq: Every day | ORAL | 0 refills | Status: DC

## 2022-08-02 MED ORDER — BENZONATATE 100 MG PO CAPS: 100.0000 mg | ORAL_CAPSULE | Freq: Three times a day (TID) | ORAL | 0 refills | Status: DC | PRN

## 2022-08-02 MED ORDER — GUAIFENESIN ER 600 MG PO TB12: 600.0000 mg | ORAL_TABLET | Freq: Two times a day (BID) | ORAL | 0 refills | Status: DC

## 2022-08-02 NOTE — Patient Instructions (Signed)

## 2022-08-02 NOTE — Progress Notes (Unsigned)
   Acute Office Visit  Subjective:     Patient ID: Angela Parrish, female    DOB: 11/30/1940, 81 y.o.   MRN: [id number]  Chief Complaint  Patient presents with   Cough    HPI Patient is in today for ***  ROS      Objective:    BP 136/76   Pulse 86   Temp 97.7 F (36.5 C)   Ht '5\' 3"'$  (1.6 m)   Wt 142 lb (64.4 kg)   SpO2 95%   BMI 25.15 kg/m  {Vitals History (Optional):23777}  Physical Exam  No results found for any visits on 08/02/22.      Assessment & Plan:   Problem List Items Addressed This Visit   None   No orders of the defined types were placed in this encounter.   No follow-ups on file.  Ivy Lynn, NP

## 2022-08-28 IMAGING — CT CT HEAD W/O CM
3 of 4 series · 15 of 47 positions shown, 18 images · non-contrast
Comparison: None.

CLINICAL DATA: Neurological deficit, intermittent loss of vision
left eye



[Series 2: head w o · axial · 0.41mm/px · z∈[-38,+92]mm · 9 of 32 slices shown, 12 images]
[im 3/32  brain]
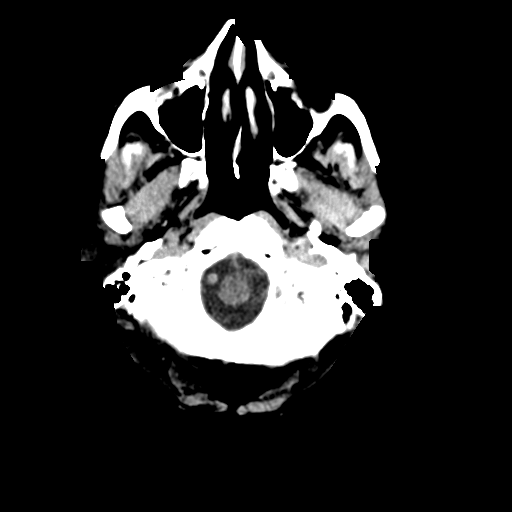
[im 3/32  bone]
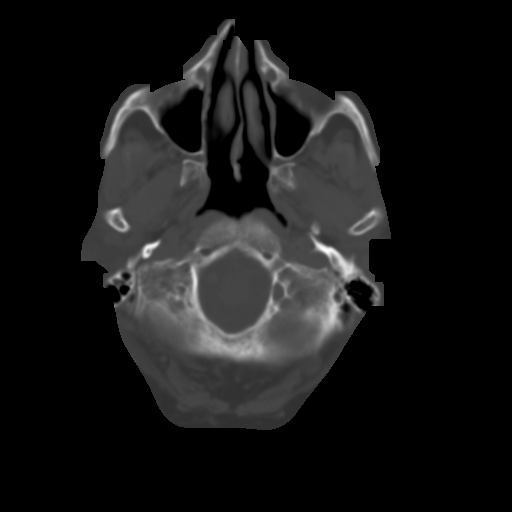
[im 7/32  brain]
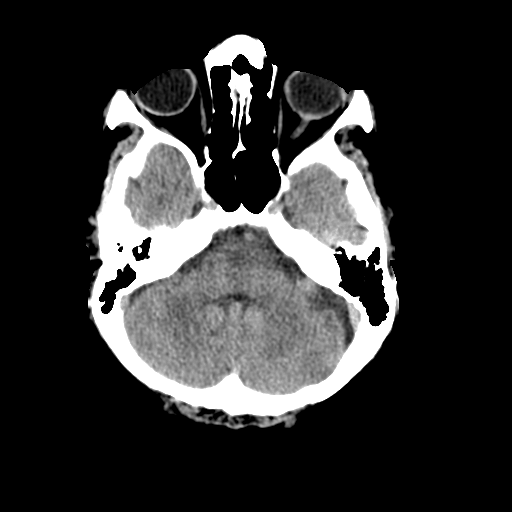
[im 9/32  brain]
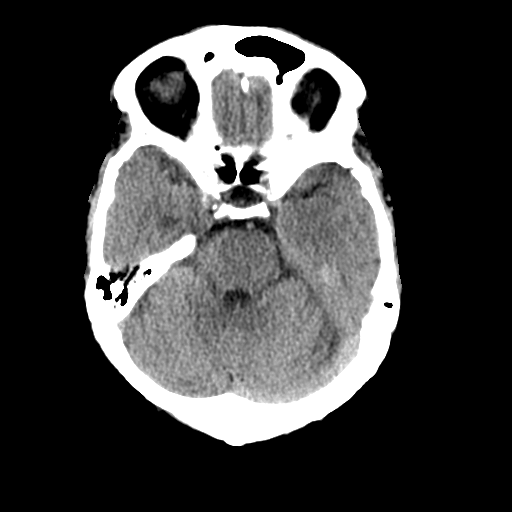
[im 14/32  brain]
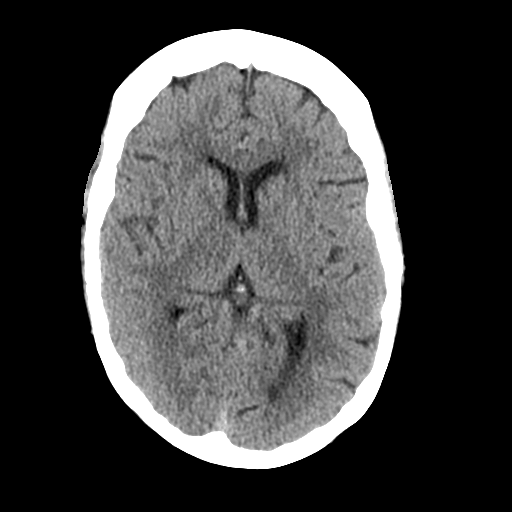
[im 16/32  brain]
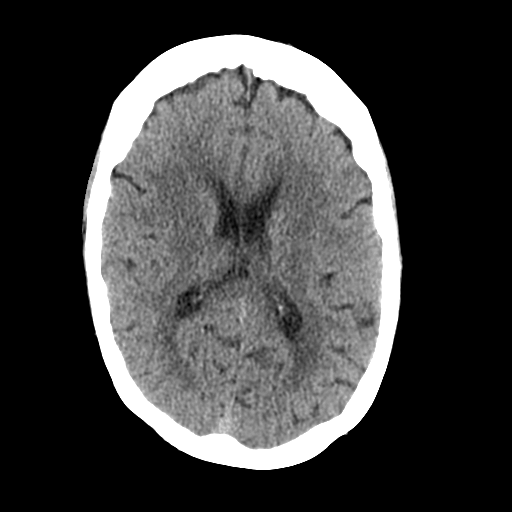
[im 16/32  bone]
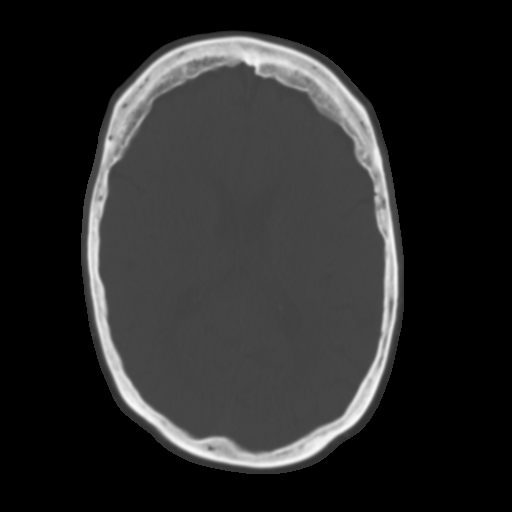
[im 18/32  brain]
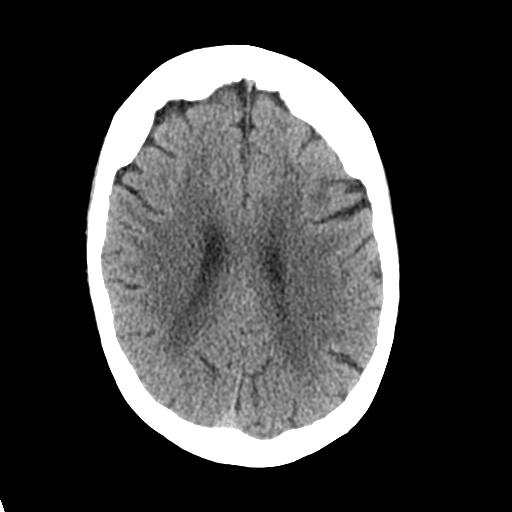
[im 23/32  brain]
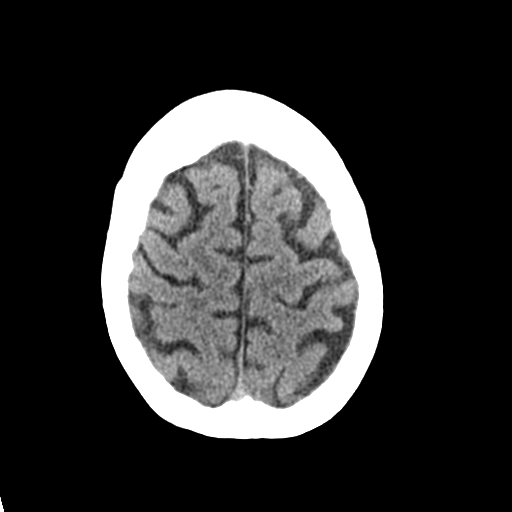
[im 25/32  brain]
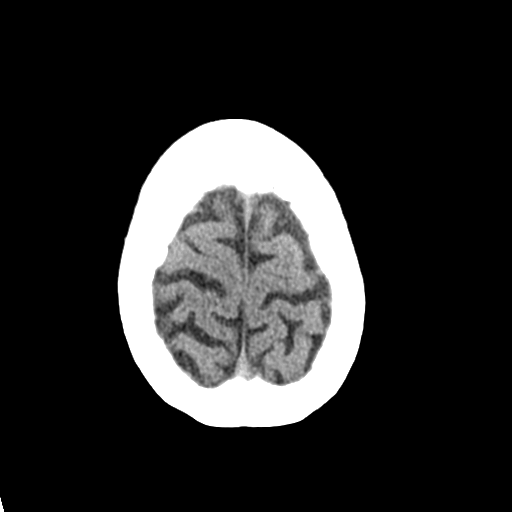
[im 29/32  brain]
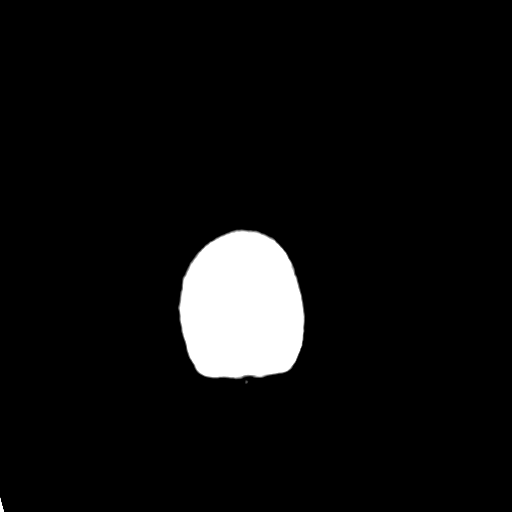
[im 29/32  bone]
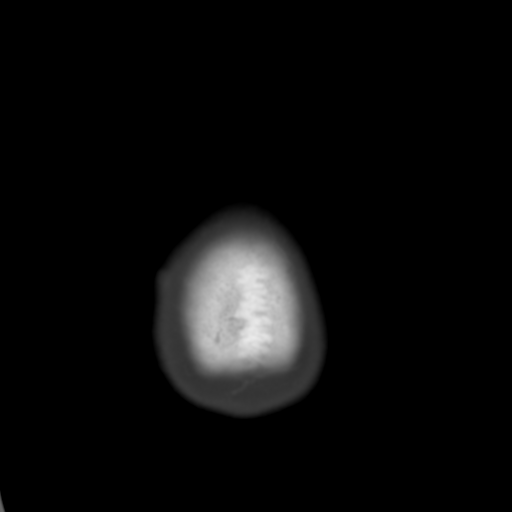

[Series 4: coronal soft · coronal · 0.33mm/px · 3 of 67 slices shown]
[im 23/67  brain]
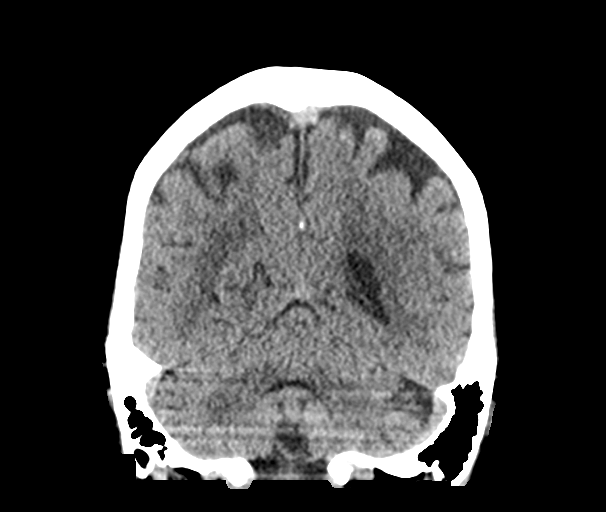
[im 30/67  brain]
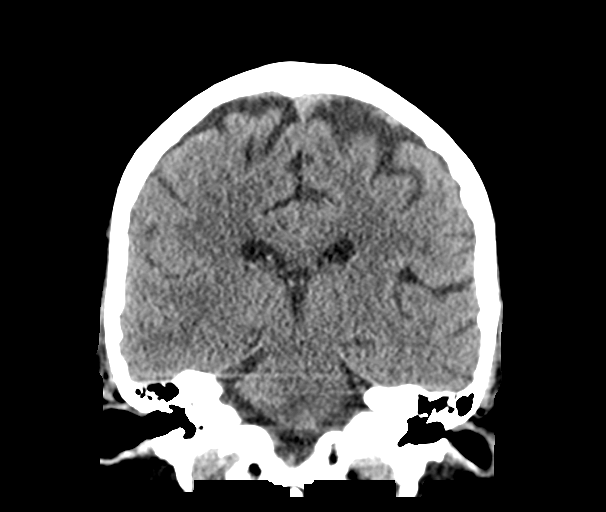
[im 37/67  brain]
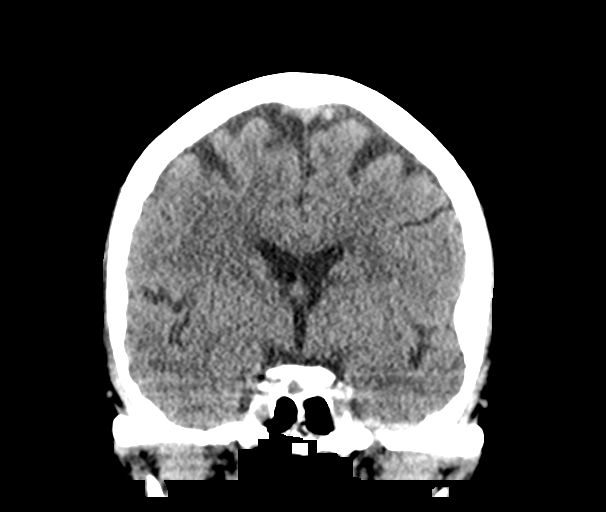

[Series 5: sagittal soft · sagittal · 0.33mm/px · 3 of 67 slices shown]
[im 23/67  brain]
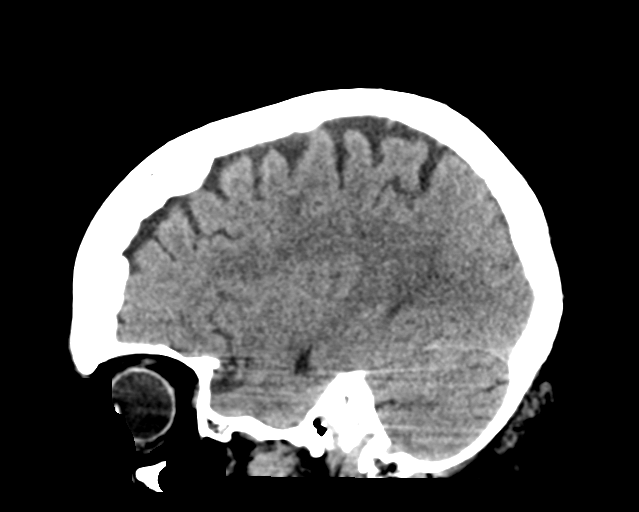
[im 34/67  brain]
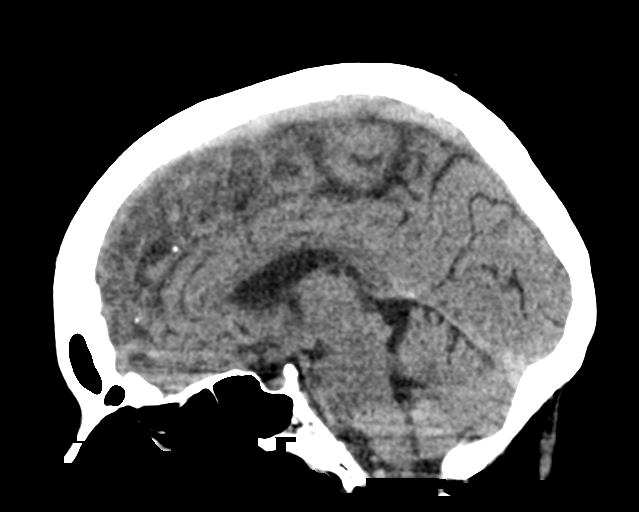
[im 45/67  brain]
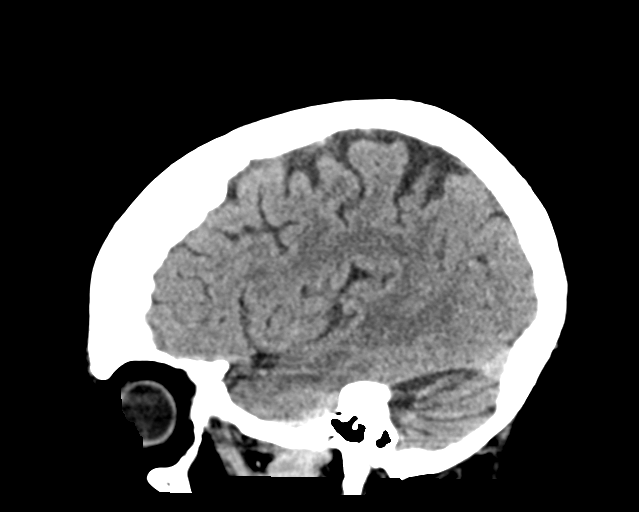

[15 of 47 positions shown; findings below may reference images not displayed]

FINDINGS: Brain: No acute intracranial findings are seen in the noncontrast CT
brain. There are no signs of bleeding. Ventricles are not dilated.
Cortical sulci are prominent.

Vascular: Unremarkable.

Skull: Hyperostosis frontalis interna is seen.

Sinuses/Orbits: Paranasal sinuses are unremarkable. There is linear
increased density in the region of lens in the anterior right optic
globe. This may be residual from previous intervention.

Other: None
IMPRESSION: No acute intracranial findings are seen in noncontrast CT brain.
Atrophy.

## 2022-08-29 IMAGING — US US CAROTID DUPLEX BILAT
1 series · 13 of 24 positions shown · non-contrast
Comparison: None.

CLINICAL DATA: Left eye visual changes

EXAM:
BILATERAL CAROTID DUPLEX ULTRASOUND
TECHNIQUE: Gray scale imaging, color Doppler and duplex ultrasound were
performed of bilateral carotid and vertebral arteries in the neck.

[Series 1: us carotid bilateral · 13 of 70 slices shown]
[im 1/70]
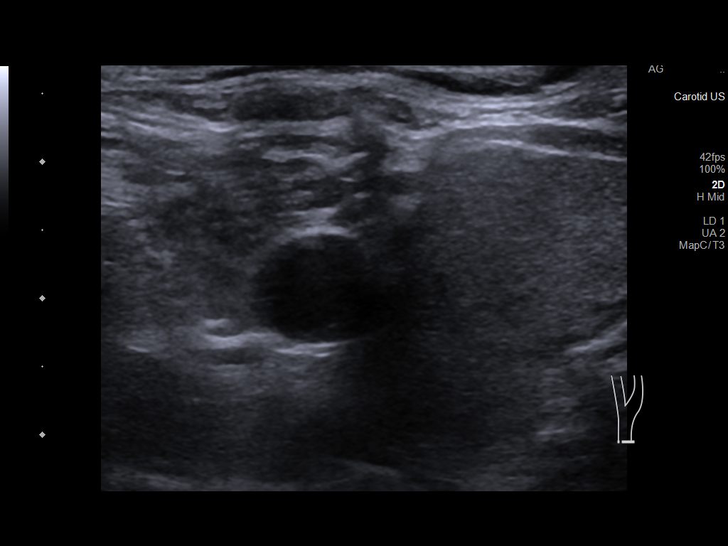
[im 7/70]
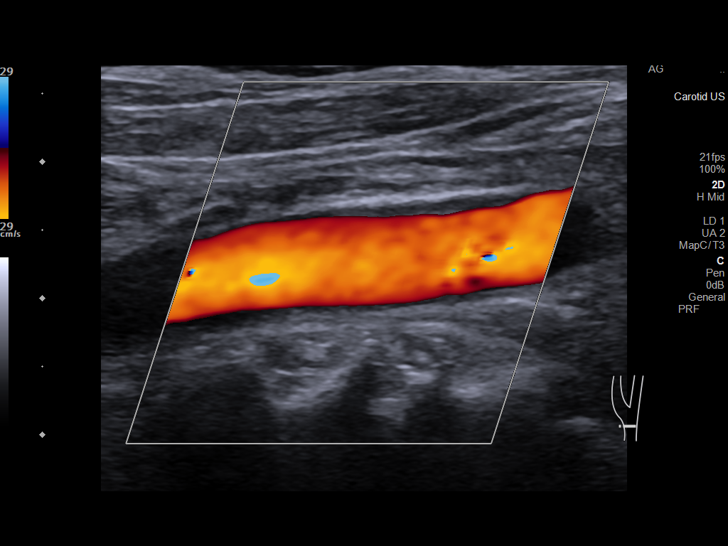
[im 13/70]
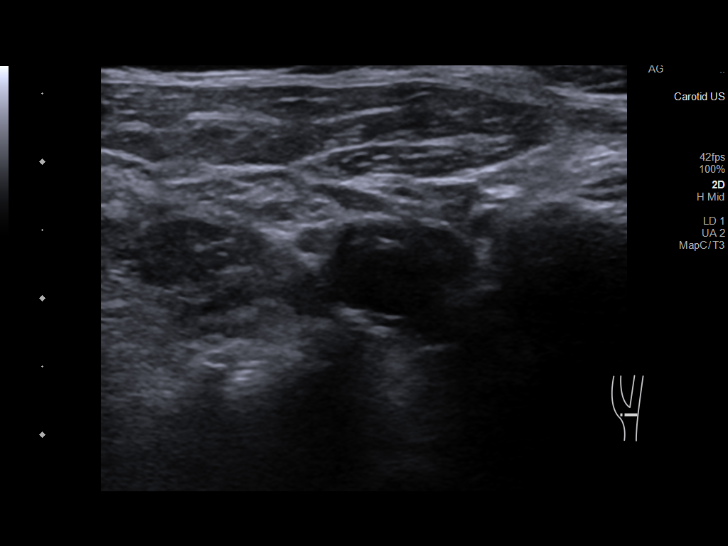
[im 19/70]
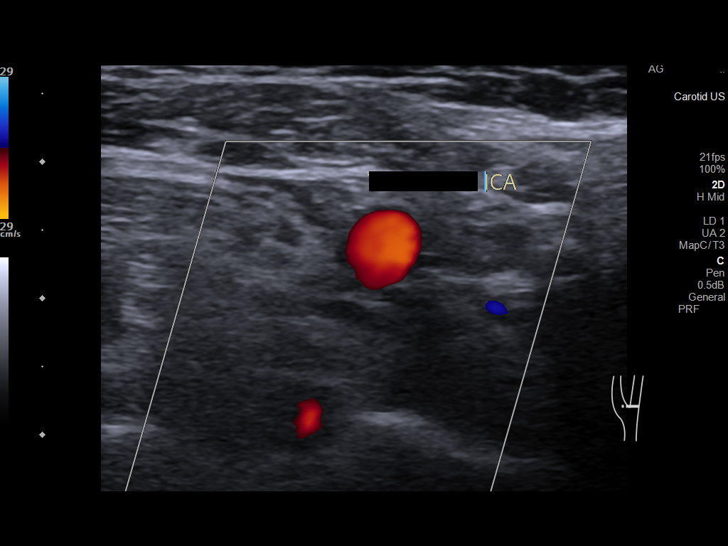
[im 25/70]
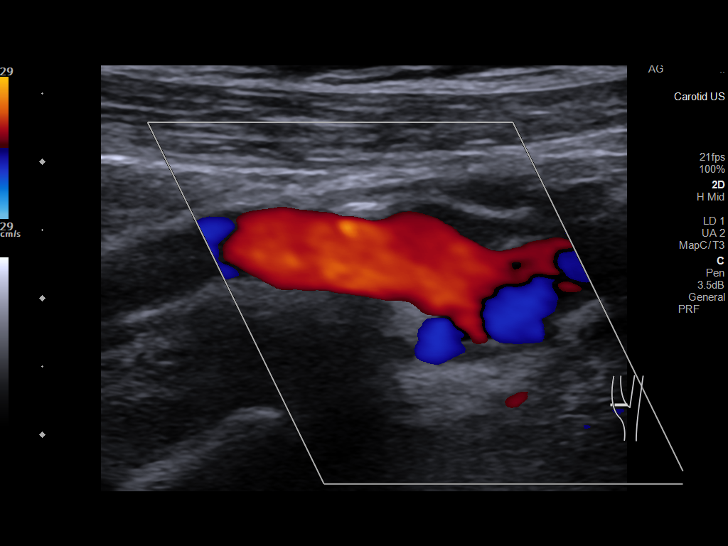
[im 31/70]
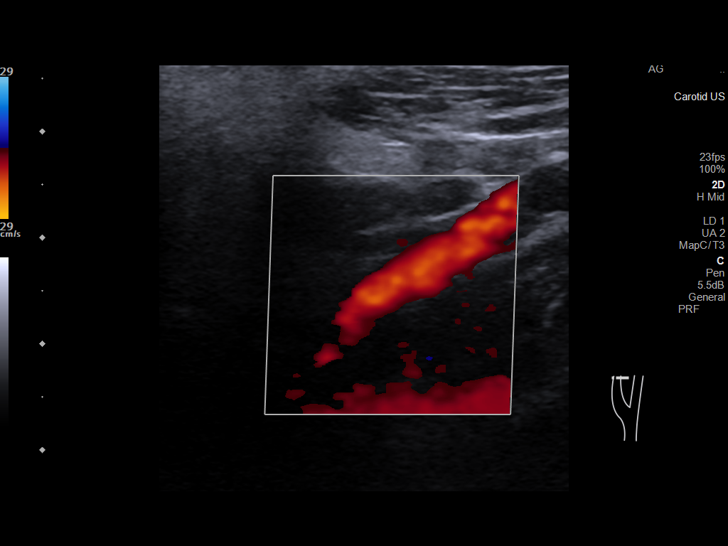
[im 37/70]
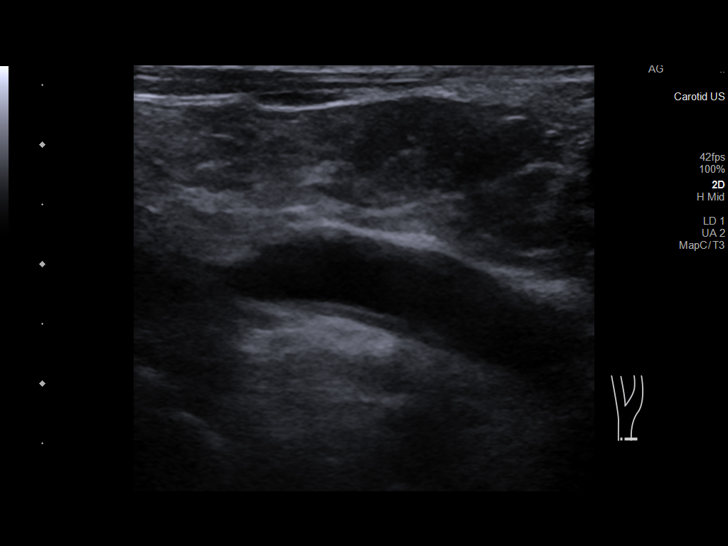
[im 40/70]
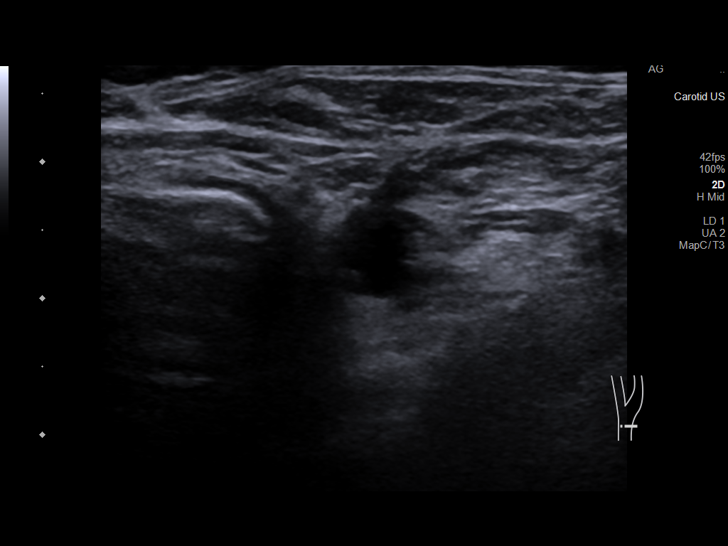
[im 46/70]
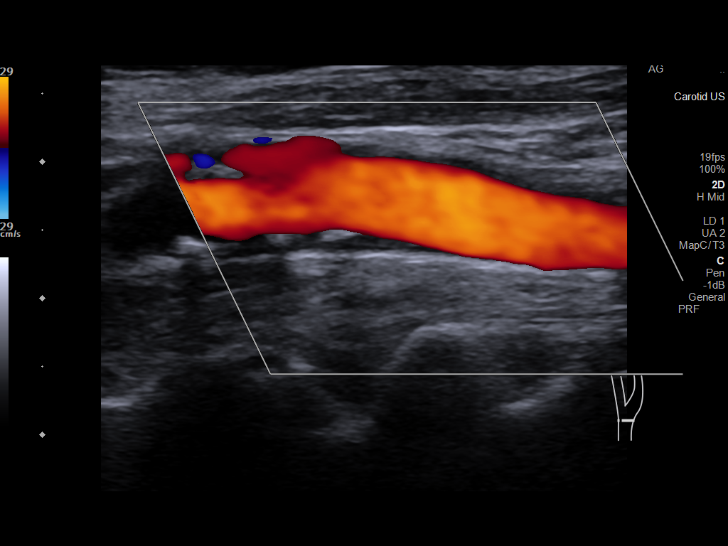
[im 52/70]
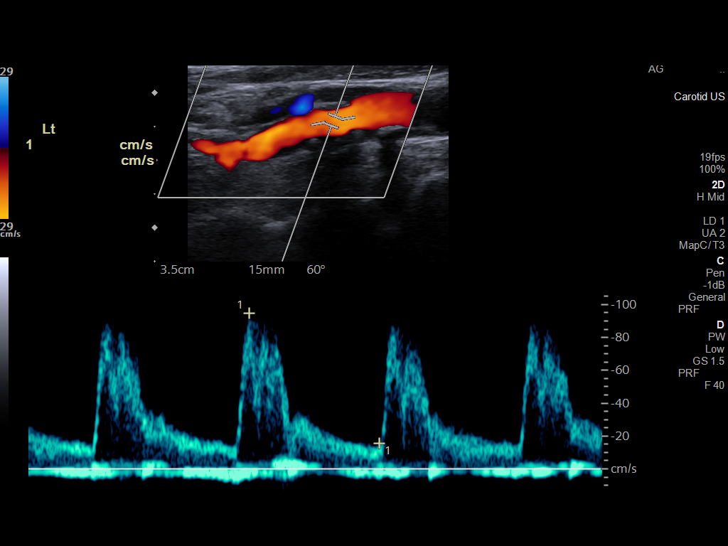
[im 58/70]
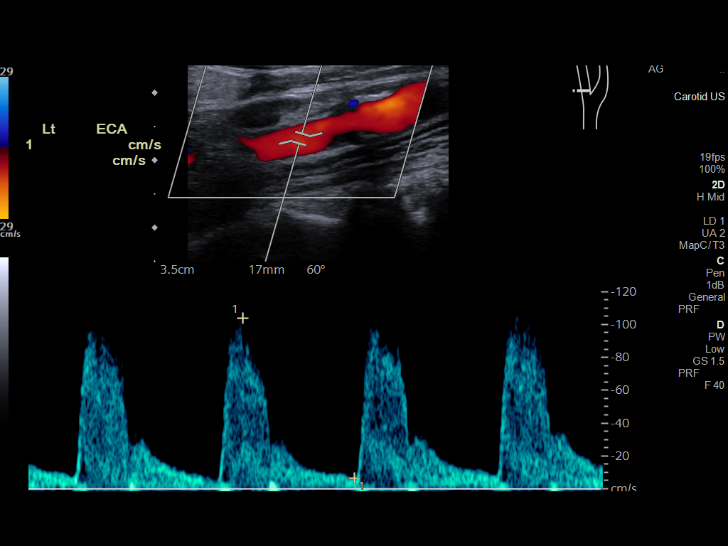
[im 64/70]
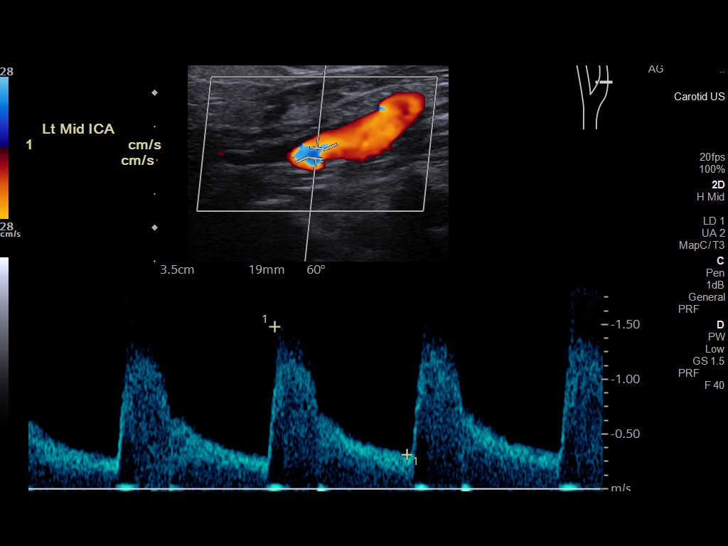
[im 70/70]
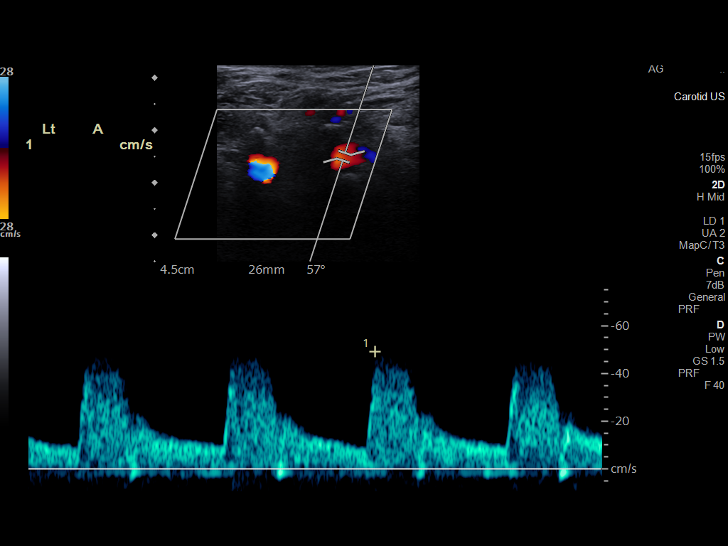

[13 of 24 positions shown; findings below may reference images not displayed]

FINDINGS: Criteria: Quantification of carotid stenosis is based on velocity
parameters that correlate the residual internal carotid diameter
with NASCET-based stenosis levels, using the diameter of the distal
internal carotid lumen as the denominator for stenosis measurement.

The following velocity measurements were obtained:

RIGHT

ICA: 139/30 cm/sec

CCA: 109/21 cm/sec

SYSTOLIC ICA/CCA RATIO:

ECA: 97 cm/sec

LEFT

ICA: 148/31 cm/sec

CCA: 123/20 cm/sec

SYSTOLIC ICA/CCA RATIO:

ECA: 104 cm/sec

RIGHT CAROTID ARTERY: Mild smooth noncalcified plaque through the
common carotid. Eccentric partially calcified plaque in the bulb and
proximal ICA resulting in at least mild stenosis. ICA mildly
tortuous. Normal waveforms and color Doppler signal throughout.

RIGHT VERTEBRAL ARTERY:  Normal flow direction and waveform.

LEFT CAROTID ARTERY: Moderate plaque without stenosis in the common
carotid. Eccentric partially calcified plaque in the bulb and
proximal ICA resulting in at least mild stenosis. Normal waveforms
and color Doppler signal throughout. ICA mildly tortuous.

LEFT VERTEBRAL ARTERY:  Normal flow direction and waveform.
IMPRESSION: 1. Bilateral carotid bifurcation plaque resulting in less than 50%
diameter ICA stenosis.
2. Antegrade bilateral vertebral arterial flow.

## 2022-09-04 ENCOUNTER — Other Ambulatory Visit: Payer: Self-pay | Admitting: Family Medicine

## 2022-09-04 DIAGNOSIS — J410 Simple chronic bronchitis: Secondary | ICD-10-CM

## 2022-09-05 ENCOUNTER — Ambulatory Visit (INDEPENDENT_AMBULATORY_CARE_PROVIDER_SITE_OTHER): Payer: Medicare HMO | Admitting: Nurse Practitioner

## 2022-09-05 ENCOUNTER — Encounter: Payer: Self-pay | Admitting: Nurse Practitioner

## 2022-09-05 VITALS — BP 127/63 | HR 73 | Temp 97.7°F | Resp 20 | Ht 63.0 in | Wt 145.0 lb

## 2022-09-05 DIAGNOSIS — H01119 Allergic dermatitis of unspecified eye, unspecified eyelid: Secondary | ICD-10-CM

## 2022-09-05 DIAGNOSIS — L2084 Intrinsic (allergic) eczema: Secondary | ICD-10-CM | POA: Diagnosis not present

## 2022-09-05 MED ORDER — TRIAMCINOLONE ACETONIDE 0.1 % EX CREA
1.0000 | TOPICAL_CREAM | Freq: Two times a day (BID) | CUTANEOUS | 0 refills | Status: DC
Start: 2022-09-05 — End: 2023-07-11

## 2022-09-05 NOTE — Progress Notes (Signed)
   Subjective:    Patient ID: Angela Parrish, female    DOB: Sep 24, 1940, 82 y.o.   MRN: [id number]   Chief Complaint: Eyes red and swollen (Happened Friday but better now)   1 week ago patient was at home reading and face felt like it was drawing on right side. She got up and washed her face and seemed to get better. On Friday she woke up with both of her eye lids swollen. Has gotten better but left eye lid feels dry and itchy.        Review of Systems  Eyes:  Negative for visual disturbance. Eye itching: lids- not eye  itself.Marland Kitchen Respiratory: Negative.    Cardiovascular: Negative.   Neurological: Negative.   Psychiatric/Behavioral: Negative.    All other systems reviewed and are negative.      Objective:   Physical Exam Vitals reviewed.  Constitutional:      Appearance: Normal appearance.  Eyes:     Extraocular Movements: Extraocular movements intact.     Pupils: Pupils are equal, round, and reactive to light.     Comments: Both eyelids are flaky and dry appearing  Cardiovascular:     Rate and Rhythm: Normal rate and regular rhythm.     Heart sounds: Normal heart sounds.  Pulmonary:     Effort: Pulmonary effort is normal.     Breath sounds: Normal breath sounds.  Skin:    General: Skin is warm.     Comments: Dry erythematous rash on left wrist  Neurological:     General: No focal deficit present.     Mental Status: She is alert and oriented to person, place, and time.  Psychiatric:        Mood and Affect: Mood normal.        Behavior: Behavior normal.     BP 127/63   Pulse 73   Temp 97.7 F (36.5 C) (Temporal)   Resp 20   Ht '5\' 3"'$  (1.6 m)   Wt 145 lb (65.8 kg)   SpO2 94%   BMI 25.69 kg/m        Assessment & Plan:   [name] in today with chief complaint of Eyes red and swollen (Happened Friday but better now)   1. Contact dermatitis of eyelid, unspecified laterality Cetaphil cream- apply to eyelids 3x a day Avoid hot water Avoid scratching  or rubbing area.  2. Intrinsic atopic dermatitis Do not use triamcinolone on face aovid hot soapy water Avoid scratching -triamcinolone cream (KENALOG) 0.1 %; Apply 1 Application topically 2 (two) times daily.  Dispense: 30 g; Refill: 0    The above assessment and management plan was discussed with the patient. The patient verbalized understanding of and has agreed to the management plan. Patient is aware to call the clinic if symptoms persist or worsen. Patient is aware when to return to the clinic for a follow-up visit. Patient educated on when it is appropriate to go to the emergency department.   Mary-Margaret Hassell Done, FNP

## 2022-12-04 ENCOUNTER — Telehealth: Payer: Self-pay

## 2022-12-04 NOTE — Telephone Encounter (Signed)
Prolia VOB initiated via [email or web address] 

## 2022-12-05 ENCOUNTER — Other Ambulatory Visit (HOSPITAL_COMMUNITY): Payer: Self-pay

## 2022-12-05 NOTE — Telephone Encounter (Signed)
Pharmacy Patient Advocate Encounter   PA for Prolia submitted.   PA submitted on 12/05/22 to (ins) [employer] via CoverMyMeds Key or (Medicaid) confirmation # [id number] Status is pending

## 2022-12-08 NOTE — Telephone Encounter (Signed)
PA Approved

## 2022-12-14 ENCOUNTER — Telehealth: Payer: Self-pay | Admitting: Family Medicine

## 2022-12-14 NOTE — Telephone Encounter (Signed)
Patient calling to check on Prolia and the cost to her.  Will it be $10 as it was before?  Please let her know so she can get scheduled.  Thank you.

## 2022-12-21 NOTE — Telephone Encounter (Signed)
Pt checking on status of prolia shot.

## 2022-12-26 ENCOUNTER — Other Ambulatory Visit (HOSPITAL_COMMUNITY): Payer: Self-pay

## 2022-12-26 NOTE — Telephone Encounter (Signed)
Please see encounter on 12/04/22. Encounter also routed to [employer].

## 2022-12-26 NOTE — Telephone Encounter (Signed)
Pt ready for scheduling for Prolia on or after : 12/26/22  Out-of-pocket cost due at time of visit: $327  Primary: [employer] Prolia co-insurance: 20% Admin fee co-insurance: 20%  Secondary: --- Prolia co-insurance:  Admin fee co-insurance:   Medical Benefit Details: Date Benefits were checked: 12/04/22 Deductible: NO/ Coinsurance: 20%/ Admin Fee: 20%  Prior Auth: APPROVED PA# [id number] Expiration Date: 08/21/2023   Pharmacy benefit: Copay $0 If patient wants fill through the pharmacy benefit please send prescription to: [facility], and include estimated need by date in rx notes. Pharmacy will ship medication directly to the office.  Patient NOT eligible for Prolia Copay Card. Copay Card can make patient's cost as little as $25. Link to apply: [email or web address]://[email or web address]  ** This summary of benefits is an estimation of the patient's out-of-pocket cost. Exact cost may very based on individual plan coverage.

## 2022-12-27 NOTE — Telephone Encounter (Signed)
Per 4/15 encounter - Benefits are approved and patient is ready to be scheduled.

## 2022-12-28 MED ORDER — DENOSUMAB 60 MG/ML ~~LOC~~ SOSY: 60.0000 mg | PREFILLED_SYRINGE | Freq: Once | SUBCUTANEOUS | 0 refills | Status: AC

## 2022-12-28 NOTE — Telephone Encounter (Signed)
RX sent to [facility] for patient.

## 2022-12-28 NOTE — Addendum Note (Signed)
Addended by: Tamera Punt on: 12/28/2022 04:55 PM   Modules accepted: Orders

## 2023-01-01 NOTE — Telephone Encounter (Signed)
Patient aware rx sent to pharmacy.  

## 2023-01-02 ENCOUNTER — Encounter: Payer: Self-pay | Admitting: Family Medicine

## 2023-01-02 ENCOUNTER — Ambulatory Visit (INDEPENDENT_AMBULATORY_CARE_PROVIDER_SITE_OTHER): Payer: Medicare HMO | Admitting: Family Medicine

## 2023-01-02 VITALS — BP 139/81 | HR 74 | Temp 98.7°F | Ht 63.0 in | Wt 142.0 lb

## 2023-01-02 DIAGNOSIS — Z0001 Encounter for general adult medical examination with abnormal findings: Secondary | ICD-10-CM

## 2023-01-02 DIAGNOSIS — I129 Hypertensive chronic kidney disease with stage 1 through stage 4 chronic kidney disease, or unspecified chronic kidney disease: Secondary | ICD-10-CM

## 2023-01-02 DIAGNOSIS — J410 Simple chronic bronchitis: Secondary | ICD-10-CM

## 2023-01-02 DIAGNOSIS — M810 Age-related osteoporosis without current pathological fracture: Secondary | ICD-10-CM | POA: Diagnosis not present

## 2023-01-02 DIAGNOSIS — F5101 Primary insomnia: Secondary | ICD-10-CM | POA: Diagnosis not present

## 2023-01-02 DIAGNOSIS — N184 Chronic kidney disease, stage 4 (severe): Secondary | ICD-10-CM

## 2023-01-02 DIAGNOSIS — I10 Essential (primary) hypertension: Secondary | ICD-10-CM | POA: Diagnosis not present

## 2023-01-02 DIAGNOSIS — M79602 Pain in left arm: Secondary | ICD-10-CM

## 2023-01-02 DIAGNOSIS — Z0000 Encounter for general adult medical examination without abnormal findings: Secondary | ICD-10-CM

## 2023-01-02 DIAGNOSIS — R7303 Prediabetes: Secondary | ICD-10-CM

## 2023-01-02 DIAGNOSIS — E7841 Elevated Lipoprotein(a): Secondary | ICD-10-CM

## 2023-01-02 DIAGNOSIS — Z8673 Personal history of transient ischemic attack (TIA), and cerebral infarction without residual deficits: Secondary | ICD-10-CM | POA: Diagnosis not present

## 2023-01-02 LAB — BAYER DCA HB A1C WAIVED: HB A1C (BAYER DCA - WAIVED): 5.5 % (ref 4.8–5.6)

## 2023-01-02 NOTE — Progress Notes (Signed)
Angela Parrish is a 82 y.o. female presents to office today for annual physical exam examination.    Concerns today include: 1. Left arm pain Reports 3 to 58-month history of left-sided arm pain she describes it as a shooting, intermittently achy pain with no associated numbness, tingling or weakness.  Denies any burning sensation.  Taking Tylenol.  2.  Insomnia She reports that trazodone caused her to be too stimulated so she discontinued after 2 tablets.  She wants to know the safety of some of the over-the-counter medications that are available.  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 reported Last mammogram: UTD Last pap smear: n/a Refills needed today: none Immunizations needed: Immunization History  Administered Date(s) Administered   Fluad Quad(high Dose 65+) 05/05/2019, 05/26/2020, 05/23/2021, 07/03/2022   Influenza, High Dose Seasonal PF 07/11/2016, 06/13/2017, 05/17/2018   Moderna Sars-Covid-2 Vaccination 10/21/2019, 11/18/2019, 07/06/2020, 03/01/2021   PFIZER Comirnaty(Gray Top)Covid-19 Tri-Sucrose Vaccine 07/05/2021   Pneumococcal Conjugate-13 06/20/2017   Pneumococcal Polysaccharide-23 06/25/2014   Tdap 11/22/2016   Zoster Recombinat (Shingrix) 08/31/2021, 02/23/2022   Zoster, Live 09/19/2008     Past Medical History:  Diagnosis Date   Abnormal Pap smear    Previous colposcopy   Acute pulmonary embolism (HCC) 09/21/2018   Arthritis    hip    Asthma    childhood     Basal cell carcinoma 07/08/2001   Right Forehead (curet, excision)   Breast cancer (HCC) 2000   Status post left lumpectomy   Closed fracture of third toe of right foot 03/13/2017   COPD (chronic obstructive pulmonary disease) (HCC)    Dyspnea    on exertion ; improved with use Spiriva    Headache    occ   Hyperlipidemia    Personal history of radiation therapy 2000   Right leg DVT (HCC) 09/22/2018   Social History   Socioeconomic History   Marital status:  Significant Other    Spouse name: IT sales professional   Number of children: 1   Years of education: 12   Highest education level: Some college, no degree  Occupational History   Occupation: Retired    Comment: [employer]  Tobacco Use   Smoking status: Former    Types: Cigarettes    Quit date: 08/14/2007    Years since quitting: 15.3   Smokeless tobacco: Never  Vaping Use   Vaping Use: Never used  Substance and Sexual Activity   Alcohol use: No   Drug use: No   Sexual activity: Yes    Birth control/protection: Post-menopausal  Other Topics Concern   Not on file  Social History Narrative   Retired from [employer] age 16   She has 1 daughter who is 43 years old and lives in [place])   She has 2 grandsons age 8 and 12.   She has a significant other, IT sales professional.     No pets   She loves crafting and thrift stores   Social Determinants of Health   Financial Resource Strain: Low Risk  (02/17/2022)   Overall Financial Resource Strain (CARDIA)    Difficulty of Paying Living Expenses: Not hard at all  Food Insecurity: No Food Insecurity (02/17/2022)   Hunger Vital Sign    Worried About Running Out of Food in the Last Year: Never true    Ran Out of Food in the Last Year: Never true  Transportation Needs: No Transportation Needs (02/17/2022)   PRAPARE - Administrator, Civil Service (  Medical): No    Lack of Transportation (Non-Medical): No  Physical Activity: Insufficiently Active (02/17/2022)   Exercise Vital Sign    Days of Exercise per Week: 7 days    Minutes of Exercise per Session: 10 min  Stress: No Stress Concern Present (02/17/2022)   [employer] of Occupational Health - Occupational Stress Questionnaire    Feeling of Stress : Not at all  Social Connections: Socially Integrated (02/17/2022)   Social Connection and Isolation Panel [NHANES]    Frequency of Communication with Friends and Family: More than three times a week    Frequency of Social Gatherings  with Friends and Family: Twice a week    Attends Religious Services: More than 4 times per year    Active Member of [employer] or Organizations: Yes    Attends Banker Meetings: More than 4 times per year    Marital Status: Living with partner  Intimate Partner Violence: Not At Risk (02/17/2022)   Humiliation, Afraid, Rape, and Kick questionnaire    Fear of Current or Ex-Partner: No    Emotionally Abused: No    Physically Abused: No    Sexually Abused: No   Past Surgical History:  Procedure Laterality Date   BREAST LUMPECTOMY Left 2000   CATARACT EXTRACTION, BILATERAL     COLONOSCOPY N/A 09/16/2014   Procedure: COLONOSCOPY;  Surgeon: Malissa Hippo, MD;  Location: [facility];  Service: Endoscopy;  Laterality: N/A;  830 - moved to 9:15 - Ann to notify   JOINT REPLACEMENT  06/2018   right hip   KNEE CARTILAGE SURGERY Right    TOTAL HIP ARTHROPLASTY Right 07/17/2018   Procedure: RIGHT TOTAL HIP ARTHROPLASTY ANTERIOR APPROACH;  Surgeon: Ollen Gross, MD;  Location: [facility];  Service: Orthopedics;  Laterality: Right;    Family History  Problem Relation Age of Onset   Heart disease Father    Lung cancer Father        smoker   Heart attack Father    Colon cancer Mother 25   Heart attack Mother    Heart disease Sister    Hypertension Brother    Heart attack Brother    Leukemia Brother    Obesity Daughter    Anxiety disorder Daughter    Hypertension Brother     Current Outpatient Medications:    acetaminophen (TYLENOL) 325 MG tablet, Take 2 tablets (650 mg total) by mouth every 6 (six) hours as needed for mild pain (or Fever >/= 101)., Disp: , Rfl:    albuterol (PROVENTIL) (2.5 MG/3ML) 0.083% nebulizer solution, Take 3 mLs (2.5 mg total) by nebulization every 6 (six) hours as needed for wheezing or shortness of breath., Disp: 360 mL, Rfl: 12   albuterol (VENTOLIN HFA) 108 (90 Base) MCG/ACT inhaler, INHALE 2 PUFFS INTO THE LUNGS EVERY 6 HOURS AS NEEDED FOR WHEEZING  OR SHORTNESS OF BREATH., Disp: 18 each, Rfl: 0   aspirin EC 81 MG EC tablet, Take 1 tablet (81 mg total) by mouth daily. Swallow whole., Disp: 30 tablet, Rfl: 11   atorvastatin (LIPITOR) 10 MG tablet, Take 1 tablet (10 mg total) by mouth daily., Disp: 90 tablet, Rfl: 3   Fluticasone-Umeclidin-Vilant (TRELEGY ELLIPTA) 200-62.5-25 MCG/ACT AEPB, INHALE 1 PUFF EVERY DAY, Disp: 180 each, Rfl: 1   guaiFENesin (MUCINEX) 600 MG 12 hr tablet, Take 1 tablet (600 mg total) by mouth 2 (two) times daily., Disp: 30 tablet, Rfl: 0   loratadine (CLARITIN) 10 MG tablet, Take 10 mg by  mouth daily., Disp: , Rfl:    traZODone (DESYREL) 50 MG tablet, Take 0.5-1 tablets (25-50 mg total) by mouth at bedtime as needed for sleep., Disp: 30 tablet, Rfl: 3   triamcinolone cream (KENALOG) 0.1 %, Apply 1 Application topically 2 (two) times daily., Disp: 30 g, Rfl: 0  Current Facility-Administered Medications:    denosumab (PROLIA) injection 60 mg, 60 mg, Subcutaneous, Q6 months, Janine Reller M, DO, 60 mg at 12/06/21 1353  No Known Allergies   ROS: Review of Systems Pertinent items noted in HPI and remainder of comprehensive ROS otherwise negative.    Physical exam BP 139/81   Pulse 74   Temp 98.7 F (37.1 C)   Ht 5\' 3"  (1.6 m)   Wt 142 lb (64.4 kg)   SpO2 96%   BMI 25.15 kg/m  General appearance: alert, cooperative, appears stated age, and no distress Head: Normocephalic, without obvious abnormality, atraumatic Eyes: negative findings: lids and lashes normal, conjunctivae and sclerae normal, corneas clear, and pupils equal, round, reactive to light and accomodation Ears: normal TM's and external ear canals both ears Nose: Nares normal. Septum midline. Mucosa normal. No drainage or sinus tenderness. Throat: lips, mucosa, and tongue normal; teeth and gums normal Neck: no adenopathy, no carotid bruit, supple, symmetrical, trachea midline, and thyroid not enlarged, symmetric, no tenderness/mass/nodules Back:  symmetric, no curvature. ROM normal. No CVA tenderness. Lungs: clear to auscultation bilaterally Heart: regular rate and rhythm, S1, S2 normal, no murmur, click, rub or gallop Abdomen: soft, non-tender; bowel sounds normal; no masses,  no organomegaly Extremities: extremities normal, atraumatic, no cyanosis or edema Pulses: 2+ and symmetric Skin:  Senile purpura and solar lentigo present Lymph nodes: Cervical, supraclavicular, and axillary nodes normal. Neurologic: Grossly normal MSK: Left upper extremity with painful arc sign Flowsheet Row Office Visit from 01/02/2023 in [facility]  PHQ-2 Total Score 0       Assessment/ Plan: Angela Parrish here for annual physical exam.   Annual physical exam  Chronic kidney disease, stage 4 (severe) (HCC) - Plan: CMP14+EGFR, CBC, VITAMIN D 25 Hydroxy (Vit-D Deficiency, Fractures)  Essential hypertension - Plan: CMP14+EGFR  Age-related osteoporosis without current pathological fracture - Plan: CMP14+EGFR, VITAMIN D 25 Hydroxy (Vit-D Deficiency, Fractures), CANCELED: DG [facility] DEXA  History of transient ischemic attack (TIA) - Plan: CMP14+EGFR, TSH, Lipid Panel  Pre-diabetes - Plan: Bayer DCA Hb A1c Waived, CANCELED: Bayer DCA Hb A1c Waived  Left arm pain  Primary insomnia  Check renal function.  Will CC to nephrologist once resulted  Blood pressure controlled.  No changes  She will get DEXA scan at a future date.  Check vitamin D level, calcium  Continue statin for secondary prevention given history of TIA  A1c collected given prediabetic range blood sugar on last visit  I wonder if her left arm pain is coming from her neck versus bursitis.  If she desires going forward we can certainly refer to spinal specialist for further management.  Her treatment is of course limited secondary to CKD.  She is on Tylenol.  Okay to use topical Voltaren if needed.  Did not want to start any prescription alternatives  for insomnia.  She is going to try naturopathic remedies first  Counseled on healthy lifestyle choices, including diet (rich in fruits, vegetables and lean meats and low in salt and simple carbohydrates) and exercise (at least 30 minutes of moderate physical activity daily).  Patient to follow up in 1 year for annual  exam or sooner if needed.  Trinitey Roache M. Lajuana Ripple, DO

## 2023-01-02 NOTE — Telephone Encounter (Signed)
Per [facility], Prolia should be delivered to our office on 01/05/2023.

## 2023-01-02 NOTE — Telephone Encounter (Signed)
Waiting on shipment

## 2023-01-02 NOTE — Telephone Encounter (Signed)
Patient aware that pharmacy called and stated that we should receive delivery on Friday.

## 2023-01-03 LAB — CMP14+EGFR
ALT: 21 IU/L (ref 0–32)
AST: 26 IU/L (ref 0–40)
Albumin/Globulin Ratio: 1.5 (ref 1.2–2.2)
Albumin: 4.4 g/dL (ref 3.7–4.7)
Alkaline Phosphatase: 97 IU/L (ref 44–121)
BUN/Creatinine Ratio: 15 (ref 12–28)
BUN: 33 mg/dL — ABNORMAL HIGH (ref 8–27)
Bilirubin Total: 0.4 mg/dL (ref 0.0–1.2)
CO2: 22 mmol/L (ref 20–29)
Calcium: 10.7 mg/dL — ABNORMAL HIGH (ref 8.7–10.3)
Chloride: 104 mmol/L (ref 96–106)
Creatinine, Ser: 2.23 mg/dL — ABNORMAL HIGH (ref 0.57–1.00)
Globulin, Total: 2.9 g/dL (ref 1.5–4.5)
Glucose: 87 mg/dL (ref 70–99)
Potassium: 4.8 mmol/L (ref 3.5–5.2)
Sodium: 142 mmol/L (ref 134–144)
Total Protein: 7.3 g/dL (ref 6.0–8.5)
eGFR: 21 mL/min/{1.73_m2} — ABNORMAL LOW (ref >59)

## 2023-01-03 LAB — CBC
Hematocrit: 40 % (ref 34.0–46.6)
Hemoglobin: 13.1 g/dL (ref 11.1–15.9)
MCH: 30.7 pg (ref 26.6–33.0)
MCHC: 32.8 g/dL (ref 31.5–35.7)
MCV: 94 fL (ref 79–97)
Platelets: 236 10*3/uL (ref 150–450)
RBC: 4.27 x10E6/uL (ref 3.77–5.28)
RDW: 12.2 % (ref 11.7–15.4)
WBC: 8.1 10*3/uL (ref 3.4–10.8)

## 2023-01-03 LAB — VITAMIN D 25 HYDROXY (VIT D DEFICIENCY, FRACTURES): Vit D, 25-Hydroxy: 40.1 ng/mL (ref 30.0–100.0)

## 2023-01-03 LAB — LIPID PANEL
Chol/HDL Ratio: 2.3 ratio (ref 0.0–4.4)
Cholesterol, Total: 191 mg/dL (ref 100–199)
HDL: 83 mg/dL (ref >39)
LDL Chol Calc (NIH): 93 mg/dL (ref 0–99)
Triglycerides: 84 mg/dL (ref 0–149)
VLDL Cholesterol Cal: 15 mg/dL (ref 5–40)

## 2023-01-03 LAB — TSH: TSH: 3.62 u[IU]/mL (ref 0.450–4.500)

## 2023-01-04 MED ORDER — TRELEGY ELLIPTA 200-62.5-25 MCG/ACT IN AEPB: INHALATION_SPRAY | RESPIRATORY_TRACT | 3 refills | Status: DC

## 2023-01-04 MED ORDER — ATORVASTATIN CALCIUM 10 MG PO TABS: 10.0000 mg | ORAL_TABLET | Freq: Every day | ORAL | 3 refills | Status: DC

## 2023-01-09 NOTE — Telephone Encounter (Signed)
Appointment Friday 05/24 @ 8:30am.

## 2023-01-12 ENCOUNTER — Ambulatory Visit (INDEPENDENT_AMBULATORY_CARE_PROVIDER_SITE_OTHER): Payer: Medicare HMO

## 2023-01-12 DIAGNOSIS — M810 Age-related osteoporosis without current pathological fracture: Secondary | ICD-10-CM

## 2023-01-12 NOTE — Progress Notes (Signed)
Prolia injection given to left arm.  Patient tolerated well. 

## 2023-01-17 DIAGNOSIS — I129 Hypertensive chronic kidney disease with stage 1 through stage 4 chronic kidney disease, or unspecified chronic kidney disease: Secondary | ICD-10-CM | POA: Diagnosis not present

## 2023-01-17 DIAGNOSIS — N184 Chronic kidney disease, stage 4 (severe): Secondary | ICD-10-CM | POA: Diagnosis not present

## 2023-01-17 DIAGNOSIS — N2581 Secondary hyperparathyroidism of renal origin: Secondary | ICD-10-CM | POA: Diagnosis not present

## 2023-02-19 ENCOUNTER — Ambulatory Visit (INDEPENDENT_AMBULATORY_CARE_PROVIDER_SITE_OTHER): Payer: Medicare HMO

## 2023-02-19 VITALS — Ht 62.0 in | Wt 140.0 lb

## 2023-02-19 DIAGNOSIS — Z0000 Encounter for general adult medical examination without abnormal findings: Secondary | ICD-10-CM

## 2023-02-19 DIAGNOSIS — Z780 Asymptomatic menopausal state: Secondary | ICD-10-CM | POA: Diagnosis not present

## 2023-02-19 NOTE — Patient Instructions (Signed)
Angela Parrish , Thank you for taking time to come for your Medicare Wellness Visit. I appreciate your ongoing commitment to your health goals. Please review the following plan we discussed and let me know if I can assist you in the future.   These are the goals we discussed:  Goals      DIET - INCREASE WATER INTAKE     Try to drink 6-8 glasses of water daily.        This is a list of the screening recommended for you and due dates:  Health Maintenance  Topic Date Due   COVID-19 Vaccine (6 - 2023-24 season) 04/21/2022   DEXA scan (bone density measurement)  06/30/2023*   Flu Shot  03/22/2023   Mammogram  05/19/2023   Medicare Annual Wellness Visit  02/19/2024   DTaP/Tdap/Td vaccine (2 - Td or Tdap) 11/23/2026   Pneumonia Vaccine  Completed   Zoster (Shingles) Vaccine  Completed   HPV Vaccine  Aged Out  *Topic was postponed. The date shown is not the original due date.    Advanced directives: In Chart   Conditions/risks identified: Aim for 30 minutes of exercise or brisk walking, 6-8 glasses of water, and 5 servings of fruits and vegetables each day.   Next appointment: Follow up in one year for your annual wellness visit    Preventive Care 65 Years and Older, Female Preventive care refers to lifestyle choices and visits with your health care provider that can promote health and wellness. What does preventive care include? A yearly physical exam. This is also called an annual well check. Dental exams once or twice a year. Routine eye exams. Ask your health care provider how often you should have your eyes checked. Personal lifestyle choices, including: Daily care of your teeth and gums. Regular physical activity. Eating a healthy diet. Avoiding tobacco and drug use. Limiting alcohol use. Practicing safe sex. Taking low-dose aspirin every day. Taking vitamin and mineral supplements as recommended by your health care provider. What happens during an annual well check? The  services and screenings done by your health care provider during your annual well check will depend on your age, overall health, lifestyle risk factors, and family history of disease. Counseling  Your health care provider may ask you questions about your: Alcohol use. Tobacco use. Drug use. Emotional well-being. Home and relationship well-being. Sexual activity. Eating habits. History of falls. Memory and ability to understand (cognition). Work and work Astronomer. Reproductive health. Screening  You may have the following tests or measurements: Height, weight, and BMI. Blood pressure. Lipid and cholesterol levels. These may be checked every 5 years, or more frequently if you are over 32 years old. Skin check. Lung cancer screening. You may have this screening every year starting at age 25 if you have a 30-pack-year history of smoking and currently smoke or have quit within the past 15 years. Fecal occult blood test (FOBT) of the stool. You may have this test every year starting at age 24. Flexible sigmoidoscopy or colonoscopy. You may have a sigmoidoscopy every 5 years or a colonoscopy every 10 years starting at age 80. Hepatitis C blood test. Hepatitis B blood test. Sexually transmitted disease (STD) testing. Diabetes screening. This is done by checking your blood sugar (glucose) after you have not eaten for a while (fasting). You may have this done every 1-3 years. Bone density scan. This is done to screen for osteoporosis. You may have this done starting at age 34. Mammogram. This  may be done every 1-2 years. Talk to your health care provider about how often you should have regular mammograms. Talk with your health care provider about your test results, treatment options, and if necessary, the need for more tests. Vaccines  Your health care provider may recommend certain vaccines, such as: Influenza vaccine. This is recommended every year. Tetanus, diphtheria, and acellular  pertussis (Tdap, Td) vaccine. You may need a Td booster every 10 years. Zoster vaccine. You may need this after age 10. Pneumococcal 13-valent conjugate (PCV13) vaccine. One dose is recommended after age 25. Pneumococcal polysaccharide (PPSV23) vaccine. One dose is recommended after age 6. Talk to your health care provider about which screenings and vaccines you need and how often you need them. This information is not intended to replace advice given to you by your health care provider. Make sure you discuss any questions you have with your health care provider. Document Released: 09/03/2015 Document Revised: 04/26/2016 Document Reviewed: 06/08/2015 Elsevier Interactive Patient Education  2017 [place] Prevention in the Home Falls can cause injuries. They can happen to people of all ages. There are many things you can do to make your home safe and to help prevent falls. What can I do on the outside of my home? Regularly fix the edges of walkways and driveways and fix any cracks. Remove anything that might make you trip as you walk through a door, such as a raised step or threshold. Trim any bushes or trees on the path to your home. Use bright outdoor lighting. Clear any walking paths of anything that might make someone trip, such as rocks or tools. Regularly check to see if handrails are loose or broken. Make sure that both sides of any steps have handrails. Any raised decks and porches should have guardrails on the edges. Have any leaves, snow, or ice cleared regularly. Use sand or salt on walking paths during winter. Clean up any spills in your garage right away. This includes oil or grease spills. What can I do in the bathroom? Use night lights. Install grab bars by the toilet and in the tub and shower. Do not use towel bars as grab bars. Use non-skid mats or decals in the tub or shower. If you need to sit down in the shower, use a plastic, non-slip stool. Keep the floor  dry. Clean up any water that spills on the floor as soon as it happens. Remove soap buildup in the tub or shower regularly. Attach bath mats securely with double-sided non-slip rug tape. Do not have throw rugs and other things on the floor that can make you trip. What can I do in the bedroom? Use night lights. Make sure that you have a light by your bed that is easy to reach. Do not use any sheets or blankets that are too big for your bed. They should not hang down onto the floor. Have a firm chair that has side arms. You can use this for support while you get dressed. Do not have throw rugs and other things on the floor that can make you trip. What can I do in the kitchen? Clean up any spills right away. Avoid walking on wet floors. Keep items that you use a lot in easy-to-reach places. If you need to reach something above you, use a strong step stool that has a grab bar. Keep electrical cords out of the way. Do not use floor polish or wax that makes floors slippery. If you must  use wax, use non-skid floor wax. Do not have throw rugs and other things on the floor that can make you trip. What can I do with my stairs? Do not leave any items on the stairs. Make sure that there are handrails on both sides of the stairs and use them. Fix handrails that are broken or loose. Make sure that handrails are as long as the stairways. Check any carpeting to make sure that it is firmly attached to the stairs. Fix any carpet that is loose or worn. Avoid having throw rugs at the top or bottom of the stairs. If you do have throw rugs, attach them to the floor with carpet tape. Make sure that you have a light switch at the top of the stairs and the bottom of the stairs. If you do not have them, ask someone to add them for you. What else can I do to help prevent falls? Wear shoes that: Do not have high heels. Have rubber bottoms. Are comfortable and fit you well. Are closed at the toe. Do not wear  sandals. If you use a stepladder: Make sure that it is fully opened. Do not climb a closed stepladder. Make sure that both sides of the stepladder are locked into place. Ask someone to hold it for you, if possible. Clearly mark and make sure that you can see: Any grab bars or handrails. First and last steps. Where the edge of each step is. Use tools that help you move around (mobility aids) if they are needed. These include: Canes. Walkers. Scooters. Crutches. Turn on the lights when you go into a dark area. Replace any light bulbs as soon as they burn out. Set up your furniture so you have a clear path. Avoid moving your furniture around. If any of your floors are uneven, fix them. If there are any pets around you, be aware of where they are. Review your medicines with your doctor. Some medicines can make you feel dizzy. This can increase your chance of falling. Ask your doctor what other things that you can do to help prevent falls. This information is not intended to replace advice given to you by your health care provider. Make sure you discuss any questions you have with your health care provider. Document Released: 06/03/2009 Document Revised: 01/13/2016 Document Reviewed: 09/11/2014 Elsevier Interactive Patient Education  2017 [employer].

## 2023-02-19 NOTE — Progress Notes (Signed)
Subjective:   Angela Parrish is a 82 y.o. female who presents for Medicare Annual (Subsequent) preventive examination.  Visit Complete: Virtual  I connected with  Angela Parrish on 02/19/23 by a audio enabled telemedicine application and verified that I am speaking with the correct person using two identifiers.  Patient Location: Home  Provider Location: Home Office  I discussed the limitations of evaluation and management by telemedicine. The patient expressed understanding and agreed to proceed.  Patient Medicare AWV questionnaire was completed by the patient on 02/19/2023; I have confirmed that all information answered by patient is correct and no changes since this date.  Review of Systems     Cardiac Risk Factors include: advanced age (>35men, >82 women);dyslipidemia;hypertension     Objective:    Today's Vitals   02/19/23 1445  Weight: 140 lb (63.5 kg)  Height: 5\' 2"  (1.575 m)   Body mass index is 25.61 kg/m.     02/19/2023    2:51 PM 02/17/2022    9:13 AM 12/16/2021    8:00 PM 05/19/2019    8:29 AM 09/22/2018    2:33 PM 09/21/2018    4:56 PM 09/21/2018    9:46 AM  Advanced Directives  Does Patient Have a Medical Advance Directive? Yes Yes No Yes No Yes Yes  Type of Estate agent of [place];Living will Healthcare Power of [place];Living will  Healthcare Power of [place];Living will Healthcare Power of Attorney Living will Healthcare Power of Attorney  Does patient want to make changes to medical advance directive? No - Patient declined   No - Patient declined No - Patient declined No - Patient declined No - Patient declined  Copy of Healthcare Power of Attorney in Chart? Yes - validated most recent copy scanned in chart (See row information) Yes - validated most recent copy scanned in chart (See row information)  Yes - validated most recent copy scanned in chart (See row information) No - copy requested No - copy requested No - copy requested  Would  patient like information on creating a medical advance directive?   No - Patient declined  No - Patient declined No - Patient declined No - Patient declined    Current Medications (verified) Outpatient Encounter Medications as of 02/19/2023  Medication Sig   acetaminophen (TYLENOL) 325 MG tablet Take 2 tablets (650 mg total) by mouth every 6 (six) hours as needed for mild pain (or Fever >/= 101).   albuterol (PROVENTIL) (2.5 MG/3ML) 0.083% nebulizer solution Take 3 mLs (2.5 mg total) by nebulization every 6 (six) hours as needed for wheezing or shortness of breath.   albuterol (VENTOLIN HFA) 108 (90 Base) MCG/ACT inhaler INHALE 2 PUFFS INTO THE LUNGS EVERY 6 HOURS AS NEEDED FOR WHEEZING OR SHORTNESS OF BREATH.   aspirin EC 81 MG EC tablet Take 1 tablet (81 mg total) by mouth daily. Swallow whole.   atorvastatin (LIPITOR) 10 MG tablet Take 1 tablet (10 mg total) by mouth daily.   Fluticasone-Umeclidin-Vilant (TRELEGY ELLIPTA) 200-62.5-25 MCG/ACT AEPB INHALE 1 PUFF EVERY DAY   guaiFENesin (MUCINEX) 600 MG 12 hr tablet Take 1 tablet (600 mg total) by mouth 2 (two) times daily.   loratadine (CLARITIN) 10 MG tablet Take 10 mg by mouth daily.   triamcinolone cream (KENALOG) 0.1 % Apply 1 Application topically 2 (two) times daily.   Facility-Administered Encounter Medications as of 02/19/2023  Medication   denosumab (PROLIA) injection 60 mg    Allergies (verified) Patient has no known  allergies.   History: Past Medical History:  Diagnosis Date   Abnormal Pap smear    Previous colposcopy   Acute pulmonary embolism (HCC) 09/21/2018   Arthritis    hip    Asthma    childhood     Basal cell carcinoma 07/08/2001   Right Forehead (curet, excision)   Breast cancer (HCC) 2000   Status post left lumpectomy   Closed fracture of third toe of right foot 03/13/2017   COPD (chronic obstructive pulmonary disease) (HCC)    Dyspnea    on exertion ; improved with use Spiriva    Headache    occ    Hyperlipidemia    Personal history of radiation therapy 2000   Right leg DVT (HCC) 09/22/2018   Past Surgical History:  Procedure Laterality Date   BREAST LUMPECTOMY Left 2000   CATARACT EXTRACTION, BILATERAL     COLONOSCOPY N/A 09/16/2014   Procedure: COLONOSCOPY;  Surgeon: Malissa Hippo, MD;  Location: [facility];  Service: Endoscopy;  Laterality: N/A;  830 - moved to 9:15 - Ann to notify   JOINT REPLACEMENT  06/2018   right hip   KNEE CARTILAGE SURGERY Right    TOTAL HIP ARTHROPLASTY Right 07/17/2018   Procedure: RIGHT TOTAL HIP ARTHROPLASTY ANTERIOR APPROACH;  Surgeon: Ollen Gross, MD;  Location: [facility];  Service: Orthopedics;  Laterality: Right;    Family History  Problem Relation Age of Onset   Heart disease Father    Lung cancer Father        smoker   Heart attack Father    Colon cancer Mother 45   Heart attack Mother    Heart disease Sister    Hypertension Brother    Heart attack Brother    Leukemia Brother    Obesity Daughter    Anxiety disorder Daughter    Hypertension Brother    Social History   Socioeconomic History   Marital status: Significant Other    Spouse name: Darlyne Russian   Number of children: 1   Years of education: 12   Highest education level: Some college, no degree  Occupational History   Occupation: Retired    Comment: [employer]  Tobacco Use   Smoking status: Former    Types: Cigarettes    Quit date: 08/14/2007    Years since quitting: 15.5   Smokeless tobacco: Never  Vaping Use   Vaping Use: Never used  Substance and Sexual Activity   Alcohol use: No   Drug use: No   Sexual activity: Yes    Birth control/protection: Post-menopausal  Other Topics Concern   Not on file  Social History Narrative   Retired from [employer] age 61   She has 1 daughter who is 23 years old and lives in [place])   She has 2 grandsons age 18 and 3.   She has a significant other, IT sales professional.     No pets   She loves crafting and  thrift stores   Social Determinants of Health   Financial Resource Strain: Low Risk  (02/19/2023)   Overall Financial Resource Strain (CARDIA)    Difficulty of Paying Living Expenses: Not hard at all  Food Insecurity: No Food Insecurity (02/19/2023)   Hunger Vital Sign    Worried About Running Out of Food in the Last Year: Never true    Ran Out of Food in the Last Year: Never true  Transportation Needs: No Transportation Needs (02/17/2022)   PRAPARE - Transportation  Lack of Transportation (Medical): No    Lack of Transportation (Non-Medical): No  Physical Activity: Insufficiently Active (02/19/2023)   Exercise Vital Sign    Days of Exercise per Week: 3 days    Minutes of Exercise per Session: 30 min  Stress: No Stress Concern Present (02/19/2023)   [employer] of Occupational Health - Occupational Stress Questionnaire    Feeling of Stress : Not at all  Social Connections: Moderately Integrated (02/19/2023)   Social Connection and Isolation Panel [NHANES]    Frequency of Communication with Friends and Family: More than three times a week    Frequency of Social Gatherings with Friends and Family: More than three times a week    Attends Religious Services: More than 4 times per year    Active Member of [employer] or Organizations: Yes    Attends Banker Meetings: More than 4 times per year    Marital Status: Widowed    Tobacco Counseling Counseling given: Not Answered   Clinical Intake:  Pre-visit preparation completed: Yes  Pain : No/denies pain     Nutritional Risks: None Diabetes: No  How often do you need to have someone help you when you read instructions, pamphlets, or other written materials from your doctor or pharmacy?: 1 - Never  Interpreter Needed?: No  Information entered by :: Angela Ora, LPN   Activities of Daily Living    02/19/2023    2:51 PM  In your present state of health, do you have any difficulty performing the following activities:   Hearing? 0  Vision? 0  Difficulty concentrating or making decisions? 0  Walking or climbing stairs? 0  Dressing or bathing? 0  Doing errands, shopping? 0  Preparing Food and eating ? N  Using the Toilet? N  In the past six months, have you accidently leaked urine? N  Do you have problems with loss of bowel control? N  Managing your Medications? N  Managing your Finances? N  Housekeeping or managing your Housekeeping? N    Patient Care Team: Raliegh Ip, DO as PCP - General (Family Medicine) Malissa Hippo, MD (Inactive) as Consulting Physician (Gastroenterology) Ollen Gross, MD as Consulting Physician (Orthopedic Surgery) Annie Sable, MD as Consulting Physician (Nephrology) Glyn Ade, PA-C as Physician Assistant (Dermatology) Michaelle Copas, MD as Referring Physician (Optometry)  Indicate any recent Medical Services you may have received from other than [facility] providers in the past year (date may be approximate).     Assessment:   This is a routine wellness examination for Angela Parrish.  Hearing/Vision screen Vision Screening - Comments:: Wears rx glasses - up to date with routine eye exams with  Dr.Lee   Dietary issues and exercise activities discussed:     Goals Addressed             This Visit's Progress    DIET - INCREASE WATER INTAKE   On track    Try to drink 6-8 glasses of water daily.       Depression Screen    02/19/2023    2:49 PM 01/02/2023    8:07 AM 09/05/2022   12:46 PM 08/02/2022    9:03 AM 07/03/2022    8:32 AM 02/17/2022    9:03 AM 01/25/2022   11:21 AM  PHQ 2/9 Scores  PHQ - 2 Score 0 0 0 0 0 0 0  PHQ- 9 Score 0 4 0 8       Fall Risk  02/19/2023    2:46 PM 01/02/2023    8:16 AM 09/05/2022   12:46 PM 08/02/2022    9:04 AM 07/03/2022    8:34 AM  Fall Risk   Falls in the past year? 0 0 0 0 0  Number falls in past yr: 0 0     Injury with Fall? 0 0     Risk for fall due to : No Fall Risks No Fall Risks     Follow  up Falls prevention discussed Education provided       MEDICARE RISK AT HOME:  Medicare Risk at Home - 02/19/23 1446     Any stairs in or around the home? Yes    If so, are there any without handrails? No    Home free of loose throw rugs in walkways, pet beds, electrical cords, etc? Yes    Adequate lighting in your home to reduce risk of falls? Yes    Life alert? No    Use of a cane, walker or w/c? No    Grab bars in the bathroom? No    Shower chair or bench in shower? No    Elevated toilet seat or a handicapped toilet? No             TIMED UP AND GO:  Was the test performed?  No    Cognitive Function:    05/17/2018    9:41 AM 11/22/2016    8:43 AM  MMSE - Mini Mental State Exam  Orientation to time 5 5  Orientation to Place 5 5  Registration 3 3  Attention/ Calculation 5 5  Recall 2 3  Language- name 2 objects 2 2  Language- repeat 1 1  Language- follow 3 step command 3 3  Language- read & follow direction 1 1  Write a sentence 1 1  Copy design 1 1  Total score 29 30        02/19/2023    2:52 PM 02/17/2022    9:13 AM 03/03/2021    8:31 AM 05/19/2019    8:36 AM  6CIT Screen  What Year? 0 points 0 points 0 points 0 points  What month? 0 points 0 points 0 points 0 points  What time? 0 points 0 points 0 points 0 points  Count back from 20 0 points 0 points 0 points 0 points  Months in reverse 0 points 0 points 0 points 0 points  Repeat phrase 0 points 0 points 0 points 2 points  Total Score 0 points 0 points 0 points 2 points    Immunizations Immunization History  Administered Date(s) Administered   Fluad Quad(high Dose 65+) 05/05/2019, 05/26/2020, 05/23/2021, 07/03/2022   Influenza, High Dose Seasonal PF 07/11/2016, 06/13/2017, 05/17/2018   Moderna Sars-Covid-2 Vaccination 10/21/2019, 11/18/2019, 07/06/2020, 03/01/2021   PFIZER Comirnaty(Gray Top)Covid-19 Tri-Sucrose Vaccine 07/05/2021   Pneumococcal Conjugate-13 06/20/2017   Pneumococcal Polysaccharide-23  06/25/2014   Tdap 11/22/2016   Zoster Recombinant(Shingrix) 08/31/2021, 02/23/2022   Zoster, Live 09/19/2008    TDAP status: Up to date  Flu Vaccine status: Up to date  Pneumococcal vaccine status: Up to date  Covid-19 vaccine status: Completed vaccines  Qualifies for Shingles Vaccine? Yes   Zostavax completed Yes   Shingrix Completed?: Yes  Screening Tests Health Maintenance  Topic Date Due   COVID-19 Vaccine (6 - 2023-24 season) 04/21/2022   DEXA SCAN  06/30/2023 (Originally 05/26/2021)   INFLUENZA VACCINE  03/22/2023   MAMMOGRAM  05/19/2023   Medicare Annual Wellness (  AWV)  02/19/2024   DTaP/Tdap/Td (2 - Td or Tdap) 11/23/2026   Pneumonia Vaccine 73+ Years old  Completed   Zoster Vaccines- Shingrix  Completed   HPV VACCINES  Aged Out    Health Maintenance  Health Maintenance Due  Topic Date Due   COVID-19 Vaccine (6 - 2023-24 season) 04/21/2022    Colorectal cancer screening: No longer required.   Mammogram status: No longer required due to age.  Bone Density status: Ordered 02/19/2023. Pt provided with contact info and advised to call to schedule appt.  Lung Cancer Screening: (Low Dose CT Chest recommended if Age 64-80 years, 20 pack-year currently smoking OR have quit w/in 15years.) does not qualify.   Lung Cancer Screening Referral: n/a  Additional Screening:  Hepatitis C Screening: does not qualify;   Vision Screening: Recommended annual ophthalmology exams for early detection of glaucoma and other disorders of the eye. Is the patient up to date with their annual eye exam?  Yes  Who is the provider or what is the name of the office in which the patient attends annual eye exams? Dr.Lee  If pt is not established with a provider, would they like to be referred to a provider to establish care? No .   Dental Screening: Recommended annual dental exams for proper oral hygiene  Diabetic Foot Exam: Diabetic Foot Exam: Overdue, Pt has been advised about the  importance in completing this exam. Pt is scheduled for diabetic foot exam on next office visit .  Community Resource Referral / Chronic Care Management: CRR required this visit?  No   CCM required this visit?  No     Plan:     I have personally reviewed and noted the following in the patient's chart:   Medical and social history Use of alcohol, tobacco or illicit drugs  Current medications and supplements including opioid prescriptions. Patient is not currently taking opioid prescriptions. Functional ability and status Nutritional status Physical activity Advanced directives List of other physicians Hospitalizations, surgeries, and ER visits in previous 12 months Vitals Screenings to include cognitive, depression, and falls Referrals and appointments  In addition, I have reviewed and discussed with patient certain preventive protocols, quality metrics, and best practice recommendations. A written personalized care plan for preventive services as well as general preventive health recommendations were provided to patient.     Lorrene Reid, LPN   4/0/9811   After Visit Summary: (MyChart) Due to this being a telephonic visit, the after visit summary with patients personalized plan was offered to patient via MyChart   Nurse Notes: none

## 2023-03-14 ENCOUNTER — Encounter: Payer: Self-pay | Admitting: Family Medicine

## 2023-03-20 ENCOUNTER — Other Ambulatory Visit: Payer: Self-pay | Admitting: Family Medicine

## 2023-03-20 DIAGNOSIS — Z1231 Encounter for screening mammogram for malignant neoplasm of breast: Secondary | ICD-10-CM

## 2023-05-15 DIAGNOSIS — N2581 Secondary hyperparathyroidism of renal origin: Secondary | ICD-10-CM | POA: Diagnosis not present

## 2023-05-15 DIAGNOSIS — I129 Hypertensive chronic kidney disease with stage 1 through stage 4 chronic kidney disease, or unspecified chronic kidney disease: Secondary | ICD-10-CM | POA: Diagnosis not present

## 2023-05-15 DIAGNOSIS — N184 Chronic kidney disease, stage 4 (severe): Secondary | ICD-10-CM | POA: Diagnosis not present

## 2023-05-16 LAB — LAB REPORT - SCANNED
Creatinine, POC: 42 mg/dL
EGFR: 21

## 2023-05-21 ENCOUNTER — Ambulatory Visit: Payer: Medicare HMO

## 2023-05-23 DIAGNOSIS — H5203 Hypermetropia, bilateral: Secondary | ICD-10-CM | POA: Diagnosis not present

## 2023-05-23 DIAGNOSIS — H524 Presbyopia: Secondary | ICD-10-CM | POA: Diagnosis not present

## 2023-05-25 ENCOUNTER — Encounter (INDEPENDENT_AMBULATORY_CARE_PROVIDER_SITE_OTHER): Payer: Medicare HMO | Admitting: Ophthalmology

## 2023-05-25 DIAGNOSIS — H43813 Vitreous degeneration, bilateral: Secondary | ICD-10-CM | POA: Diagnosis not present

## 2023-05-25 DIAGNOSIS — H31001 Unspecified chorioretinal scars, right eye: Secondary | ICD-10-CM | POA: Diagnosis not present

## 2023-06-07 ENCOUNTER — Ambulatory Visit
Admission: RE | Admit: 2023-06-07 | Discharge: 2023-06-07 | Disposition: A | Discharge status: Home / Self Care | Payer: Medicare HMO | Source: Ambulatory Visit | Attending: Family Medicine | Admitting: Family Medicine

## 2023-06-07 DIAGNOSIS — Z1231 Encounter for screening mammogram for malignant neoplasm of breast: Secondary | ICD-10-CM

## 2023-06-20 ENCOUNTER — Telehealth: Payer: Self-pay | Admitting: Family Medicine

## 2023-06-20 DIAGNOSIS — M810 Age-related osteoporosis without current pathological fracture: Secondary | ICD-10-CM

## 2023-06-20 MED ORDER — DENOSUMAB 60 MG/ML ~~LOC~~ SOSY
60.0000 mg | PREFILLED_SYRINGE | Freq: Once | SUBCUTANEOUS | Status: AC
  Administered 2023-07-24: 60 mg via SUBCUTANEOUS

## 2023-06-20 NOTE — Telephone Encounter (Signed)
Prolia has been ordered for referral process to take place.  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 Nadine Counts

## 2023-06-25 ENCOUNTER — Other Ambulatory Visit: Payer: Self-pay

## 2023-06-25 DIAGNOSIS — E7841 Elevated Lipoprotein(a): Secondary | ICD-10-CM

## 2023-06-28 NOTE — Telephone Encounter (Signed)
Pharmacy Patient Advocate Encounter  Received notification from [facility] that Prior Authorization for Prolia has been APPROVED from 08/22/23 to 08/20/24   PA #/Case ID/Reference #: [id number]  Approval letter indexed to media tab

## 2023-07-04 NOTE — Telephone Encounter (Signed)
Prolia VOB initiated via [email or web address] 

## 2023-07-11 ENCOUNTER — Other Ambulatory Visit: Payer: Self-pay | Admitting: Family Medicine

## 2023-07-11 ENCOUNTER — Encounter: Payer: Self-pay | Admitting: Family Medicine

## 2023-07-11 ENCOUNTER — Other Ambulatory Visit: Payer: Medicare HMO

## 2023-07-11 ENCOUNTER — Ambulatory Visit (INDEPENDENT_AMBULATORY_CARE_PROVIDER_SITE_OTHER): Payer: Medicare HMO | Admitting: Family Medicine

## 2023-07-11 VITALS — BP 117/69 | HR 74 | Temp 98.5°F | Ht 62.0 in | Wt 143.0 lb

## 2023-07-11 DIAGNOSIS — F419 Anxiety disorder, unspecified: Secondary | ICD-10-CM

## 2023-07-11 DIAGNOSIS — J410 Simple chronic bronchitis: Secondary | ICD-10-CM

## 2023-07-11 DIAGNOSIS — M810 Age-related osteoporosis without current pathological fracture: Secondary | ICD-10-CM | POA: Diagnosis not present

## 2023-07-11 DIAGNOSIS — E7841 Elevated Lipoprotein(a): Secondary | ICD-10-CM

## 2023-07-11 MED ORDER — DENOSUMAB 60 MG/ML ~~LOC~~ SOSY: 60.0000 mg | PREFILLED_SYRINGE | Freq: Once | SUBCUTANEOUS | 0 refills | Status: AC

## 2023-07-11 MED ORDER — BUSPIRONE HCL 5 MG PO TABS: 5.0000 mg | ORAL_TABLET | Freq: Two times a day (BID) | ORAL | 0 refills | Status: AC | PRN

## 2023-07-11 MED ORDER — LEVALBUTEROL HCL 0.63 MG/3ML IN NEBU: 0.6300 mg | INHALATION_SOLUTION | Freq: Four times a day (QID) | RESPIRATORY_TRACT | Status: AC | PRN

## 2023-07-11 NOTE — Patient Instructions (Signed)
Trial of Xopenex.  May use IN PLACE of Albuterol nebulizer.  Use only 1 every 6 hours AS needed for shortness of breath/ wheezing. Let me know if this helps reduce jitteriness you experience normally  Buspirone added if needed for anxiety.  We discussed the dangers of your medication today.  Alprazolam/Xanax can cause hallucinations, falls, breathing problems, dementia and even death.

## 2023-07-11 NOTE — Progress Notes (Signed)
Subjective: CC: Chronic follow-up PCP: Raliegh Ip, DO [id number] Angela Parrish is a 82 y.o. female presenting to clinic today for:  1.  Osteoporosis Patient is tolerating the Prolia injections without difficulty.  She is due to have this repeated this month.  No reports of bony pain.  2.  COPD Patient is compliant with Trelegy and not having any issues with thrush.  She occasionally does have to use a nebulizer treatment, up to twice per month.  She does note that after the nebulizer treatment she feels very tremulous, jittery and has heart palpitations.  Currently utilizing albuterol if needed.  She notes that many years ago her PCP gave her Xanax to combat these sensations.  She has not had this filled since 2013 but wants to know if this is something that we can refill today.  Denies any memory changes, falls, respiratory depression.   ROS: Per HPI  No Known Allergies Past Medical History:  Diagnosis Date   Abnormal Pap smear    Previous colposcopy   Abnormal Pap smear of cervix 06/29/2011   Acute pulmonary embolism (HCC) 09/21/2018   Arthritis    hip    Asthma    childhood     Basal cell carcinoma 07/08/2001   Right Forehead (curet, excision)   Breast cancer (HCC) 2000   Status post left lumpectomy   Closed fracture of third toe of right foot 03/13/2017   COPD (chronic obstructive pulmonary disease) (HCC)    Dyspnea    on exertion ; improved with use Spiriva    Headache    occ   Hyperlipidemia    Nonintractable headache 10/08/2017   Personal history of radiation therapy 2000   Right leg DVT (HCC) 09/22/2018   TIA (transient ischemic attack) 12/16/2021    Current Outpatient Medications:    acetaminophen (TYLENOL) 325 MG tablet, Take 2 tablets (650 mg total) by mouth every 6 (six) hours as needed for mild pain (or Fever >/= 101)., Disp: , Rfl:    albuterol (PROVENTIL) (2.5 MG/3ML) 0.083% nebulizer solution, Take 3 mLs (2.5 mg total) by nebulization every 6 (six)  hours as needed for wheezing or shortness of breath., Disp: 360 mL, Rfl: 12   albuterol (VENTOLIN HFA) 108 (90 Base) MCG/ACT inhaler, INHALE 2 PUFFS INTO THE LUNGS EVERY 6 HOURS AS NEEDED FOR WHEEZING OR SHORTNESS OF BREATH., Disp: 18 each, Rfl: 0   aspirin EC 81 MG EC tablet, Take 1 tablet (81 mg total) by mouth daily. Swallow whole., Disp: 30 tablet, Rfl: 11   atorvastatin (LIPITOR) 10 MG tablet, Take 1 tablet (10 mg total) by mouth daily., Disp: 90 tablet, Rfl: 3   Fluticasone-Umeclidin-Vilant (TRELEGY ELLIPTA) 200-62.5-25 MCG/ACT AEPB, INHALE 1 PUFF EVERY DAY, Disp: 180 each, Rfl: 3   guaiFENesin (MUCINEX) 600 MG 12 hr tablet, Take 1 tablet (600 mg total) by mouth 2 (two) times daily., Disp: 30 tablet, Rfl: 0   loratadine (CLARITIN) 10 MG tablet, Take 10 mg by mouth daily., Disp: , Rfl:    triamcinolone cream (KENALOG) 0.1 %, Apply 1 Application topically 2 (two) times daily., Disp: 30 g, Rfl: 0  Current Facility-Administered Medications:    denosumab (PROLIA) injection 60 mg, 60 mg, Subcutaneous, Q6 months, Kamree Wiens M, DO, 60 mg at 01/12/23 0830   denosumab (PROLIA) injection 60 mg, 60 mg, Subcutaneous, Once, Raliegh Ip, DO Social History   Socioeconomic History   Marital status: Significant Other    Spouse name: Darlyne Russian   Number  of children: 1   Years of education: 12   Highest education level: Some college, no degree  Occupational History   Occupation: Retired    Comment: [employer]  Tobacco Use   Smoking status: Former    Current packs/day: 0.00    Types: Cigarettes    Quit date: 08/14/2007    Years since quitting: 15.9   Smokeless tobacco: Never  Vaping Use   Vaping status: Never Used  Substance and Sexual Activity   Alcohol use: No   Drug use: No   Sexual activity: Yes    Birth control/protection: Post-menopausal  Other Topics Concern   Not on file  Social History Narrative   Retired from [employer] age 67   She has 1 daughter who is 10 years old  and lives in [place])   She has 2 grandsons age 44 and 89.   She has a significant other, IT sales professional.     No pets   She loves crafting and thrift stores   Social Determinants of Health   Financial Resource Strain: Low Risk  (02/19/2023)   Overall Financial Resource Strain (CARDIA)    Difficulty of Paying Living Expenses: Not hard at all  Food Insecurity: No Food Insecurity (02/19/2023)   Hunger Vital Sign    Worried About Running Out of Food in the Last Year: Never true    Ran Out of Food in the Last Year: Never true  Transportation Needs: No Transportation Needs (02/17/2022)   PRAPARE - Administrator, Civil Service (Medical): No    Lack of Transportation (Non-Medical): No  Physical Activity: Insufficiently Active (02/19/2023)   Exercise Vital Sign    Days of Exercise per Week: 3 days    Minutes of Exercise per Session: 30 min  Stress: No Stress Concern Present (02/19/2023)   [employer] of Occupational Health - Occupational Stress Questionnaire    Feeling of Stress : Not at all  Social Connections: Moderately Integrated (02/19/2023)   Social Connection and Isolation Panel [NHANES]    Frequency of Communication with Friends and Family: More than three times a week    Frequency of Social Gatherings with Friends and Family: More than three times a week    Attends Religious Services: More than 4 times per year    Active Member of [employer] or Organizations: Yes    Attends Banker Meetings: More than 4 times per year    Marital Status: Widowed  Intimate Partner Violence: Not At Risk (02/19/2023)   Humiliation, Afraid, Rape, and Kick questionnaire    Fear of Current or Ex-Partner: No    Emotionally Abused: No    Physically Abused: No    Sexually Abused: No   Family History  Problem Relation Age of Onset   Heart disease Father    Lung cancer Father        smoker   Heart attack Father    Colon cancer Mother 11   Heart attack Mother    Heart  disease Sister    Hypertension Brother    Heart attack Brother    Leukemia Brother    Obesity Daughter    Anxiety disorder Daughter    Hypertension Brother     Objective: Office vital signs reviewed. BP 117/69   Pulse 74   Temp 98.5 F (36.9 C)   Ht 5\' 2"  (1.575 m)   Wt 143 lb (64.9 kg)   SpO2 98%   BMI 26.16 kg/m   Physical Examination:  General: Awake, alert, well nourished, No acute distress HEENT: sclera white, MMM Cardio: regular rate and rhythm, S1S2 heard, no murmurs appreciated Pulm: clear to auscultation bilaterally, no wheezes, rhonchi or rales; normal work of breathing on room air Psych: Mood stable, speech normal, affect appropriate     07/11/2023    8:13 AM 02/19/2023    2:49 PM 01/02/2023    8:07 AM  Depression screen PHQ 2/9  Decreased Interest 0 0 0  Down, Depressed, Hopeless 0 0 0  PHQ - 2 Score 0 0 0  Altered sleeping 0 0 2  Tired, decreased energy 0 0 2  Change in appetite 0 0 0  Feeling bad or failure about yourself  0 0 0  Trouble concentrating 0 0 0  Moving slowly or fidgety/restless 0 0 0  Suicidal thoughts 0 0 0  PHQ-9 Score 0 0 4  Difficult doing work/chores Not difficult at all Not difficult at all Not difficult at all      07/11/2023    8:13 AM 01/02/2023    8:07 AM 09/05/2022   12:46 PM 08/02/2022    9:04 AM  GAD 7 : Generalized Anxiety Score  Nervous, Anxious, on Edge 0 0 0 0  Control/stop worrying 0 0 0 0  Worry too much - different things 0 0 0 0  Trouble relaxing 0 0 0 0  Restless 0 0 0 0  Easily annoyed or irritable 0 0 0 0  Afraid - awful might happen 0 0 0 0  Total GAD 7 Score 0 0 0 0  Anxiety Difficulty Not difficult at all Not difficult at all Not difficult at all Not difficult at all      Assessment/ Plan: 82 y.o. female   Simple chronic bronchitis (HCC) - Plan: levalbuterol (XOPENEX) 0.63 MG/3ML nebulizer solution  Anxiety - Plan: busPIRone (BUSPAR) 5 MG tablet  Age-related osteoporosis without current  pathological fracture  Trial of Xopenex.  Sample given.  She knows to use this in place of the albuterol.  Will let me know if this results in less side effects.  BuSpar trial.  Discussed the risks of benzodiazepine use, particularly in the elderly.  I have no concerns about overuse or misuse.  She has not had it refilled since 2013 but we discussed that the risks may outweigh the benefits for her.  We will reassess at her next visit and if does not find that BuSpar is especially helpful can go back on low-dose alprazolam versus Ativan for as needed use.  Will need update UDS and CSA at that time if needed  She will get Prolia drop shipped by her mail order pharmacy.  She will have this administered and hopefully have DEXA scan performed the same day in the next week or 2   Manoah Deckard M Jackalyn Haith, DO [facility] [phone]

## 2023-07-12 ENCOUNTER — Other Ambulatory Visit: Payer: Medicare HMO

## 2023-07-18 ENCOUNTER — Other Ambulatory Visit (HOSPITAL_COMMUNITY): Payer: Self-pay

## 2023-07-18 NOTE — Telephone Encounter (Signed)
Pt ready for scheduling for PROLIA on or after : 07/18/23  Out-of-pocket cost due at time of visit: $346  Primary: [employer] Prolia co-insurance: 20% Admin fee co-insurance: 20%  Secondary: --- Prolia co-insurance:  Admin fee co-insurance:   Medical Benefit Details: Date Benefits were checked: 07/04/23 Deductible: NO/ Coinsurance: 20%/ Admin Fee: 20%  Prior Auth: APPROVED PA# [id number]  Expiration Date: 12/05/22-08/21/23  # of doses approved: 2  Pharmacy benefit: Copay $--- (REFILL TOO SOON - LAST FILLD 07-12-23 NEXT FILL 12-09-23) If patient wants fill through the pharmacy benefit please send prescription to: [facility], and include estimated need by date in rx notes. Pharmacy will ship medication directly to the office.  Patient NOT eligible for Prolia Copay Card. Copay Card can make patient's cost as little as $25. Link to apply: [email or web address]://[email or web address]  ** This summary of benefits is an estimation of the patient's out-of-pocket cost. Exact cost may very based on individual plan coverage.

## 2023-07-24 ENCOUNTER — Ambulatory Visit: Payer: Medicare HMO

## 2023-07-24 DIAGNOSIS — M810 Age-related osteoporosis without current pathological fracture: Secondary | ICD-10-CM | POA: Diagnosis not present

## 2023-07-24 NOTE — Progress Notes (Signed)
Prolia injection given to right upper arm.  Patient tolerated well.  Patient supplied

## 2023-09-03 ENCOUNTER — Ambulatory Visit: Payer: Self-pay | Admitting: Family Medicine

## 2023-09-03 NOTE — Telephone Encounter (Signed)
 Patient called in looking for advice on 'causes of stroke' due to the fact that 30 minutes ago she was sitting at her computer chair and she experienced brief numbness in side of face and a feeling of dizziness. This RN assessed patient to ask if it felt like room was spinning or she was going to faint, patient stated 'no it wasn't that bad at all, it was mild. Assessed patient for any remaining symptoms of stroke (facial asymmetry, weakness in face/arms/legs, balance issues, trouble speaking) patient denies all of the s/s. Patient states I have a really bad sinus infection and congestion and have been taking sinus medication. Patient is denying any continuation of dizziness or weakness/numbness. Advised patient to continue monitoring for s/s of stroke (educated patient on symptoms to look out for) and to seek evaluation at nearest ED, or call 911, if she has any symptoms. Advised patient to have another adult drive her if she needs to go to the emergency room.   Copied from CRM [id number]. Topic: Clinical - Red Word Triage >> Sep 03, 2023 10:07 AM Chase BROCKS wrote: [facility] that prompted transfer to Nurse Triage: Patient called in stating that a few days ago she experienced the right side of her face feeling very numb and was dizzy and was concerned what other reasons besides a stroke could cause this to happen. Reached out to Nurse Triage Line. Reason for Disposition  Health Information question, no triage required and triager able to answer question  Answer Assessment - Initial Assessment Questions 1. REASON FOR CALL or QUESTION: What is your reason for calling today? or How can I best help you? or What question do you have that I can help answer?     Patient asking for causes of stroke - see Nurse Triage Notes  Protocols used: Information Only Call - No Triage-A-AH

## 2023-09-18 DIAGNOSIS — I129 Hypertensive chronic kidney disease with stage 1 through stage 4 chronic kidney disease, or unspecified chronic kidney disease: Secondary | ICD-10-CM | POA: Diagnosis not present

## 2023-09-18 DIAGNOSIS — N184 Chronic kidney disease, stage 4 (severe): Secondary | ICD-10-CM | POA: Diagnosis not present

## 2023-09-18 DIAGNOSIS — N2581 Secondary hyperparathyroidism of renal origin: Secondary | ICD-10-CM | POA: Diagnosis not present

## 2023-09-19 LAB — LAB REPORT - SCANNED
Creatinine, POC: 93.7 mg/dL
EGFR: 21

## 2023-11-29 ENCOUNTER — Other Ambulatory Visit: Payer: Self-pay | Admitting: Family Medicine

## 2023-11-29 DIAGNOSIS — J410 Simple chronic bronchitis: Secondary | ICD-10-CM

## 2024-01-08 ENCOUNTER — Telehealth: Payer: Self-pay

## 2024-01-08 ENCOUNTER — Other Ambulatory Visit (HOSPITAL_COMMUNITY): Payer: Self-pay

## 2024-01-08 ENCOUNTER — Other Ambulatory Visit: Payer: Self-pay

## 2024-01-08 DIAGNOSIS — M810 Age-related osteoporosis without current pathological fracture: Secondary | ICD-10-CM

## 2024-01-08 MED ORDER — DENOSUMAB 60 MG/ML ~~LOC~~ SOSY
60.0000 mg | PREFILLED_SYRINGE | Freq: Once | SUBCUTANEOUS | Status: AC
  Administered 2024-01-25: 60 mg via SUBCUTANEOUS

## 2024-01-08 NOTE — Telephone Encounter (Signed)
 Prolia  VOB initiated via [email or web address]  Next Prolia  inj DUE: 01/21/24   Pharmacy benefit: $0

## 2024-01-08 NOTE — Telephone Encounter (Signed)
 Prolia  sent for benefit verification

## 2024-01-09 ENCOUNTER — Ambulatory Visit (INDEPENDENT_AMBULATORY_CARE_PROVIDER_SITE_OTHER): Payer: Medicare HMO

## 2024-01-09 ENCOUNTER — Encounter: Payer: Self-pay | Admitting: Family Medicine

## 2024-01-09 ENCOUNTER — Ambulatory Visit: Payer: Medicare HMO | Admitting: Family Medicine

## 2024-01-09 ENCOUNTER — Ambulatory Visit (INDEPENDENT_AMBULATORY_CARE_PROVIDER_SITE_OTHER): Payer: Medicare HMO | Admitting: Family Medicine

## 2024-01-09 VITALS — BP 135/88 | HR 78 | Temp 98.6°F | Ht 62.0 in | Wt 141.0 lb

## 2024-01-09 DIAGNOSIS — Z0000 Encounter for general adult medical examination without abnormal findings: Secondary | ICD-10-CM

## 2024-01-09 DIAGNOSIS — R7303 Prediabetes: Secondary | ICD-10-CM

## 2024-01-09 DIAGNOSIS — M810 Age-related osteoporosis without current pathological fracture: Secondary | ICD-10-CM | POA: Diagnosis not present

## 2024-01-09 DIAGNOSIS — J410 Simple chronic bronchitis: Secondary | ICD-10-CM | POA: Diagnosis not present

## 2024-01-09 DIAGNOSIS — N184 Chronic kidney disease, stage 4 (severe): Secondary | ICD-10-CM

## 2024-01-09 DIAGNOSIS — L723 Sebaceous cyst: Secondary | ICD-10-CM | POA: Insufficient documentation

## 2024-01-09 DIAGNOSIS — I10 Essential (primary) hypertension: Secondary | ICD-10-CM | POA: Diagnosis not present

## 2024-01-09 DIAGNOSIS — Z0001 Encounter for general adult medical examination with abnormal findings: Secondary | ICD-10-CM | POA: Diagnosis not present

## 2024-01-09 DIAGNOSIS — Z780 Asymptomatic menopausal state: Secondary | ICD-10-CM

## 2024-01-09 DIAGNOSIS — E7841 Elevated Lipoprotein(a): Secondary | ICD-10-CM

## 2024-01-09 DIAGNOSIS — R6889 Other general symptoms and signs: Secondary | ICD-10-CM | POA: Diagnosis not present

## 2024-01-09 LAB — BAYER DCA HB A1C WAIVED: HB A1C (BAYER DCA - WAIVED): 5.4 % (ref 4.8–5.6)

## 2024-01-09 MED ORDER — ATORVASTATIN CALCIUM 10 MG PO TABS: 10.0000 mg | ORAL_TABLET | Freq: Every day | ORAL | 3 refills | Status: AC

## 2024-01-09 MED ORDER — TRELEGY ELLIPTA 200-62.5-25 MCG/ACT IN AEPB: INHALATION_SPRAY | RESPIRATORY_TRACT | 3 refills | Status: AC

## 2024-01-09 NOTE — Patient Instructions (Signed)
 Pocket of Fluid in the Skin (Epidermoid Cyst): What to Know  An epidermoid cyst is a small lump under your skin. The cyst contains a substance that is thick and oily. What are the causes? A blocked hair follicle. A hair curls and re-enters the skin instead of growing straight out of the skin. A blocked pore. Irritated skin. An injury to the skin. Certain conditions that are passed from parent to child. Human papillomavirus (HPV). This happens rarely when cysts occur on the bottom of the feet. Long-term sun damage to the skin. What increases the risk? Having acne. Being female. Having an injury to the skin. Being past puberty. Certain conditions that are passed down through family (genetic disorder). What are the signs or symptoms? The only sign of this type of cyst may be a small, painless lump under the skin. These cysts are usually painless, but they can get infected. Symptoms of infection may include: Redness. Inflammation. Tenderness. Warmth. Fever. A bad-smelling substance that drains from the cyst. Pus that drains from the cyst. How is this treated? If a cyst becomes inflamed, treatment may include: Opening and draining the cyst. Antibiotics. Shots of medicines called steroids that help lessen inflammation. Surgery to remove the cyst if it's large, painful, or could turn into cancer. Do not try to open or squeeze a cyst yourself. Follow these instructions at home: Medicines Take your medicines only as told. If you were given antibiotics, take them as told. Do not stop taking them even if you start to feel better. General instructions Keep the area around your cyst clean and dry. Wear loose, dry clothing. Avoid touching your cyst. Check your cyst every day for signs of infection. Check for: Redness, swelling, or pain. Fluid or blood. Warmth. Pus or a bad smell. Keep all follow-up visits to make sure there's no discomfort or infection. Contact a doctor if: You have  any signs of infection. Your cyst doesn't get better or gets worse. You get a cyst that looks different from other cysts you've had. You have a fever. You have redness that spreads from the cyst. This information is not intended to replace advice given to you by your health care provider. Make sure you discuss any questions you have with your health care provider. Document Revised: 03/23/2023 Document Reviewed: 03/23/2023 Elsevier Patient Education  2024 [employer].

## 2024-01-09 NOTE — Progress Notes (Signed)
 Angela Parrish is a 83 y.o. female presents to office today for annual physical exam examination.    Concerns today include: 1.  Skin lesion She reports a spot on the right anterior shoulder near her axilla this is been present for quite some time now.  It started as a lump that she was able to squeeze stuff out of but it never really went away and she can feel something under the skin.  She denies any pain, oozing, fevers etc.  Occupation: retired Health Maintenance Due  Topic Date Due   DEXA SCAN  05/26/2021   Medicare Annual Wellness (AWV)  02/19/2024   Refills needed today: none  Immunization History  Administered Date(s) Administered   Fluad Quad(high Dose 65+) 05/05/2019, 05/26/2020, 05/23/2021, 07/03/2022   Influenza, High Dose Seasonal PF 07/11/2016, 06/13/2017, 05/17/2018   Moderna Sars-Covid-2 Vaccination 10/21/2019, 11/18/2019, 07/06/2020, 03/01/2021   PFIZER Comirnaty(Gray Top)Covid-19 Tri-Sucrose Vaccine 07/05/2021   Pneumococcal Conjugate-13 06/20/2017   Pneumococcal Polysaccharide-23 06/25/2014   Tdap 11/22/2016   Zoster Recombinant(Shingrix ) 08/31/2021, 02/23/2022   Zoster, Live 09/19/2008   Past Medical History:  Diagnosis Date   Abnormal Pap smear    Previous colposcopy   Abnormal Pap smear of cervix 06/29/2011   Acute pulmonary embolism (HCC) 09/21/2018   Arthritis    hip    Asthma    childhood     Basal cell carcinoma 07/08/2001   Right Forehead (curet, excision)   Breast cancer (HCC) 2000   Status post left lumpectomy   Closed fracture of third toe of right foot 03/13/2017   COPD (chronic obstructive pulmonary disease) (HCC)    Dyspnea    on exertion ; improved with use Spiriva     Headache    occ   Hyperlipidemia    Nonintractable headache 10/08/2017   Personal history of radiation therapy 2000   Right leg DVT (HCC) 09/22/2018   TIA (transient ischemic attack) 12/16/2021   Social History   Socioeconomic History   Marital status:  Significant Other    Spouse name: IT sales professional   Number of children: 1   Years of education: 12   Highest education level: Some college, no degree  Occupational History   Occupation: Retired    Comment: [employer]  Tobacco Use   Smoking status: Former    Current packs/day: 0.00    Types: Cigarettes    Quit date: 08/14/2007    Years since quitting: 16.4   Smokeless tobacco: Never  Vaping Use   Vaping status: Never Used  Substance and Sexual Activity   Alcohol use: No   Drug use: No   Sexual activity: Yes    Birth control/protection: Post-menopausal  Other Topics Concern   Not on file  Social History Narrative   Retired from [employer] age 89   She has 1 daughter who is 51 years old and lives in [place])   She has 2 grandsons age 50 and 48.   She has a significant other, IT sales professional.     No pets   She loves crafting and thrift stores   Social Drivers of Health   Financial Resource Strain: Low Risk  (02/19/2023)   Overall Financial Resource Strain (CARDIA)    Difficulty of Paying Living Expenses: Not hard at all  Food Insecurity: No Food Insecurity (02/19/2023)   Hunger Vital Sign    Worried About Running Out of Food in the Last Year: Never true    Ran Out of Food in the Last Year: Never  true  Transportation Needs: No Transportation Needs (02/17/2022)   PRAPARE - Administrator, Civil Service (Medical): No    Lack of Transportation (Non-Medical): No  Physical Activity: Insufficiently Active (02/19/2023)   Exercise Vital Sign    Days of Exercise per Week: 3 days    Minutes of Exercise per Session: 30 min  Stress: No Stress Concern Present (02/19/2023)   [employer] of Occupational Health - Occupational Stress Questionnaire    Feeling of Stress : Not at all  Social Connections: Moderately Integrated (02/19/2023)   Social Connection and Isolation Panel [NHANES]    Frequency of Communication with Friends and Family: More than three times a week     Frequency of Social Gatherings with Friends and Family: More than three times a week    Attends Religious Services: More than 4 times per year    Active Member of [employer] or Organizations: Yes    Attends Banker Meetings: More than 4 times per year    Marital Status: Widowed  Intimate Partner Violence: Not At Risk (02/19/2023)   Humiliation, Afraid, Rape, and Kick questionnaire    Fear of Current or Ex-Partner: No    Emotionally Abused: No    Physically Abused: No    Sexually Abused: No   Past Surgical History:  Procedure Laterality Date   BREAST LUMPECTOMY Left 2000   CATARACT EXTRACTION, BILATERAL     COLONOSCOPY N/A 09/16/2014   Procedure: COLONOSCOPY;  Surgeon: Ruby Corporal, MD;  Location: [facility];  Service: Endoscopy;  Laterality: N/A;  830 - moved to 9:15 - Ann to notify   JOINT REPLACEMENT  06/2018   right hip   KNEE CARTILAGE SURGERY Right    TOTAL HIP ARTHROPLASTY Right 07/17/2018   Procedure: RIGHT TOTAL HIP ARTHROPLASTY ANTERIOR APPROACH;  Surgeon: Liliane Rei, MD;  Location: [facility];  Service: Orthopedics;  Laterality: Right;    Family History  Problem Relation Age of Onset   Heart disease Father    Lung cancer Father        smoker   Heart attack Father    Colon cancer Mother 90   Heart attack Mother    Heart disease Sister    Hypertension Brother    Heart attack Brother    Leukemia Brother    Obesity Daughter    Anxiety disorder Daughter    Hypertension Brother     Current Outpatient Medications:    acetaminophen  (TYLENOL ) 325 MG tablet, Take 2 tablets (650 mg total) by mouth every 6 (six) hours as needed for mild pain (or Fever >/= 101)., Disp: , Rfl:    albuterol  (PROVENTIL ) (2.5 MG/3ML) 0.083% nebulizer solution, Take 3 mLs (2.5 mg total) by nebulization every 6 (six) hours as needed for wheezing or shortness of breath., Disp: 360 mL, Rfl: 12   albuterol  (VENTOLIN  HFA) 108 (90 Base) MCG/ACT inhaler, INHALE 2 PUFFS INTO THE LUNGS  EVERY 6 HOURS AS NEEDED FOR WHEEZING OR SHORTNESS OF BREATH., Disp: 18 each, Rfl: 0   aspirin  EC 81 MG EC tablet, Take 1 tablet (81 mg total) by mouth daily. Swallow whole., Disp: 30 tablet, Rfl: 11   atorvastatin  (LIPITOR) 10 MG tablet, Take 1 tablet (10 mg total) by mouth daily., Disp: 90 tablet, Rfl: 3   busPIRone  (BUSPAR ) 5 MG tablet, Take 1 tablet (5 mg total) by mouth 2 (two) times daily as needed (anxiety)., Disp: 60 tablet, Rfl: 0   Fluticasone -Umeclidin-Vilant (TRELEGY ELLIPTA ) 200-62.5-25 MCG/ACT AEPB,  INHALE 1 PUFF EVERY DAY, Disp: 180 each, Rfl: 0   levalbuterol  (XOPENEX ) 0.63 MG/3ML nebulizer solution, Take 3 mLs (0.63 mg total) by nebulization every 6 (six) hours as needed for wheezing or shortness of breath., Disp: , Rfl:   Current Facility-Administered Medications:    denosumab  (PROLIA ) injection 60 mg, 60 mg, Subcutaneous, Q6 months, Vaunda Gutterman M, DO, 60 mg at 01/12/23 0830   [START ON 01/22/2024] denosumab  (PROLIA ) injection 60 mg, 60 mg, Subcutaneous, Once, Artemisia Auvil M, DO  No Known Allergies   ROS: Review of Systems A comprehensive review of systems was negative except for: Eyes: positive for sensation of foreign body in left eye.  Has appoint with Dr. Augustus Ledger soon Respiratory: positive for dyspnea on exertion Integument/breast: positive for skin lesion(s)    Physical exam BP (!) 147/72   Pulse 78   Temp 98.6 F (37 C)   Ht 5\' 2"  (1.575 m)   Wt 141 lb (64 kg)   SpO2 96%   BMI 25.79 kg/m  General appearance: alert, cooperative, appears stated age, and no distress Head: Normocephalic, without obvious abnormality, atraumatic Eyes: negative findings: lids and lashes normal, conjunctivae and sclerae normal, corneas clear, and pupils equal, round, reactive to light and accomodation Ears: normal TM's and external ear canals both ears Nose: Nares normal. Septum midline. Mucosa normal. No drainage or sinus tenderness. Throat: lips, mucosa, and tongue normal;  teeth and gums normal Neck: no adenopathy, supple, symmetrical, trachea midline, and thyroid  not enlarged, symmetric, no tenderness/mass/nodules Back: Increased kyphosis of the thoracic spine present Lungs: Globally decreased breath sounds but otherwise clear to auscultation bilaterally with no wheezes, rhonchi or rales.  Normal work of breathing on room air Heart: regular rate and rhythm, S1, S2 normal, no murmur, click, rub or gallop Abdomen: soft, non-tender; bowel sounds normal; no masses,  no organomegaly Extremities: extremities normal, atraumatic, no cyanosis or edema Pulses: 2+ and symmetric Skin: Epidermoid cyst appreciated with visible punctum.  No active drainage.  No tenderness to palpation.  No erythema or warmth Lymph nodes: Cervical, supraclavicular, and axillary nodes normal. Neurologic: Grossly normal      01/09/2024   10:30 AM 07/11/2023    8:13 AM 02/19/2023    2:49 PM  Depression screen PHQ 2/9  Decreased Interest 0 0 0  Down, Depressed, Hopeless 0 0 0  PHQ - 2 Score 0 0 0  Altered sleeping 0 0 0  Tired, decreased energy 0 0 0  Change in appetite 0 0 0  Feeling bad or failure about yourself  0 0 0  Trouble concentrating 0 0 0  Moving slowly or fidgety/restless 0 0 0  Suicidal thoughts 0 0 0  PHQ-9 Score 0 0 0  Difficult doing work/chores Not difficult at all Not difficult at all Not difficult at all      01/09/2024   10:30 AM 07/11/2023    8:13 AM 01/02/2023    8:07 AM 09/05/2022   12:46 PM  GAD 7 : Generalized Anxiety Score  Nervous, Anxious, on Edge 0 0 0 0  Control/stop worrying 0 0 0 0  Worry too much - different things 0 0 0 0  Trouble relaxing 0 0 0 0  Restless 0 0 0 0  Easily annoyed or irritable 0 0 0 0  Afraid - awful might happen 0 0 0 0  Total GAD 7 Score 0 0 0 0  Anxiety Difficulty Not difficult at all Not difficult at all Not difficult at all  Not difficult at all     Assessment/ Plan: Nobie Batch here for annual physical exam.    Annual physical exam  Age-related osteoporosis without current pathological fracture - Plan: VITAMIN D  25 Hydroxy (Vit-D Deficiency, Fractures), CMP14+EGFR  Chronic kidney disease, stage 4 (severe) (HCC) - Plan: VITAMIN D  25 Hydroxy (Vit-D Deficiency, Fractures), CMP14+EGFR, CBC  Essential hypertension - Plan: CMP14+EGFR  Elevated lipoprotein(a) - Plan: Lipid Panel, atorvastatin  (LIPITOR) 10 MG tablet  Pre-diabetes - Plan: Bayer DCA Hb A1c Waived  Simple chronic bronchitis (HCC) - Plan: Fluticasone -Umeclidin-Vilant (TRELEGY ELLIPTA ) 200-62.5-25 MCG/ACT AEPB  Sebaceous cyst of axilla  I renewed medications that required refills.  Fasting labs ordered.  She will have DEXA scan done today.  Check renal function, CBC, vitamin D  given advanced renal disease.  Keep appointments with nephrology  Blood pressure borderline but technically controlled for age  Doing well on Trelegy and as needed Xopenex .  No refills needed of the Xopenex  at this time  Supportive care for the sebaceous cyst.  Discussed if becomes infected or if she wants to have it removed I will be glad to refer  Counseled on healthy lifestyle choices, including diet (rich in fruits, vegetables and lean meats and low in salt and simple carbohydrates) and exercise (at least 30 minutes of moderate physical activity daily).  Patient to follow up 63m  Esiquio Boesen M. Bonnell Butcher, DO

## 2024-01-10 LAB — CMP14+EGFR
ALT: 17 IU/L (ref 0–32)
AST: 26 IU/L (ref 0–40)
Albumin: 4.6 g/dL (ref 3.7–4.7)
Alkaline Phosphatase: 87 IU/L (ref 44–121)
BUN/Creatinine Ratio: 15 (ref 12–28)
BUN: 38 mg/dL — ABNORMAL HIGH (ref 8–27)
Bilirubin Total: 0.4 mg/dL (ref 0.0–1.2)
CO2: 22 mmol/L (ref 20–29)
Calcium: 10.4 mg/dL — ABNORMAL HIGH (ref 8.7–10.3)
Chloride: 101 mmol/L (ref 96–106)
Creatinine, Ser: 2.47 mg/dL — ABNORMAL HIGH (ref 0.57–1.00)
Globulin, Total: 2.8 g/dL (ref 1.5–4.5)
Glucose: 86 mg/dL (ref 70–99)
Potassium: 4.7 mmol/L (ref 3.5–5.2)
Sodium: 141 mmol/L (ref 134–144)
Total Protein: 7.4 g/dL (ref 6.0–8.5)
eGFR: 19 mL/min/{1.73_m2} — ABNORMAL LOW (ref >59)

## 2024-01-10 LAB — CBC
Hematocrit: 40.9 % (ref 34.0–46.6)
Hemoglobin: 13.5 g/dL (ref 11.1–15.9)
MCH: 31.6 pg (ref 26.6–33.0)
MCHC: 33 g/dL (ref 31.5–35.7)
MCV: 96 fL (ref 79–97)
Platelets: 223 10*3/uL (ref 150–450)
RBC: 4.27 x10E6/uL (ref 3.77–5.28)
RDW: 12 % (ref 11.7–15.4)
WBC: 7.2 10*3/uL (ref 3.4–10.8)

## 2024-01-10 LAB — LIPID PANEL
Chol/HDL Ratio: 2.2 ratio (ref 0.0–4.4)
Cholesterol, Total: 198 mg/dL (ref 100–199)
HDL: 89 mg/dL (ref >39)
LDL Chol Calc (NIH): 97 mg/dL (ref 0–99)
Triglycerides: 67 mg/dL (ref 0–149)
VLDL Cholesterol Cal: 12 mg/dL (ref 5–40)

## 2024-01-10 LAB — VITAMIN D 25 HYDROXY (VIT D DEFICIENCY, FRACTURES): Vit D, 25-Hydroxy: 36.9 ng/mL (ref 30.0–100.0)

## 2024-01-11 ENCOUNTER — Ambulatory Visit: Payer: Self-pay | Admitting: Family Medicine

## 2024-01-11 DIAGNOSIS — M810 Age-related osteoporosis without current pathological fracture: Secondary | ICD-10-CM | POA: Diagnosis not present

## 2024-01-11 DIAGNOSIS — Z780 Asymptomatic menopausal state: Secondary | ICD-10-CM | POA: Diagnosis not present

## 2024-01-15 ENCOUNTER — Other Ambulatory Visit (HOSPITAL_COMMUNITY): Payer: Self-pay

## 2024-01-15 DIAGNOSIS — N184 Chronic kidney disease, stage 4 (severe): Secondary | ICD-10-CM | POA: Diagnosis not present

## 2024-01-15 DIAGNOSIS — I129 Hypertensive chronic kidney disease with stage 1 through stage 4 chronic kidney disease, or unspecified chronic kidney disease: Secondary | ICD-10-CM | POA: Diagnosis not present

## 2024-01-15 DIAGNOSIS — N2581 Secondary hyperparathyroidism of renal origin: Secondary | ICD-10-CM | POA: Diagnosis not present

## 2024-01-15 NOTE — Telephone Encounter (Signed)
 Angela Parrish

## 2024-01-15 NOTE — Telephone Encounter (Signed)
 Pt ready for scheduling for PROLIA  on or after : 01/21/24  Option# 1: Buy/Bill (Office supplied medication)  Out-of-pocket cost due at time of clinic visit: $357  Number of injection/visits approved: 2  Primary: [employer] Prolia  co-insurance: 20% Admin fee co-insurance: 20%  Secondary: --- Prolia  co-insurance:  Admin fee co-insurance:   Medical Benefit Details: Date Benefits were checked: 01/11/24 Deductible: NO/ Coinsurance: 20%/ Admin Fee: 20%  Prior Auth: APPROVED PA# [id number] Expiration Date: 08/22/23-08/20/24  # of doses approved: 2 ----------------------------------------------------------------------- Option# 2- Med Obtained from pharmacy:  Pharmacy benefit: Copay $0 (Paid to pharmacy) Admin Fee: 20% approximately $25   (Pay at clinic)  Prior Auth: N/A PA# Expiration Date:   # of doses approved:   If patient wants fill through the pharmacy benefit please send prescription to: [facility], and include estimated need by date in rx notes. Pharmacy will ship medication directly to the office.  Patient NOT eligible for Prolia  Copay Card. Copay Card can make patient's cost as little as $25. Link to apply: [email or web address]://[email or web address].[email or web address]  ** This summary of benefits is an estimation of the patient's out-of-pocket cost. Exact cost may very based on individual plan coverage.

## 2024-01-16 ENCOUNTER — Other Ambulatory Visit: Payer: Self-pay

## 2024-01-16 ENCOUNTER — Ambulatory Visit: Payer: Self-pay | Admitting: Family Medicine

## 2024-01-16 MED ORDER — DENOSUMAB 60 MG/ML ~~LOC~~ SOSY
60.0000 mg | PREFILLED_SYRINGE | Freq: Once | SUBCUTANEOUS | 0 refills | Status: AC
Start: 2024-01-16 — End: 2024-01-16

## 2024-01-24 ENCOUNTER — Telehealth: Payer: Self-pay

## 2024-01-24 NOTE — Telephone Encounter (Signed)
 Prolia  arrived from pharmacy called patient to schedule appt - patient would like to come tomorrow. I reviewed labs from  01/09/2024 and calcium  was elevated - please review and advise

## 2024-01-24 NOTE — Telephone Encounter (Signed)
 Calcium  level reviewed with PCP Likely 2/2 parathyroid, but seeing nephro Okay to give prolia   Level is trending down

## 2024-01-25 ENCOUNTER — Ambulatory Visit (INDEPENDENT_AMBULATORY_CARE_PROVIDER_SITE_OTHER)

## 2024-01-25 DIAGNOSIS — M810 Age-related osteoporosis without current pathological fracture: Secondary | ICD-10-CM

## 2024-01-25 NOTE — Progress Notes (Signed)
 Patient is in office today for a nurse visit for  Prolia injection . Patient Injection was given in the  Left arm. Patient tolerated injection well.

## 2024-01-25 NOTE — Telephone Encounter (Signed)
 noted

## 2024-01-28 ENCOUNTER — Ambulatory Visit: Payer: Self-pay

## 2024-01-28 NOTE — Telephone Encounter (Signed)
 FYI Only or Action Required?: Action required by provider  Patient was last seen in primary care on 01/09/2024 by Eliodoro Guerin, DO. Called Nurse Triage reporting Neurologic Problem. Symptoms began several days ago. Interventions attempted: Nothing. Symptoms are: Saturday had numbness , tingling left hand and arm. Thinks it is a side effect from Prolia  given Friday.completely resolved. No availability today with her PCP, wants to see her only. Please advise pt.  Triage Disposition: See HCP Within 4 Hours (Or PCP Triage)  Patient/caregiver understands and will follow disposition?: Yes           Copied from CRM [id number]. Topic: Clinical - Red Word Triage >> Jan 28, 2024  8:28 AM Blair Bumpers wrote: Red Word that prompted transfer to Nurse Triage: Patient states she had a Prolia  shot on Friday. Saturday morning her hand & thumb, face and jaw area, all started to become numb & tingling. Wants to know what does she need to do. Reason for Disposition  [1] Weakness of the face, arm / hand, or leg / foot on one side of the body AND [2] gradual onset (e.g., days to weeks) AND [3] present now  Answer Assessment - Initial Assessment Questions 1. SYMPTOM: "What is the main symptom you are concerned about?" (e.g., weakness, numbness)     Tingling to left left arm,hand 2. ONSET: "When did this start?" (minutes, hours, days; while sleeping)     Saturday 3. LAST NORMAL: "When was the last time you (the patient) were normal (no symptoms)?"     Friday 4. PATTERN "Does this come and go, or has it been constant since it started?"  "Is it present now?"     Gone now 5. CARDIAC SYMPTOMS: "Have you had any of the following symptoms: chest pain, difficulty breathing, palpitations?"     No 6. NEUROLOGIC SYMPTOMS: "Have you had any of the following symptoms: headache, dizziness, vision loss, double vision, changes in speech, unsteady on your feet?"     No 7. OTHER SYMPTOMS: "Do you have any other  symptoms?"     No 8. PREGNANCY: "Is there any chance you are pregnant?" "When was your last menstrual period?"     No  Protocols used: Neurologic Deficit-A-AH

## 2024-01-28 NOTE — Telephone Encounter (Signed)
 I am not aware of prolia  causing numbness unless it happened to be injected very closely to a nerve.  Sadly, I have nothing but video visits available tomorrow. Ok to offer that if she would like. Otherwise, I advise she consider seeing one of my partners in office.

## 2024-01-29 ENCOUNTER — Ambulatory Visit: Payer: Self-pay

## 2024-01-29 NOTE — Telephone Encounter (Signed)
 Needs to go to ER if having stroke symptoms. Please see yesterday's phone note.

## 2024-01-29 NOTE — Telephone Encounter (Signed)
Left message to schedule appt

## 2024-01-29 NOTE — Telephone Encounter (Signed)
 FYI Only or Action Required?: Action required by provider  Patient was last seen in primary care on 01/09/2024 by Eliodoro Guerin, DO. Called Nurse Triage reporting Numbness. Symptoms began several days ago. Interventions attempted: Nothing. Symptoms are: unchanged.  Triage Disposition: See HCP Within 4 Hours (Or PCP Triage)  Patient/caregiver understands and will follow disposition?: YesReason for Disposition  [1] Weakness of the face, arm / hand, or leg / foot on one side of the body AND [2] gradual onset (e.g., days to weeks) AND [3] present now  Protocols used: Neurologic Deficit-A-AH

## 2024-01-29 NOTE — Telephone Encounter (Signed)
 Copied from CRM [id number]. Topic: Clinical - Red Word Triage >> Jan 28, 2024  8:28 AM Blair Bumpers wrote: Red Word that prompted transfer to Nurse Triage: Patient states she had a Prolia  shot on Friday. Saturday morning her hand & thumb, face and jaw area, all started to become numb & tingling. Wants to know what does she need to do. Answer Assessment - Initial Assessment Questions 1. SYMPTOM: "What is the main symptom you are concerned about?" (e.g., weakness, numbness)     Numbness in left hand and thumb 2. ONSET: "When did this start?" (minutes, hours, days; while sleeping)     Saturday  3. LAST NORMAL: "When was the last time you (the patient) were normal (no symptoms)?"     Friday  4. PATTERN "Does this come and go, or has it been constant since it started?"  "Is it present now?"     Comes and goes 5. CARDIAC SYMPTOMS: "Have you had any of the following symptoms: chest pain, difficulty breathing, palpitations?"     Denies  6. NEUROLOGIC SYMPTOMS: "Have you had any of the following symptoms: headache, dizziness, vision loss, double vision, changes in speech, unsteady on your feet?"     Slight headache on Saturday  7. OTHER SYMPTOMS: "Do you have any other symptoms?"     Tingling on jaw bone on left side    Pt had Prolia  shot on Friday and numbness started on Saturday. Currently in thumb but is intermittent and can travel to elbow.  Protocols used: Neurologic Deficit-A-AH

## 2024-01-30 ENCOUNTER — Ambulatory Visit

## 2024-01-30 ENCOUNTER — Encounter: Payer: Self-pay | Admitting: Family Medicine

## 2024-01-30 VITALS — BP 125/71 | HR 77 | Temp 98.0°F | Ht 62.0 in | Wt 140.0 lb

## 2024-01-30 DIAGNOSIS — Z9181 History of falling: Secondary | ICD-10-CM | POA: Diagnosis not present

## 2024-01-30 DIAGNOSIS — R200 Anesthesia of skin: Secondary | ICD-10-CM | POA: Diagnosis not present

## 2024-01-30 DIAGNOSIS — R202 Paresthesia of skin: Secondary | ICD-10-CM

## 2024-01-30 NOTE — Progress Notes (Signed)
 Subjective:  Patient ID: Angela Parrish, female    DOB: Jan 27, 1941, 83 y.o.   MRN: [id number]  Patient Care Team: Eliodoro Guerin, DO as PCP - General (Family Medicine) Ruby Corporal, MD (Inactive) as Consulting Physician (Gastroenterology) Liliane Rei, MD as Consulting Physician (Orthopedic Surgery) Nicolas Barren, MD as Consulting Physician (Nephrology) Dorthey Gave, PA-C as Physician Assistant (Dermatology) Hyland Mailman, MD as Referring Physician (Optometry)   Chief Complaint:  Numbness (Left thumb/hand/wrist/Left jaw)  HPI: Angela Parrish is a 83 y.o. female presenting on 01/30/2024 for Numbness (Left thumb/hand/wrist/Left jaw)  HPI States that she had prolia  shot on past Friday, believes it to be her 4th.  States that on Saturday she started having numbness in left hand that moved up to her left arm. States that on Saturday numbness moved up to her jaw as well.  States that it goes away and then returns. States that she feels much better now.  Denies any weakness, chest pain, shortness of breath, slurring of words, confusion.  States that her left thumb was still slightly numb this morning. Reports that her jaw was last numb on Sunday morning.  Of note, she fell two weeks ago and hit her left knee and right shoulder. She was not seen after fall.    Relevant past medical, surgical, family, and social history reviewed and updated as indicated.  Allergies and medications reviewed and updated. Data reviewed: Chart in Epic.   Past Medical History:  Diagnosis Date   Abnormal Pap smear    Previous colposcopy   Abnormal Pap smear of cervix 06/29/2011   Acute pulmonary embolism (HCC) 09/21/2018   Arthritis    hip    Asthma    childhood     Basal cell carcinoma 07/08/2001   Right Forehead (curet, excision)   Breast cancer (HCC) 2000   Status post left lumpectomy   Closed fracture of third toe of right foot 03/13/2017   COPD (chronic obstructive  pulmonary disease) (HCC)    Dyspnea    on exertion ; improved with use Spiriva     Headache    occ   Hyperlipidemia    Nonintractable headache 10/08/2017   Personal history of radiation therapy 2000   Right leg DVT (HCC) 09/22/2018   TIA (transient ischemic attack) 12/16/2021    Past Surgical History:  Procedure Laterality Date   BREAST LUMPECTOMY Left 2000   CATARACT EXTRACTION, BILATERAL     COLONOSCOPY N/A 09/16/2014   Procedure: COLONOSCOPY;  Surgeon: Ruby Corporal, MD;  Location: [facility];  Service: Endoscopy;  Laterality: N/A;  830 - moved to 9:15 - Ann to notify   JOINT REPLACEMENT  06/2018   right hip   KNEE CARTILAGE SURGERY Right    TOTAL HIP ARTHROPLASTY Right 07/17/2018   Procedure: RIGHT TOTAL HIP ARTHROPLASTY ANTERIOR APPROACH;  Surgeon: Liliane Rei, MD;  Location: [facility];  Service: Orthopedics;  Laterality: Right;     Social History   Socioeconomic History   Marital status: Significant Other    Spouse name: Olinda Bertrand   Number of children: 1   Years of education: 12   Highest education level: Some college, no degree  Occupational History   Occupation: Retired    Comment: [employer]  Tobacco Use   Smoking status: Former    Current packs/day: 0.00    Types: Cigarettes    Quit date: 08/14/2007    Years since quitting: 16.4   Smokeless tobacco:  Never  Vaping Use   Vaping status: Never Used  Substance and Sexual Activity   Alcohol use: No   Drug use: No   Sexual activity: Yes    Birth control/protection: Post-menopausal  Other Topics Concern   Not on file  Social History Narrative   Retired from [employer] age 61   She has 1 daughter who is 90 years old and lives in [place])   She has 2 grandsons age 2 and 29.   She has a significant other, IT sales professional.     No pets   She loves crafting and thrift stores   Social Drivers of Health   Financial Resource Strain: Low Risk  (02/19/2023)   Overall Financial Resource Strain  (CARDIA)    Difficulty of Paying Living Expenses: Not hard at all  Food Insecurity: No Food Insecurity (02/19/2023)   Hunger Vital Sign    Worried About Running Out of Food in the Last Year: Never true    Ran Out of Food in the Last Year: Never true  Transportation Needs: No Transportation Needs (02/17/2022)   PRAPARE - Administrator, Civil Service (Medical): No    Lack of Transportation (Non-Medical): No  Physical Activity: Insufficiently Active (02/19/2023)   Exercise Vital Sign    Days of Exercise per Week: 3 days    Minutes of Exercise per Session: 30 min  Stress: No Stress Concern Present (02/19/2023)   [employer] of Occupational Health - Occupational Stress Questionnaire    Feeling of Stress : Not at all  Social Connections: Moderately Integrated (02/19/2023)   Social Connection and Isolation Panel [NHANES]    Frequency of Communication with Friends and Family: More than three times a week    Frequency of Social Gatherings with Friends and Family: More than three times a week    Attends Religious Services: More than 4 times per year    Active Member of [employer] or Organizations: Yes    Attends Banker Meetings: More than 4 times per year    Marital Status: Widowed  Intimate Partner Violence: Not At Risk (02/19/2023)   Humiliation, Afraid, Rape, and Kick questionnaire    Fear of Current or Ex-Partner: No    Emotionally Abused: No    Physically Abused: No    Sexually Abused: No    Outpatient Encounter Medications as of 01/30/2024  Medication Sig   acetaminophen  (TYLENOL ) 325 MG tablet Take 2 tablets (650 mg total) by mouth every 6 (six) hours as needed for mild pain (or Fever >/= 101).   albuterol  (VENTOLIN  HFA) 108 (90 Base) MCG/ACT inhaler INHALE 2 PUFFS INTO THE LUNGS EVERY 6 HOURS AS NEEDED FOR WHEEZING OR SHORTNESS OF BREATH.   aspirin  EC 81 MG EC tablet Take 1 tablet (81 mg total) by mouth daily. Swallow whole.   atorvastatin  (LIPITOR) 10 MG tablet  Take 1 tablet (10 mg total) by mouth daily.   busPIRone  (BUSPAR ) 5 MG tablet Take 1 tablet (5 mg total) by mouth 2 (two) times daily as needed (anxiety).   empagliflozin (JARDIANCE) 10 MG TABS tablet Take by mouth daily.   Fluticasone -Umeclidin-Vilant (TRELEGY ELLIPTA ) 200-62.5-25 MCG/ACT AEPB INHALE 1 PUFF EVERY DAY   levalbuterol  (XOPENEX ) 0.63 MG/3ML nebulizer solution Take 3 mLs (0.63 mg total) by nebulization every 6 (six) hours as needed for wheezing or shortness of breath.   Facility-Administered Encounter Medications as of 01/30/2024  Medication   denosumab  (PROLIA ) injection 60 mg    No Known Allergies  Review of Systems As per HPI  Objective:  BP 125/71   Pulse 77   Temp 98 F (36.7 C)   Ht 5' 2 (1.575 m)   Wt 140 lb (63.5 kg)   SpO2 99%   BMI 25.61 kg/m    Wt Readings from Last 3 Encounters:  01/30/24 140 lb (63.5 kg)  01/09/24 141 lb (64 kg)  07/11/23 143 lb (64.9 kg)    Physical Exam Constitutional:      General: She is awake. She is not in acute distress.    Appearance: Normal appearance. She is well-developed and well-groomed. She is not ill-appearing, toxic-appearing or diaphoretic.  Eyes:     General: No visual field deficit. Cardiovascular:     Rate and Rhythm: Normal rate and regular rhythm.     Pulses: Normal pulses.          Radial pulses are 2+ on the right side and 2+ on the left side.       Posterior tibial pulses are 2+ on the right side and 2+ on the left side.     Heart sounds: Normal heart sounds. No murmur heard.    No gallop.  Pulmonary:     Effort: Pulmonary effort is normal. No respiratory distress.     Breath sounds: Normal breath sounds. No stridor. No wheezing, rhonchi or rales.  Musculoskeletal:     Cervical back: Full passive range of motion without pain and neck supple.     Right lower leg: No edema.     Left lower leg: 1+ Edema present.  Skin:    General: Skin is warm.     Capillary Refill: Capillary refill takes less than 2  seconds.     Findings: Bruising, ecchymosis and petechiae present.          Comments: Ecchymosis in various stages of healing, yellow, purple, blue, petechial   Neurological:     General: No focal deficit present.     Mental Status: She is alert, oriented to person, place, and time and easily aroused. Mental status is at baseline.     GCS: GCS eye subscore is 4. GCS verbal subscore is 5. GCS motor subscore is 6.     Cranial Nerves: Cranial nerves 2-12 are intact. No cranial nerve deficit, dysarthria or facial asymmetry.     Sensory: No sensory deficit.     Motor: No weakness, tremor, atrophy, abnormal muscle tone or pronator drift.     Coordination: Romberg sign negative. Coordination normal. Finger-Nose-Finger Test and Heel to [facility] Test normal.     Gait: Gait and tandem walk normal.  Psychiatric:        Attention and Perception: Attention and perception normal.        Mood and Affect: Mood and affect normal.        Speech: Speech normal.        Behavior: Behavior normal. Behavior is cooperative.        Thought Content: Thought content normal. Thought content does not include homicidal or suicidal ideation. Thought content does not include homicidal or suicidal plan.        Cognition and Memory: Cognition and memory normal.        Judgment: Judgment normal.     Results for orders placed or performed in visit on 01/09/24  Bayer DCA Hb A1c Waived   Collection Time: 01/09/24 11:18 AM  Result Value Ref Range   HB A1C (BAYER DCA - WAIVED) 5.4 4.8 - 5.6 %  VITAMIN D  25 Hydroxy (Vit-D Deficiency, Fractures)   Collection Time: 01/09/24  2:09 PM  Result Value Ref Range   Vit D, 25-Hydroxy 36.9 30.0 - 100.0 ng/mL  CMP14+EGFR   Collection Time: 01/09/24  2:09 PM  Result Value Ref Range   Glucose 86 70 - 99 mg/dL   BUN 38 (H) 8 - 27 mg/dL   Creatinine, Ser 9.60 (H) 0.57 - 1.00 mg/dL   eGFR 19 (L) >45 WU/JWJ/1.91   BUN/Creatinine Ratio 15 12 - 28   Sodium 141 134 - 144 mmol/L    Potassium 4.7 3.5 - 5.2 mmol/L   Chloride 101 96 - 106 mmol/L   CO2 22 20 - 29 mmol/L   Calcium  10.4 (H) 8.7 - 10.3 mg/dL   Total Protein 7.4 6.0 - 8.5 g/dL   Albumin 4.6 3.7 - 4.7 g/dL   Globulin, Total 2.8 1.5 - 4.5 g/dL   Bilirubin Total 0.4 0.0 - 1.2 mg/dL   Alkaline Phosphatase 87 44 - 121 IU/L   AST 26 0 - 40 IU/L   ALT 17 0 - 32 IU/L  CBC   Collection Time: 01/09/24  2:09 PM  Result Value Ref Range   WBC 7.2 3.4 - 10.8 x10E3/uL   RBC 4.27 3.77 - 5.28 x10E6/uL   Hemoglobin 13.5 11.1 - 15.9 g/dL   Hematocrit 47.8 29.5 - 46.6 %   MCV 96 79 - 97 fL   MCH 31.6 26.6 - 33.0 pg   MCHC 33.0 31.5 - 35.7 g/dL   RDW 62.1 30.8 - 65.7 %   Platelets 223 150 - 450 x10E3/uL  Lipid Panel   Collection Time: 01/09/24  2:09 PM  Result Value Ref Range   Cholesterol, Total 198 100 - 199 mg/dL   Triglycerides 67 0 - 149 mg/dL   HDL 89 >84 mg/dL   VLDL Cholesterol Cal 12 5 - 40 mg/dL   LDL Chol Calc (NIH) 97 0 - 99 mg/dL   Chol/HDL Ratio 2.2 0.0 - 4.4 ratio       01/30/2024   11:15 AM 01/09/2024   10:30 AM 07/11/2023    8:13 AM 02/19/2023    2:49 PM 01/02/2023    8:07 AM  Depression screen PHQ 2/9  Decreased Interest 0 0 0 0 0  Down, Depressed, Hopeless 0 0 0 0 0  PHQ - 2 Score 0 0 0 0 0  Altered sleeping  0 0 0 2  Tired, decreased energy  0 0 0 2  Change in appetite  0 0 0 0  Feeling bad or failure about yourself   0 0 0 0  Trouble concentrating  0 0 0 0  Moving slowly or fidgety/restless  0 0 0 0  Suicidal thoughts  0 0 0 0  PHQ-9 Score  0 0 0 4  Difficult doing work/chores  Not difficult at all Not difficult at all Not difficult at all Not difficult at all       01/30/2024   11:15 AM 01/09/2024   10:30 AM 07/11/2023    8:13 AM 01/02/2023    8:07 AM  GAD 7 : Generalized Anxiety Score  Nervous, Anxious, on Edge 0 0 0 0  Control/stop worrying 0 0 0 0  Worry too much - different things 0 0 0 0  Trouble relaxing 0 0 0 0  Restless 0 0 0 0  Easily annoyed or irritable 0 0 0 0   Afraid - awful might happen 0 0 0 0  Total GAD 7 Score 0 0 0 0  Anxiety Difficulty Not difficult at all Not difficult at all Not difficult at all Not difficult at all      Pertinent labs & imaging results that were available during my care of the patient were reviewed by me and considered in my medical decision making.  Assessment & Plan:  Anwar was seen today for numbness.  Diagnoses and all orders for this visit: 1. Numbness and tingling (Primary) Reassuring neuro exam. Discussed with patient that possibly reaction to prolia , vitamin deficiency, or nerve impingement from recent fall. Recent labs unremarkable. Patient declined xray of cervical spine. Discussed at length red flag symptoms with patient. As she is improving, will continue to monitor and have her follow up if symptoms worsen.   2. History of recent fall As she is improving, will continue to monitor and have her follow up if symptoms worsen.   Continue all other maintenance medications.  Follow up plan: Return if symptoms worsen or fail to improve.   Continue healthy lifestyle choices, including diet (rich in fruits, vegetables, and lean proteins, and low in salt and simple carbohydrates) and exercise (at least 30 minutes of moderate physical activity daily).  Written and verbal instructions provided   The above assessment and management plan was discussed with the patient. The patient verbalized understanding of and has agreed to the management plan. Patient is aware to call the clinic if they develop any new symptoms or if symptoms persist or worsen. Patient is aware when to return to the clinic for a follow-up visit. Patient educated on when it is appropriate to go to the emergency department.   Jacqualyn Mates, DNP-FNP [facility] [address] [place], [place] [address] [phone]

## 2024-01-30 NOTE — Telephone Encounter (Signed)
 Called pt to make sure she is doing better/ not having stroke symptoms. Appointment already scheduled for today but wanting to make sure ER is not correct course of action. LS

## 2024-01-31 NOTE — Telephone Encounter (Signed)
 Patient assessed and treated by Jacqualyn Mates today.

## 2024-02-20 ENCOUNTER — Ambulatory Visit: Payer: Medicare HMO

## 2024-02-20 VITALS — BP 125/71 | HR 77 | Ht 62.0 in | Wt 140.0 lb

## 2024-02-20 DIAGNOSIS — Z0001 Encounter for general adult medical examination with abnormal findings: Secondary | ICD-10-CM | POA: Diagnosis not present

## 2024-02-20 DIAGNOSIS — N184 Chronic kidney disease, stage 4 (severe): Secondary | ICD-10-CM

## 2024-02-20 DIAGNOSIS — Z0000 Encounter for general adult medical examination without abnormal findings: Secondary | ICD-10-CM

## 2024-02-20 NOTE — Progress Notes (Signed)
 Subjective:   Angela Parrish is a 83 y.o. who presents for a Medicare Wellness preventive visit.  As a reminder, Annual Wellness Visits don't include a physical exam, and some assessments may be limited, especially if this visit is performed virtually. We may recommend an in-person follow-up visit with your provider if needed.  Visit Complete: Virtual I connected with  Angela Parrish on 02/20/24 by a audio enabled telemedicine application and verified that I am speaking with the correct person using two identifiers.  Patient Location: Home  Provider Location: Home Office  I discussed the limitations of evaluation and management by telemedicine. The patient expressed understanding and agreed to proceed.  Vital Signs: Because this visit was a virtual/telehealth visit, some criteria may be missing or patient reported. Any vitals not documented were not able to be obtained and vitals that have been documented are patient reported.  VideoDeclined- This patient declined Librarian, academic. Therefore the visit was completed with audio only.  Persons Participating in Visit: Patient.  AWV Questionnaire: No: Patient Medicare AWV questionnaire was not completed prior to this visit.  Cardiac Risk Factors include: advanced age (>7men, >24 women);dyslipidemia;hypertension     Objective:    Today's Vitals   02/20/24 1313  BP: 125/71  Pulse: 77  Weight: 140 lb (63.5 kg)  Height: 5' 2 (1.575 m)   Body mass index is 25.61 kg/m.     02/20/2024    1:18 PM 02/19/2023    2:51 PM 02/17/2022    9:13 AM 12/16/2021    8:00 PM 05/19/2019    8:29 AM 09/22/2018    2:33 PM 09/21/2018    4:56 PM  Advanced Directives  Does Patient Have a Medical Advance Directive? Yes Yes Yes No Yes No  Yes   Type of Estate agent of [place];Living will Healthcare Power of [place];Living will Healthcare Power of [place];Living will  Healthcare Power of [place];Living  will Healthcare Power of Attorney Living will  Does patient want to make changes to medical advance directive?  No - Patient declined   No - Patient declined No - Patient declined  No - Patient declined   Copy of Healthcare Power of Attorney in Chart? Yes - validated most recent copy scanned in chart (See row information) Yes - validated most recent copy scanned in chart (See row information) Yes - validated most recent copy scanned in chart (See row information)  Yes - validated most recent copy scanned in chart (See row information) No - copy requested  No - copy requested   Would patient like information on creating a medical advance directive?    No - Patient declined  No - Patient declined  No - Patient declined      Data saved with a previous flowsheet row definition    Current Medications (verified) Outpatient Encounter Medications as of 02/20/2024  Medication Sig   acetaminophen  (TYLENOL ) 325 MG tablet Take 2 tablets (650 mg total) by mouth every 6 (six) hours as needed for mild pain (or Fever >/= 101).   albuterol  (VENTOLIN  HFA) 108 (90 Base) MCG/ACT inhaler INHALE 2 PUFFS INTO THE LUNGS EVERY 6 HOURS AS NEEDED FOR WHEEZING OR SHORTNESS OF BREATH.   aspirin  EC 81 MG EC tablet Take 1 tablet (81 mg total) by mouth daily. Swallow whole.   atorvastatin  (LIPITOR) 10 MG tablet Take 1 tablet (10 mg total) by mouth daily.   busPIRone  (BUSPAR ) 5 MG tablet Take 1 tablet (5 mg total)  by mouth 2 (two) times daily as needed (anxiety).   empagliflozin (JARDIANCE) 10 MG TABS tablet Take by mouth daily.   Fluticasone -Umeclidin-Vilant (TRELEGY ELLIPTA ) 200-62.5-25 MCG/ACT AEPB INHALE 1 PUFF EVERY DAY   levalbuterol  (XOPENEX ) 0.63 MG/3ML nebulizer solution Take 3 mLs (0.63 mg total) by nebulization every 6 (six) hours as needed for wheezing or shortness of breath.   Facility-Administered Encounter Medications as of 02/20/2024  Medication   denosumab  (PROLIA ) injection 60 mg    Allergies  (verified) Patient has no known allergies.   History: Past Medical History:  Diagnosis Date   Abnormal Pap smear    Previous colposcopy   Abnormal Pap smear of cervix 06/29/2011   Acute pulmonary embolism (HCC) 09/21/2018   Arthritis    hip    Asthma    childhood     Basal cell carcinoma 07/08/2001   Right Forehead (curet, excision)   Breast cancer (HCC) 2000   Status post left lumpectomy   Closed fracture of third toe of right foot 03/13/2017   COPD (chronic obstructive pulmonary disease) (HCC)    Dyspnea    on exertion ; improved with use Spiriva     Headache    occ   Hyperlipidemia    Nonintractable headache 10/08/2017   Personal history of radiation therapy 2000   Right leg DVT (HCC) 09/22/2018   TIA (transient ischemic attack) 12/16/2021   Past Surgical History:  Procedure Laterality Date   BREAST LUMPECTOMY Left 2000   CATARACT EXTRACTION, BILATERAL     COLONOSCOPY N/A 09/16/2014   Procedure: COLONOSCOPY;  Surgeon: Claudis RAYMOND Rivet, MD;  Location: [facility];  Service: Endoscopy;  Laterality: N/A;  830 - moved to 9:15 - Ann to notify   JOINT REPLACEMENT  06/2018   right hip   KNEE CARTILAGE SURGERY Right    TOTAL HIP ARTHROPLASTY Right 07/17/2018   Procedure: RIGHT TOTAL HIP ARTHROPLASTY ANTERIOR APPROACH;  Surgeon: Melodi Lerner, MD;  Location: [facility];  Service: Orthopedics;  Laterality: Right;    Family History  Problem Relation Age of Onset   Heart disease Father    Lung cancer Father        smoker   Heart attack Father    Colon cancer Mother 62   Heart attack Mother    Heart disease Sister    Hypertension Brother    Heart attack Brother    Leukemia Brother    Obesity Daughter    Anxiety disorder Daughter    Hypertension Brother    Social History   Socioeconomic History   Marital status: Significant Other    Spouse name: Arley Silva   Number of children: 1   Years of education: 12   Highest education level: Some college, no degree   Occupational History   Occupation: Retired    Comment: [employer]  Tobacco Use   Smoking status: Former    Current packs/day: 0.00    Types: Cigarettes    Quit date: 08/14/2007    Years since quitting: 16.5   Smokeless tobacco: Never  Vaping Use   Vaping status: Never Used  Substance and Sexual Activity   Alcohol use: No   Drug use: No   Sexual activity: Yes    Birth control/protection: Post-menopausal  Other Topics Concern   Not on file  Social History Narrative   Retired from [employer] age 41   She has 1 daughter who is 85 years old and lives in [place])   She has 2 grandsons age  29 and 31.   She has a significant other, IT sales professional.     No pets   She loves crafting and thrift stores   Social Drivers of Health   Financial Resource Strain: Low Risk  (02/20/2024)   Overall Financial Resource Strain (CARDIA)    Difficulty of Paying Living Expenses: Not hard at all  Food Insecurity: No Food Insecurity (02/20/2024)   Hunger Vital Sign    Worried About Running Out of Food in the Last Year: Never true    Ran Out of Food in the Last Year: Never true  Transportation Needs: No Transportation Needs (02/20/2024)   PRAPARE - Administrator, Civil Service (Medical): No    Lack of Transportation (Non-Medical): No  Physical Activity: Insufficiently Active (02/20/2024)   Exercise Vital Sign    Days of Exercise per Week: 3 days    Minutes of Exercise per Session: 30 min  Stress: No Stress Concern Present (02/20/2024)   [employer] of Occupational Health - Occupational Stress Questionnaire    Feeling of Stress: Not at all  Social Connections: Moderately Integrated (02/20/2024)   Social Connection and Isolation Panel    Frequency of Communication with Friends and Family: More than three times a week    Frequency of Social Gatherings with Friends and Family: More than three times a week    Attends Religious Services: More than 4 times per year    Active Member of [employer]  or Organizations: Yes    Attends Banker Meetings: More than 4 times per year    Marital Status: Widowed    Tobacco Counseling Counseling given: Yes    Clinical Intake:  Pre-visit preparation completed: Yes  Pain : No/denies pain     BMI - recorded: 25.61 Nutritional Status: BMI of 19-24  Normal Nutritional Risks: None Diabetes: No  Lab Results  Component Value Date   HGBA1C 5.4 01/09/2024   HGBA1C 5.5 01/02/2023   HGBA1C 5.7 (H) 12/16/2021     How often do you need to have someone help you when you read instructions, pamphlets, or other written materials from your doctor or pharmacy?: 1 - Never  Interpreter Needed?: No  Information entered by :: alia t/cma   Activities of Daily Living     02/20/2024    1:16 PM  In your present state of health, do you have any difficulty performing the following activities:  Hearing? 0  Vision? 0  Comment pt denies vision dif/[facility] in [place], [place]/last ov 8/24  Difficulty concentrating or making decisions? 0  Walking or climbing stairs? 0  Dressing or bathing? 0  Doing errands, shopping? 0  Preparing Food and eating ? N  Using the Toilet? N  In the past six months, have you accidently leaked urine? N  Do you have problems with loss of bowel control? N  Managing your Medications? N  Managing your Finances? N  Housekeeping or managing your Housekeeping? N    Patient Care Team: Jolinda Norene HERO, DO as PCP - General (Family Medicine) Golda Claudis PENNER, MD (Inactive) as Consulting Physician (Gastroenterology) Melodi Lerner, MD as Consulting Physician (Orthopedic Surgery) Prescilla Beams, MD as Consulting Physician (Nephrology) Porter Andrez SAUNDERS, PA-C as Physician Assistant (Dermatology) Ladora Ross Lacy Phebe, MD as Referring Physician (Optometry)  I have updated your Care Teams any recent Medical Services you may have received from other providers in the past year.     Assessment:   This is a  routine  wellness examination for Laurie.  Hearing/Vision screen Hearing Screening - Comments:: Pt denies hearing dif Vision Screening - Comments:: pt denies vision dif/[facility] [place], [place]/last ov 8/24   Goals Addressed             This Visit's Progress    Patient Stated       Wants to the mt for shopping/clean house from top to bottom/go to the beach for a beach       Depression Screen     02/20/2024    1:20 PM 01/30/2024   11:15 AM 01/09/2024   10:30 AM 07/11/2023    8:13 AM 02/19/2023    2:49 PM 01/02/2023    8:07 AM 09/05/2022   12:46 PM  PHQ 2/9 Scores  PHQ - 2 Score 0 0 0 0 0 0 0  PHQ- 9 Score 0  0 0 0 4 0    Fall Risk     02/20/2024    1:15 PM 01/30/2024   11:15 AM 01/09/2024   10:31 AM 07/11/2023    8:13 AM 02/19/2023    2:46 PM  Fall Risk   Falls in the past year? 1 1 0 0 0  Number falls in past yr: 0 0 0 0 0  Injury with Fall? 0 1 0 0 0  Risk for fall due to : Impaired balance/gait;Impaired mobility No Fall Risks No Fall Risks No Fall Risks No Fall Risks  Follow up  Falls evaluation completed Falls evaluation completed Education provided Falls prevention discussed    MEDICARE RISK AT HOME:  Medicare Risk at Home Any stairs in or around the home?: Yes If so, are there any without handrails?: Yes Home free of loose throw rugs in walkways, pet beds, electrical cords, etc?: Yes Adequate lighting in your home to reduce risk of falls?: Yes Life alert?: No Use of a cane, walker or w/c?: No Grab bars in the bathroom?: No Shower chair or bench in shower?: No Elevated toilet seat or a handicapped toilet?: Yes  TIMED UP AND GO:  Was the test performed?  no  Cognitive Function: 6CIT completed    05/17/2018    9:41 AM 11/22/2016    8:43 AM  MMSE - Mini Mental State Exam  Orientation to time 5 5   Orientation to Place 5 5   Registration 3 3   Attention/ Calculation 5 5   Recall 2 3   Language- name 2 objects 2 2   Language- repeat 1 1  Language-  follow 3 step command 3 3   Language- read & follow direction 1 1   Write a sentence 1 1   Copy design 1 1   Total score 29 30      Data saved with a previous flowsheet row definition        02/20/2024    1:23 PM 02/19/2023    2:52 PM 02/17/2022    9:13 AM 03/03/2021    8:31 AM 05/19/2019    8:36 AM  6CIT Screen  What Year? 0 points 0 points 0 points 0 points 0 points  What month? 0 points 0 points 0 points 0 points 0 points  What time? 0 points 0 points 0 points 0 points 0 points  Count back from 20 0 points 0 points 0 points 0 points 0 points  Months in reverse 0 points 0 points 0 points 0 points 0 points  Repeat phrase 0 points 0 points 0 points 0 points  2 points  Total Score 0 points 0 points 0 points 0 points 2 points    Immunizations Immunization History  Administered Date(s) Administered   Fluad Quad(high Dose 65+) 05/05/2019, 05/26/2020, 05/23/2021, 07/03/2022   Influenza, High Dose Seasonal PF 07/11/2016, 06/13/2017, 05/17/2018   Moderna Sars-Covid-2 Vaccination 10/21/2019, 11/18/2019, 07/06/2020, 03/01/2021   PFIZER Comirnaty(Gray Top)Covid-19 Tri-Sucrose Vaccine 07/05/2021   Pneumococcal Conjugate-13 06/20/2017   Pneumococcal Polysaccharide-23 06/25/2014   Tdap 11/22/2016   Zoster Recombinant(Shingrix ) 08/31/2021, 02/23/2022   Zoster, Live 09/19/2008    Screening Tests Health Maintenance  Topic Date Due   COVID-19 Vaccine (6 - 2024-25 season) 03/07/2025 (Originally 04/22/2023)   INFLUENZA VACCINE  03/21/2024   MAMMOGRAM  06/06/2024   DEXA SCAN  01/10/2025   Medicare Annual Wellness (AWV)  02/19/2025   DTaP/Tdap/Td (2 - Td or Tdap) 11/23/2026   Pneumococcal Vaccine: 50+ Years  Completed   Zoster Vaccines- Shingrix   Completed   Hepatitis B Vaccines  Aged Out   HPV VACCINES  Aged Out   Meningococcal B Vaccine  Aged Out    Health Maintenance  There are no preventive care reminders to display for this patient.  Health Maintenance Items Addressed: See Nurse  Notes at the end of this note  Additional Screening:  Vision Screening: Recommended annual ophthalmology exams for early detection of glaucoma and other disorders of the eye. Would you like a referral to an eye doctor? No    Dental Screening: Recommended annual dental exams for proper oral hygiene  Community Resource Referral / Chronic Care Management: CRR required this visit?  No   CCM required this visit?  No   Plan:    I have personally reviewed and noted the following in the patient's chart:   Medical and social history Use of alcohol, tobacco or illicit drugs  Current medications and supplements including opioid prescriptions. Patient is not currently taking opioid prescriptions. Functional ability and status Nutritional status Physical activity Advanced directives List of other physicians Hospitalizations, surgeries, and ER visits in previous 12 months Vitals Screenings to include cognitive, depression, and falls Referrals and appointments  In addition, I have reviewed and discussed with patient certain preventive protocols, quality metrics, and best practice recommendations. A written personalized care plan for preventive services as well as general preventive health recommendations were provided to patient.   Ozie Ned, CMA   02/20/2024   After Visit Summary: (MyChart) Due to this being a telephonic visit, the after visit summary with patients personalized plan was offered to patient via MyChart   Notes: Nothing significant to report at this time.

## 2024-02-20 NOTE — Patient Instructions (Signed)
 Ms. Brenner , Thank you for taking time out of your busy schedule to complete your Annual Wellness Visit with me. I enjoyed our conversation and look forward to speaking with you again next year. I, as well as your care team,  appreciate your ongoing commitment to your health goals. Please review the following plan we discussed and let me know if I can assist you in the future. Your Game plan/ To Do List    Follow up Visits: Next Medicare AWV with our clinical staff: 02/23/25 at 12:30p.m.   Next Office Visit with your provider: 07/11/24 at 11:15a.m.  Clinician Recommendations:  Aim for 30 minutes of exercise or brisk walking, 6-8 glasses of water , and 5 servings of fruits and vegetables each day.       This is a list of the screening recommended for you and due dates:  Health Maintenance  Topic Date Due   Medicare Annual Wellness Visit  02/19/2024   COVID-19 Vaccine (6 - 2024-25 season) 03/07/2025*   Flu Shot  03/21/2024   Mammogram  06/06/2024   DEXA scan (bone density measurement)  01/10/2025   DTaP/Tdap/Td vaccine (2 - Td or Tdap) 11/23/2026   Pneumococcal Vaccine for age over 23  Completed   Zoster (Shingles) Vaccine  Completed   Hepatitis B Vaccine  Aged Out   HPV Vaccine  Aged Out   Meningitis B Vaccine  Aged Out  *Topic was postponed. The date shown is not the original due date.    Advanced directives: (In Chart) A copy of your advanced directives are scanned into your chart should your provider ever need it. Advance Care Planning is important because it:  [x]  Makes sure you receive the medical care that is consistent with your values, goals, and preferences  [x]  It provides guidance to your family and loved ones and reduces their decisional burden about whether or not they are making the right decisions based on your wishes.  Follow the link provided in your after visit summary or read over the paperwork we have mailed to you to help you started getting your Advance Directives  in place. If you need assistance in completing these, please reach out to us  so that we can help you!  See attachments for Preventive Care and Fall Prevention Tips.

## 2024-03-10 DIAGNOSIS — N184 Chronic kidney disease, stage 4 (severe): Secondary | ICD-10-CM | POA: Diagnosis not present

## 2024-03-18 DIAGNOSIS — N184 Chronic kidney disease, stage 4 (severe): Secondary | ICD-10-CM | POA: Diagnosis not present

## 2024-03-18 DIAGNOSIS — I129 Hypertensive chronic kidney disease with stage 1 through stage 4 chronic kidney disease, or unspecified chronic kidney disease: Secondary | ICD-10-CM | POA: Diagnosis not present

## 2024-03-18 DIAGNOSIS — N2581 Secondary hyperparathyroidism of renal origin: Secondary | ICD-10-CM | POA: Diagnosis not present

## 2024-04-09 ENCOUNTER — Encounter: Payer: Self-pay | Admitting: Nutrition

## 2024-04-09 ENCOUNTER — Encounter: Attending: Family Medicine | Admitting: Nutrition

## 2024-04-09 VITALS — Ht 62.0 in | Wt 141.0 lb

## 2024-04-09 DIAGNOSIS — N184 Chronic kidney disease, stage 4 (severe): Secondary | ICD-10-CM | POA: Insufficient documentation

## 2024-04-09 NOTE — Patient Instructions (Addendum)
 Goals You're dong a great job! Drink 40 oz of water  per day. Cut back on salt intake Work on whole wheat bread for fiber since your phosphorous is low. Cut back on sugar in tea and switch to water  Read food labels Talk to Dr. Lovie about pros and cons of getting rid of cholesterol medication. Follow kidney guides as discussed.

## 2024-04-09 NOTE — Progress Notes (Signed)
 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  Sees [facility]. PCP Dr. Zita, Keeps a close watch on her BP daily. BP 110/66 mg/dl this am. Is on Jardiance. BMI 25. Wt has been fairly stable for the last few months. Eats mostly at home Reads food labels and checks on the sodium and potassium content of her foods. eGFR 19-21 has been stable per Nephrologist. Creatine 2.47 mg/dl. A1C improved to 5.4% from 6%. Active for her age. No physical limitations with walking or ADL's.  She is willing to work on being more mindful of following a kidney friendly diet.Angela Parrish Has never been referred to a dietitian before for her kidneys. Wants to avoid dialysis.  Clinical Medical Hx:  Past Medical History:  Diagnosis Date   Abnormal Pap smear    Previous colposcopy   Abnormal Pap smear of cervix 06/29/2011   Acute pulmonary embolism (HCC) 09/21/2018   Arthritis    hip    Asthma    childhood     Basal cell carcinoma 07/08/2001   Right Forehead (curet, excision)   Breast cancer (HCC) 2000   Status post left lumpectomy   Closed fracture of third toe of right foot 03/13/2017   COPD (chronic obstructive pulmonary disease) (HCC)    Dyspnea    on exertion ; improved with use Spiriva     Headache    occ   Hyperlipidemia    Nonintractable headache 10/08/2017   Personal history of radiation therapy 2000   Right leg DVT (HCC) 09/22/2018   TIA (transient ischemic attack) 12/16/2021    Medications:  Current Outpatient Medications on File Prior to Visit  Medication Sig Dispense Refill   acetaminophen  (TYLENOL ) 325 MG tablet Take 2 tablets (650 mg total) by mouth every 6 (six) hours as needed for mild pain (or Fever >/= 101).     albuterol  (VENTOLIN  HFA) 108  (90 Base) MCG/ACT inhaler INHALE 2 PUFFS INTO THE LUNGS EVERY 6 HOURS AS NEEDED FOR WHEEZING OR SHORTNESS OF BREATH. 18 each 0   aspirin  EC 81 MG EC tablet Take 1 tablet (81 mg total) by mouth daily. Swallow whole. 30 tablet 11   atorvastatin  (LIPITOR) 10 MG tablet Take 1 tablet (10 mg total) by mouth daily. 90 tablet 3   busPIRone  (BUSPAR ) 5 MG tablet Take 1 tablet (5 mg total) by mouth 2 (two) times daily as needed (anxiety). 60 tablet 0   empagliflozin (JARDIANCE) 10 MG TABS tablet Take by mouth daily.     Fluticasone -Umeclidin-Vilant (TRELEGY ELLIPTA ) 200-62.5-25 MCG/ACT AEPB INHALE 1 PUFF EVERY DAY 180 each 3   levalbuterol  (XOPENEX ) 0.63 MG/3ML nebulizer solution Take 3 mLs (0.63 mg total) by nebulization every 6 (six) hours as needed for wheezing or shortness of breath.     Current Facility-Administered Medications on File Prior to Visit  Medication Dose Route Frequency Provider Last Rate Last Admin   denosumab  (PROLIA ) injection 60 mg  60 mg Subcutaneous Q6 months Jolinda Potter M, DO   60 mg at 01/12/23 0830    Labs:     Latest Ref Rng & Units 01/09/2024    2:09 PM 01/02/2023    8:18 AM 12/16/2021    5:11 PM  CMP  Glucose 70 - 99 mg/dL 86  87  98  BUN 8 - 27 mg/dL 38  33  33   Creatinine 0.57 - 1.00 mg/dL 7.52  7.76  8.21   Sodium 134 - 144 mmol/L 141  142  139   Potassium 3.5 - 5.2 mmol/L 4.7  4.8  5.1   Chloride 96 - 106 mmol/L 101  104  109   CO2 20 - 29 mmol/L 22  22  23    Calcium  8.7 - 10.3 mg/dL 89.5  89.2  8.7   Total Protein 6.0 - 8.5 g/dL 7.4  7.3  7.8   Total Bilirubin 0.0 - 1.2 mg/dL 0.4  0.4  0.5   Alkaline Phos 44 - 121 IU/L 87  97  73   AST 0 - 40 IU/L 26  26  26    ALT 0 - 32 IU/L 17  21  21     Lipid Panel     Component Value Date/Time   CHOL 198 01/09/2024 1409   TRIG 67 01/09/2024 1409   HDL 89 01/09/2024 1409   CHOLHDL 2.2 01/09/2024 1409   CHOLHDL 2.7 12/17/2021 0438   VLDL 21 12/17/2021 0438   LDLCALC 97 01/09/2024 1409   LABVLDL 12 01/09/2024  1409   Lipid Panel     Component Value Date/Time   CHOL 198 01/09/2024 1409   TRIG 67 01/09/2024 1409   HDL 89 01/09/2024 1409   CHOLHDL 2.2 01/09/2024 1409   CHOLHDL 2.7 12/17/2021 0438   VLDL 21 12/17/2021 0438   LDLCALC 97 01/09/2024 1409   LABVLDL 12 01/09/2024 1409    Notable Signs/Symptoms: None  Lifestyle & Dietary Hx Lives by herself and a friend lives with her.  Estimated daily fluid intake: 40 oz Supplements: Vit D Sleep: good Stress / self-care:  Current average weekly physical activity: active around the house.  24-Hr Dietary Recall First Meal: Egg sandwich or boiled  water  Snack: water  Second Meal: Sandwich Snack: eater Third Meal: Baked potato, macaroni salad, Snack: occasional piece of cake Beverages: water , occassional diet sundrop  Estimated Energy Needs Calories: 1500-1800 Carbohydrate: 170g Protein: 112g Fat: 42g   NUTRITION DIAGNOSIS  NB-1.1 Food and nutrition-related knowledge deficit As related to kidney disease.  As evidenced by eGFR 19-21 and Creatine 2.47.   NUTRITION INTERVENTION  Nutrition education (E-1) on the following topics:  Kidney Friendly Diet, s/s of kidney disease and stages, potassium, sodium and phosphorus content, reading food labels.  Handouts Provided Include  Kidney Friendly diet  Learning Style & Readiness for Change Teaching method utilized: Visual & Auditory  Demonstrated degree of understanding via: Teach Back  Barriers to learning/adherence to lifestyle change: None  Goals Established by Pt Goals You're dong a great job! Drink 40 oz of water  per day. Cut back on salt intake Work on whole wheat bread for fiber since your phosphorous is low. Cut back on sugar in tea and switch to water  Read food labels Talk to Dr. Lovie about pros and cons of getting rid of cholesterol medication. Follow kidney guides as discussed.   MONITORING & EVALUATION Dietary intake, weekly physical activity, and weight in  2 months.  Next Steps  Patient is to work on following a kidney friendly diet.Angela Parrish

## 2024-04-14 ENCOUNTER — Telehealth: Payer: Self-pay | Admitting: Family Medicine

## 2024-04-14 NOTE — Telephone Encounter (Unsigned)
 Copied from CRM [id number]. Topic: Clinical - Medication Question >> Apr 14, 2024  8:47 AM Emylou G wrote: Reason for CRM: Patient had her nutrition appt on the 20th.. The dr darden if she still needs to take cholesteral medication?  Pros and Cons etc..

## 2024-04-15 NOTE — Telephone Encounter (Signed)
 Attempted to call but no answer.  No voicemail.  Risks of course of coming off the statin is increased risk of cardiovascular events including stroke and heart attack.  However, given her age, there is no data at this time to support ongoing treatment with statin, most because the over 84 year old population has not been included in the studies.

## 2024-04-23 ENCOUNTER — Other Ambulatory Visit: Payer: Self-pay | Admitting: Family Medicine

## 2024-04-23 DIAGNOSIS — Z1231 Encounter for screening mammogram for malignant neoplasm of breast: Secondary | ICD-10-CM

## 2024-04-28 NOTE — Telephone Encounter (Signed)
 Left detailed message making patient aware of Dr Melba notes and advised for patient to call back if she had additional questions.

## 2024-05-26 ENCOUNTER — Encounter (INDEPENDENT_AMBULATORY_CARE_PROVIDER_SITE_OTHER): Payer: Medicare HMO | Admitting: Ophthalmology

## 2024-05-26 DIAGNOSIS — H43813 Vitreous degeneration, bilateral: Secondary | ICD-10-CM

## 2024-05-26 DIAGNOSIS — H31001 Unspecified chorioretinal scars, right eye: Secondary | ICD-10-CM | POA: Diagnosis not present

## 2024-06-04 DIAGNOSIS — N2581 Secondary hyperparathyroidism of renal origin: Secondary | ICD-10-CM | POA: Diagnosis not present

## 2024-06-04 DIAGNOSIS — N184 Chronic kidney disease, stage 4 (severe): Secondary | ICD-10-CM | POA: Diagnosis not present

## 2024-06-04 DIAGNOSIS — I129 Hypertensive chronic kidney disease with stage 1 through stage 4 chronic kidney disease, or unspecified chronic kidney disease: Secondary | ICD-10-CM | POA: Diagnosis not present

## 2024-06-09 ENCOUNTER — Encounter: Attending: Family Medicine | Admitting: Nutrition

## 2024-06-09 VITALS — Ht 62.0 in | Wt 139.7 lb

## 2024-06-09 DIAGNOSIS — N184 Chronic kidney disease, stage 4 (severe): Secondary | ICD-10-CM | POA: Diagnosis present

## 2024-06-09 NOTE — Progress Notes (Signed)
 Medical Nutrition Therapy  Appointment Start time:  1050  Appointment End time:  1115  Primary concerns today: CKD  Referral diagnosis: N18.4 Preferred learning style: No Preference  Learning readiness: Ready   NUTRITION ASSESSMENT Chronic Kidney Disease Saw new MD at [facility]. Did blood work on the 15th; everything was stable and no changes made. Using Angela Parrish now, logging her foods and cut out processed foods. Choosing healthier foods. Tracking sodium content BP has been good. Has been walking. Reading food labels Asked about her cholesterol medication. Per PCP, no data to support ongoing treatment with statin for her age. She is still taking it. She will follow up at next appointment.    Clinical Medical Hx:  Past Medical History:  Diagnosis Date   Abnormal Pap smear    Previous colposcopy   Abnormal Pap smear of cervix 06/29/2011   Acute pulmonary embolism (HCC) 09/21/2018   Arthritis    hip    Asthma    childhood     Basal cell carcinoma 07/08/2001   Right Forehead (curet, excision)   Breast cancer (HCC) 2000   Status post left lumpectomy   Closed fracture of third toe of right foot 03/13/2017   COPD (chronic obstructive pulmonary disease) (HCC)    Dyspnea    on exertion ; improved with use Spiriva     Headache    occ   Hyperlipidemia    Nonintractable headache 10/08/2017   Personal history of radiation therapy 2000   Right leg DVT (HCC) 09/22/2018   TIA (transient ischemic attack) 12/16/2021    Medications:  Current Outpatient Medications on File Prior to Visit  Medication Sig Dispense Refill   acetaminophen  (TYLENOL ) 325 MG tablet Take 2 tablets (650 mg total) by mouth every 6 (six) hours as needed for mild pain (or Fever >/= 101).     albuterol  (VENTOLIN  HFA) 108 (90 Base) MCG/ACT inhaler INHALE 2 PUFFS INTO THE LUNGS EVERY 6 HOURS AS NEEDED FOR WHEEZING OR SHORTNESS OF BREATH. 18 each 0   aspirin  EC 81 MG EC tablet Take 1 tablet (81  mg total) by mouth daily. Swallow whole. 30 tablet 11   atorvastatin  (LIPITOR) 10 MG tablet Take 1 tablet (10 mg total) by mouth daily. 90 tablet 3   busPIRone  (BUSPAR ) 5 MG tablet Take 1 tablet (5 mg total) by mouth 2 (two) times daily as needed (anxiety). 60 tablet 0   empagliflozin (JARDIANCE) 10 MG TABS tablet Take by mouth daily.     Fluticasone -Umeclidin-Vilant (TRELEGY ELLIPTA ) 200-62.5-25 MCG/ACT AEPB INHALE 1 PUFF EVERY DAY 180 each 3   levalbuterol  (XOPENEX ) 0.63 MG/3ML nebulizer solution Take 3 mLs (0.63 mg total) by nebulization every 6 (six) hours as needed for wheezing or shortness of breath.     Current Facility-Administered Medications on File Prior to Visit  Medication Dose Route Frequency Provider Last Rate Last Admin   denosumab  (PROLIA ) injection 60 mg  60 mg Subcutaneous Q6 months Angela Potter M, DO   60 mg at 01/12/23 0830    Labs:     Latest Ref Rng & Units 01/09/2024    2:09 PM 01/02/2023    8:18 AM 12/16/2021    5:11 PM  CMP  Glucose 70 - 99 mg/dL 86  87  98   BUN 8 - 27 mg/dL 38  33  33   Creatinine 0.57 - 1.00 mg/dL 7.52  7.76  8.21   Sodium 134 - 144 mmol/L 141  142  139  Potassium 3.5 - 5.2 mmol/L 4.7  4.8  5.1   Chloride 96 - 106 mmol/L 101  104  109   CO2 20 - 29 mmol/L 22  22  23    Calcium  8.7 - 10.3 mg/dL 89.5  89.2  8.7   Total Protein 6.0 - 8.5 g/dL 7.4  7.3  7.8   Total Bilirubin 0.0 - 1.2 mg/dL 0.4  0.4  0.5   Alkaline Phos 44 - 121 IU/L 87  97  73   AST 0 - 40 IU/L 26  26  26    ALT 0 - 32 IU/L 17  21  21     Lipid Panel     Component Value Date/Time   CHOL 198 01/09/2024 1409   TRIG 67 01/09/2024 1409   HDL 89 01/09/2024 1409   CHOLHDL 2.2 01/09/2024 1409   CHOLHDL 2.7 12/17/2021 0438   VLDL 21 12/17/2021 0438   LDLCALC 97 01/09/2024 1409   LABVLDL 12 01/09/2024 1409   Lipid Panel     Component Value Date/Time   CHOL 198 01/09/2024 1409   TRIG 67 01/09/2024 1409   HDL 89 01/09/2024 1409   CHOLHDL 2.2 01/09/2024 1409   CHOLHDL  2.7 12/17/2021 0438   VLDL 21 12/17/2021 0438   LDLCALC 97 01/09/2024 1409   LABVLDL 12 01/09/2024 1409    Notable Signs/Symptoms: None  Lifestyle & Dietary Hx Lives by herself and a friend lives with her.  Estimated daily fluid intake: 40 oz Supplements: Vit D Sleep: good Stress / self-care:  Current average weekly physical activity: active around the house.  24-Hr Dietary Recall B) Oatmeal packet, water   L) egg sandwich with tomato on white bread, plum, water  Pear A slice pound cake D)chicken,  vegetables, water   Estimated Energy Needs Calories: 1500-1800 Carbohydrate: 170g Protein: 112g Fat: 42g   NUTRITION DIAGNOSIS  NB-1.1 Food and nutrition-related knowledge deficit As related to kidney disease.  As evidenced by eGFR 19-21 and Creatine 2.47.   NUTRITION INTERVENTION  Nutrition education (E-1) on the following topics:  Kidney Friendly Diet, s/s of kidney disease and stages, potassium, sodium and phosphorus content, reading food labels.  Handouts Provided Include  Kidney Friendly diet  Learning Style & Readiness for Change Teaching method utilized: Visual & Auditory  Demonstrated degree of understanding via: Teach Back  Barriers to learning/adherence to lifestyle change: None  Goals Established by Pt Keep up the great job!! Keep focusing on whole plant based foods for overall health.  MONITORING & EVALUATION Dietary intake, weekly physical activity, and weight in 6 months.  Next Steps  Patient is to work on following a kidney friendly diet.Angela Parrish

## 2024-06-10 ENCOUNTER — Ambulatory Visit
Admission: RE | Admit: 2024-06-10 | Discharge: 2024-06-10 | Disposition: A | Discharge status: Home / Self Care | Source: Ambulatory Visit | Attending: Family Medicine | Admitting: Family Medicine

## 2024-06-10 DIAGNOSIS — Z1231 Encounter for screening mammogram for malignant neoplasm of breast: Secondary | ICD-10-CM | POA: Diagnosis not present

## 2024-06-19 ENCOUNTER — Encounter: Payer: Self-pay | Admitting: Nutrition

## 2024-06-19 NOTE — Patient Instructions (Addendum)
 Keep up the great job! Keep focusing on whole plant based foods for overall health.

## 2024-07-10 ENCOUNTER — Telehealth: Payer: Self-pay

## 2024-07-10 ENCOUNTER — Other Ambulatory Visit: Payer: Self-pay

## 2024-07-10 DIAGNOSIS — M810 Age-related osteoporosis without current pathological fracture: Secondary | ICD-10-CM

## 2024-07-10 MED ORDER — DENOSUMAB 60 MG/ML ~~LOC~~ SOSY: 60.0000 mg | PREFILLED_SYRINGE | SUBCUTANEOUS | Status: DC

## 2024-07-10 NOTE — Telephone Encounter (Signed)
 Sent Prolia  for VOB

## 2024-07-11 ENCOUNTER — Ambulatory Visit: Payer: Self-pay | Admitting: Family Medicine

## 2024-07-11 VITALS — BP 122/59 | HR 64 | Temp 97.4°F | Ht 62.0 in | Wt 141.5 lb

## 2024-07-11 DIAGNOSIS — N184 Chronic kidney disease, stage 4 (severe): Secondary | ICD-10-CM

## 2024-07-11 DIAGNOSIS — Z23 Encounter for immunization: Secondary | ICD-10-CM

## 2024-07-11 DIAGNOSIS — M810 Age-related osteoporosis without current pathological fracture: Secondary | ICD-10-CM

## 2024-07-11 DIAGNOSIS — I10 Essential (primary) hypertension: Secondary | ICD-10-CM

## 2024-07-11 NOTE — Progress Notes (Signed)
 Has been sent for VOB

## 2024-07-11 NOTE — Progress Notes (Signed)
 Subjective: CC: Follow-up CKD PCP: Jolinda Norene HERO, DO YEP:Ojlmj Angela Parrish is a 83 y.o. female presenting to clinic today for:  Hypertension associate with CKD 4 and osteoporosis Patient has had control of blood pressure with Jardiance alone.  She is not having any adverse side effects to the medication.  Continues to follow-up with her nephrologist regularly with last eval in October.  GFR at that time was 21.  She continues to watch her diet and salt extremely closely.  She has worked closely with dietitian and really tries to monitor what she intakes.  She reports no concerns today including no fluid retention, changes in urine output, vaginitis or discharge.  Would like to know when she is supposed to get her next Prolia  shot.  Does note that her insurance is changing next year so she is a little nervous as to what coverage will be like   ROS: Per HPI  No Known Allergies Past Medical History:  Diagnosis Date   Abnormal Pap smear    Previous colposcopy   Abnormal Pap smear of cervix 06/29/2011   Acute pulmonary embolism (HCC) 09/21/2018   Arthritis    hip    Asthma    childhood     Basal cell carcinoma 07/08/2001   Right Forehead (curet, excision)   Breast cancer (HCC) 2000   Status post left lumpectomy   Closed fracture of third toe of right foot 03/13/2017   COPD (chronic obstructive pulmonary disease) (HCC)    Dyspnea    on exertion ; improved with use Spiriva     Headache    occ   Hyperlipidemia    Nonintractable headache 10/08/2017   Personal history of radiation therapy 2000   Right leg DVT (HCC) 09/22/2018   TIA (transient ischemic attack) 12/16/2021    Current Outpatient Medications:    acetaminophen  (TYLENOL ) 325 MG tablet, Take 2 tablets (650 mg total) by mouth every 6 (six) hours as needed for mild pain (or Fever >/= 101)., Disp: , Rfl:    albuterol  (VENTOLIN  HFA) 108 (90 Base) MCG/ACT inhaler, INHALE 2 PUFFS INTO THE LUNGS EVERY 6 HOURS AS NEEDED FOR  WHEEZING OR SHORTNESS OF BREATH., Disp: 18 each, Rfl: 0   aspirin  EC 81 MG EC tablet, Take 1 tablet (81 mg total) by mouth daily. Swallow whole., Disp: 30 tablet, Rfl: 11   atorvastatin  (LIPITOR) 10 MG tablet, Take 1 tablet (10 mg total) by mouth daily., Disp: 90 tablet, Rfl: 3   busPIRone  (BUSPAR ) 5 MG tablet, Take 1 tablet (5 mg total) by mouth 2 (two) times daily as needed (anxiety)., Disp: 60 tablet, Rfl: 0   empagliflozin (JARDIANCE) 10 MG TABS tablet, Take by mouth daily., Disp: , Rfl:    Fluticasone -Umeclidin-Vilant (TRELEGY ELLIPTA ) 200-62.5-25 MCG/ACT AEPB, INHALE 1 PUFF EVERY DAY, Disp: 180 each, Rfl: 3   levalbuterol  (XOPENEX ) 0.63 MG/3ML nebulizer solution, Take 3 mLs (0.63 mg total) by nebulization every 6 (six) hours as needed for wheezing or shortness of breath., Disp: , Rfl:   Current Facility-Administered Medications:    denosumab  (PROLIA ) injection 60 mg, 60 mg, Subcutaneous, Q6 months, Damain Broadus M, DO Social History   Socioeconomic History   Marital status: Significant Other    Spouse name: Arley Silva   Number of children: 1   Years of education: 12   Highest education level: 12th grade  Occupational History   Occupation: Retired    Comment: [employer]  Tobacco Use   Smoking status: Former    Current  packs/day: 0.00    Types: Cigarettes    Quit date: 08/14/2007    Years since quitting: 16.9   Smokeless tobacco: Never  Vaping Use   Vaping status: Never Used  Substance and Sexual Activity   Alcohol use: No   Drug use: No   Sexual activity: Yes    Birth control/protection: Post-menopausal  Other Topics Concern   Not on file  Social History Narrative   Retired from [employer] age 68   She has 1 daughter who is 95 years old and lives in [place])   She has 2 grandsons age 46 and 73.   She has a significant other, It Sales Professional.     No pets   She loves crafting and thrift stores   Social Drivers of Health   Financial Resource Strain: Low Risk   (02/20/2024)   Overall Financial Resource Strain (CARDIA)    Difficulty of Paying Living Expenses: Not hard at all  Food Insecurity: Unknown (07/11/2024)   Hunger Vital Sign    Worried About Running Out of Food in the Last Year: Never true    Ran Out of Food in the Last Year: Not on file  Transportation Needs: No Transportation Needs (07/11/2024)   PRAPARE - Administrator, Civil Service (Medical): No    Lack of Transportation (Non-Medical): No  Physical Activity: Insufficiently Active (02/20/2024)   Exercise Vital Sign    Days of Exercise per Week: 3 days    Minutes of Exercise per Session: 30 min  Stress: No Stress Concern Present (07/11/2024)   [employer] of Occupational Health - Occupational Stress Questionnaire    Feeling of Stress: Not at all  Social Connections: Unknown (07/11/2024)   Social Connection and Isolation Panel    Frequency of Communication with Friends and Family: More than three times a week    Frequency of Social Gatherings with Friends and Family: Once a week    Attends Religious Services: More than 4 times per year    Active Member of [employer] or Organizations: Yes    Attends Banker Meetings: More than 4 times per year    Marital Status: Not on file  Intimate Partner Violence: Not At Risk (02/20/2024)   Humiliation, Afraid, Rape, and Kick questionnaire    Fear of Current or Ex-Partner: No    Emotionally Abused: No    Physically Abused: No    Sexually Abused: No   Family History  Problem Relation Age of Onset   Heart disease Father    Lung cancer Father        smoker   Heart attack Father    Colon cancer Mother 61   Heart attack Mother    Heart disease Sister    Hypertension Brother    Heart attack Brother    Leukemia Brother    Obesity Daughter    Anxiety disorder Daughter    Hypertension Brother     Objective: Office vital signs reviewed. BP (!) 122/59   Pulse 64   Temp (!) 97.4 F (36.3 C)   Ht 5' 2 (1.575 m)    Wt 141 lb 8 oz (64.2 kg)   SpO2 100%   BMI 25.88 kg/m   Physical Examination:  General: Awake, alert, well-appearing female, No acute distress HEENT: Sclera white.  Moist mucous membranes Cardio: regular rate and rhythm, S1S2 heard, no murmurs appreciated Pulm: clear to auscultation bilaterally, no wheezes, rhonchi or rales; normal work of breathing on room air Extremities:  warm, well perfused, No edema, cyanosis or clubbing; +2 pulses bilaterally    Assessment/ Plan: 83 y.o. female   Chronic kidney disease, stage 4 (severe) (HCC)  Essential hypertension  Encounter for immunization - Plan: Flu vaccine HIGH DOSE PF(Fluzone Trivalent)  Age-related osteoporosis without current pathological fracture   I reviewed labs by her nephrologist and these were not repeated today.  She was given her influenza vaccination today.  No red flag signs or symptoms with Jardiance.  Blood pressure controlled  I have reached out to Prolia  team to get her set up for injection as she is due in about 3 weeks.  Discussed that we will not know what her coverage is like until we are ready to resubmit in 6 months for her follow-up injection   Isayah Ignasiak CHRISTELLA Fielding, DO [facility] [phone]

## 2024-07-22 ENCOUNTER — Telehealth: Payer: Self-pay

## 2024-07-22 NOTE — Telephone Encounter (Signed)
 Prolia VOB initiated via [email or web address]  Next Prolia inj DUE: 09/16/23

## 2024-07-23 ENCOUNTER — Other Ambulatory Visit (HOSPITAL_COMMUNITY): Payer: Self-pay

## 2024-07-23 NOTE — Telephone Encounter (Signed)
 Pt ready for scheduling for PROLIA  on or after : 07/26/24  Option# 1: Buy/Bill (Office supplied medication)  Out-of-pocket cost due at time of clinic visit: $357  Number of injection/visits approved: 2  Primary: [employer] Prolia  co-insurance: 20% Admin fee co-insurance: $25  Secondary: --- Prolia  co-insurance:  Admin fee co-insurance:   Medical Benefit Details: Date Benefits were checked: 07/23/24 Deductible: NO/ Coinsurance: 20%/ Admin Fee: $25  Prior Auth: APPROVED PA# [id number] Expiration Date: 08/22/23-08/20/24  # of doses approved: 2 ----------------------------------------------------------------------- Option# 2- Med Obtained from pharmacy:  Pharmacy benefit: Copay $0 (Paid to pharmacy) Admin Fee: $25 (Pay at clinic)  Prior Auth: N/A PA# Expiration Date:   # of doses approved:   If patient wants fill through the pharmacy benefit please send prescription to: [facility], and include estimated need by date in rx notes. Pharmacy will ship medication directly to the office.  Patient NOT eligible for Prolia  Copay Card. Copay Card can make patient's cost as little as $25. Link to apply: [email or web address]://[email or web address]  ** This summary of benefits is an estimation of the patient's out-of-pocket cost. Exact cost may very based on individual plan coverage.

## 2024-07-23 NOTE — Telephone Encounter (Signed)
 SABRA

## 2024-07-28 ENCOUNTER — Other Ambulatory Visit: Payer: Self-pay

## 2024-07-28 DIAGNOSIS — M810 Age-related osteoporosis without current pathological fracture: Secondary | ICD-10-CM

## 2024-07-28 MED ORDER — DENOSUMAB 60 MG/ML ~~LOC~~ SOSY
60.0000 mg | PREFILLED_SYRINGE | Freq: Once | SUBCUTANEOUS | 0 refills | Status: AC
  Filled 2024-07-28: qty 1, 180d supply, fill #0

## 2024-07-28 NOTE — Progress Notes (Signed)
 See benefits inv encounter from 07/22/24. Copay is $0.

## 2024-07-28 NOTE — Progress Notes (Signed)
 Specialty Pharmacy Initial Fill Coordination Note  [name] is a 83 y.o. female contacted today regarding initial fill of specialty medication(s) Denosumab  (PROLIA )   Patient requested Courier to Provider Office   Delivery date: 07/30/24   Verified address: [facility]-[address]   Medication will be filled on: 07/29/24   Patient is aware of $0 copayment.

## 2024-07-31 ENCOUNTER — Ambulatory Visit

## 2024-08-01 ENCOUNTER — Ambulatory Visit (INDEPENDENT_AMBULATORY_CARE_PROVIDER_SITE_OTHER): Admitting: *Deleted

## 2024-08-01 DIAGNOSIS — M810 Age-related osteoporosis without current pathological fracture: Secondary | ICD-10-CM | POA: Diagnosis not present

## 2024-08-01 MED ORDER — DENOSUMAB 60 MG/ML ~~LOC~~ SOSY
60.0000 mg | PREFILLED_SYRINGE | Freq: Once | SUBCUTANEOUS | Status: AC
  Administered 2024-08-01: 60 mg via SUBCUTANEOUS

## 2024-08-01 NOTE — Progress Notes (Signed)
 Patient is in office today for a nurse visit for Prolia  injection. Injection given subcutaneous in the right arm. Patient tolerated well.

## 2024-09-05 NOTE — Progress Notes (Signed)
 Angela Parrish                                          MRN: [id number]   09/05/2024   The [facility]I Quality Team Specialist reviewed this patient medical record for the purposes of chart review for care gap closure. The following were reviewed: abstraction for care gap closure-care for older adult medication review.    VBCI Quality Team

## 2024-11-03 ENCOUNTER — Encounter: Admitting: Nutrition

## 2025-02-23 ENCOUNTER — Ambulatory Visit: Payer: Self-pay

## 2025-03-03 ENCOUNTER — Encounter: Admitting: Family Medicine

## 2025-05-28 ENCOUNTER — Encounter (INDEPENDENT_AMBULATORY_CARE_PROVIDER_SITE_OTHER): Admitting: Ophthalmology
